# Patient Record
Sex: Female | Born: 1950
Health system: Southern US, Community
[De-identification: ages and names within clinical notes are randomized; demographics above are authoritative.]

## PROBLEM LIST (undated history)

## (undated) DIAGNOSIS — I10 Essential (primary) hypertension: Secondary | ICD-10-CM

## (undated) DIAGNOSIS — F32A Depression, unspecified: Secondary | ICD-10-CM

## (undated) DIAGNOSIS — D649 Anemia, unspecified: Secondary | ICD-10-CM

## (undated) DIAGNOSIS — M1990 Unspecified osteoarthritis, unspecified site: Secondary | ICD-10-CM

## (undated) DIAGNOSIS — K5900 Constipation, unspecified: Secondary | ICD-10-CM

## (undated) DIAGNOSIS — F419 Anxiety disorder, unspecified: Secondary | ICD-10-CM

## (undated) DIAGNOSIS — K219 Gastro-esophageal reflux disease without esophagitis: Secondary | ICD-10-CM

## (undated) DIAGNOSIS — F329 Major depressive disorder, single episode, unspecified: Secondary | ICD-10-CM

## (undated) HISTORY — PX: REFRACTIVE SURGERY: SHX103

## (undated) HISTORY — DX: Gastro-esophageal reflux disease without esophagitis: K21.9

## (undated) HISTORY — DX: Depression, unspecified: F32.A

## (undated) HISTORY — DX: Anxiety disorder, unspecified: F41.9

## (undated) HISTORY — DX: Essential (primary) hypertension: I10

## (undated) HISTORY — DX: Unspecified osteoarthritis, unspecified site: M19.90

## (undated) HISTORY — DX: Major depressive disorder, single episode, unspecified: F32.9

## (undated) HISTORY — DX: Constipation, unspecified: K59.00

---

## 2000-05-09 ENCOUNTER — Other Ambulatory Visit: Admission: RE | Admit: 2000-05-09 | Discharge: 2000-05-09 | Payer: Self-pay | Admitting: General Surgery

## 2000-06-27 ENCOUNTER — Encounter: Payer: Self-pay | Admitting: Obstetrics and Gynecology

## 2000-06-27 ENCOUNTER — Ambulatory Visit (HOSPITAL_COMMUNITY): Admission: RE | Admit: 2000-06-27 | Discharge: 2000-06-27 | Payer: Self-pay | Admitting: Obstetrics and Gynecology

## 2000-11-05 ENCOUNTER — Encounter: Payer: Self-pay | Admitting: Obstetrics and Gynecology

## 2000-11-05 ENCOUNTER — Ambulatory Visit (HOSPITAL_COMMUNITY): Admission: RE | Admit: 2000-11-05 | Discharge: 2000-11-05 | Payer: Self-pay | Admitting: Obstetrics and Gynecology

## 2000-11-12 ENCOUNTER — Other Ambulatory Visit: Admission: RE | Admit: 2000-11-12 | Discharge: 2000-11-12 | Payer: Self-pay | Admitting: Obstetrics and Gynecology

## 2001-03-20 ENCOUNTER — Other Ambulatory Visit: Admission: RE | Admit: 2001-03-20 | Discharge: 2001-03-20 | Payer: Self-pay | Admitting: General Surgery

## 2003-03-15 ENCOUNTER — Ambulatory Visit (HOSPITAL_COMMUNITY): Admission: RE | Admit: 2003-03-15 | Discharge: 2003-03-15 | Payer: Self-pay | Admitting: Obstetrics and Gynecology

## 2003-11-30 ENCOUNTER — Ambulatory Visit (HOSPITAL_COMMUNITY): Admission: RE | Admit: 2003-11-30 | Discharge: 2003-11-30 | Payer: Self-pay | Admitting: Obstetrics & Gynecology

## 2004-01-02 HISTORY — PX: COLON SURGERY: SHX602

## 2004-01-02 HISTORY — PX: COLONOSCOPY: SHX174

## 2004-01-27 ENCOUNTER — Ambulatory Visit (HOSPITAL_COMMUNITY): Admission: RE | Admit: 2004-01-27 | Discharge: 2004-01-27 | Payer: Self-pay | Admitting: Family Medicine

## 2004-02-15 ENCOUNTER — Ambulatory Visit: Payer: Self-pay | Admitting: Internal Medicine

## 2004-03-06 ENCOUNTER — Ambulatory Visit: Payer: Self-pay | Admitting: Internal Medicine

## 2004-03-06 ENCOUNTER — Ambulatory Visit (HOSPITAL_COMMUNITY): Admission: RE | Admit: 2004-03-06 | Discharge: 2004-03-06 | Payer: Self-pay | Admitting: Internal Medicine

## 2004-05-02 ENCOUNTER — Other Ambulatory Visit: Admission: RE | Admit: 2004-05-02 | Discharge: 2004-05-02 | Payer: Self-pay | Admitting: Obstetrics and Gynecology

## 2004-10-29 ENCOUNTER — Emergency Department (HOSPITAL_COMMUNITY): Admission: EM | Admit: 2004-10-29 | Discharge: 2004-10-29 | Payer: Self-pay | Admitting: Emergency Medicine

## 2004-11-01 ENCOUNTER — Ambulatory Visit (HOSPITAL_COMMUNITY): Admission: RE | Admit: 2004-11-01 | Discharge: 2004-11-01 | Payer: Self-pay | Admitting: Obstetrics & Gynecology

## 2004-11-02 ENCOUNTER — Inpatient Hospital Stay (HOSPITAL_COMMUNITY): Admission: AD | Admit: 2004-11-02 | Discharge: 2004-11-07 | Payer: Self-pay | Admitting: General Surgery

## 2004-11-03 ENCOUNTER — Encounter: Payer: Self-pay | Admitting: General Surgery

## 2004-12-04 ENCOUNTER — Inpatient Hospital Stay (HOSPITAL_COMMUNITY): Admission: RE | Admit: 2004-12-04 | Discharge: 2004-12-08 | Payer: Self-pay | Admitting: General Surgery

## 2004-12-04 ENCOUNTER — Encounter (INDEPENDENT_AMBULATORY_CARE_PROVIDER_SITE_OTHER): Payer: Self-pay | Admitting: General Surgery

## 2009-02-09 ENCOUNTER — Emergency Department (HOSPITAL_COMMUNITY): Admission: EM | Admit: 2009-02-09 | Discharge: 2009-02-09 | Payer: Self-pay | Admitting: Emergency Medicine

## 2010-01-21 ENCOUNTER — Encounter: Payer: Self-pay | Admitting: Obstetrics and Gynecology

## 2010-01-21 ENCOUNTER — Encounter: Payer: Self-pay | Admitting: Otolaryngology

## 2010-01-22 ENCOUNTER — Encounter: Payer: Self-pay | Admitting: Obstetrics and Gynecology

## 2010-03-13 ENCOUNTER — Encounter: Payer: Self-pay | Admitting: Orthopedic Surgery

## 2010-03-22 ENCOUNTER — Encounter: Payer: Self-pay | Admitting: Orthopedic Surgery

## 2010-03-29 ENCOUNTER — Ambulatory Visit: Payer: Self-pay | Admitting: Orthopedic Surgery

## 2010-03-30 ENCOUNTER — Ambulatory Visit: Payer: Self-pay | Admitting: Orthopedic Surgery

## 2010-05-19 NOTE — Consult Note (Signed)
NAME:  Alexandra Hoffman, Alexandra Hoffman                ACCOUNT NO.:  [id number]   MEDICAL RECORD NO.:  [id number]           PATIENT TYPE:   LOCATION:                                 FACILITY:   PHYSICIAN:  R. Roetta Sessions, M.D. DATE OF BIRTH:  [birth year]   DATE OF CONSULTATION:  DATE OF DISCHARGE:                                   CONSULTATION   REQUESTING PHYSICIAN:  Kirk Ruths, M.D.   REASON FOR CONSULTATION:  Colonoscopy.   HISTORY OF PRESENT ILLNESS:  Alexandra Hoffman is a 60 year old Caucasian female who  presents with a two-month history of left lower quadrant bloating and  pressure. She has history of chronic constipation and has always had to use  something for bowel movements. More recently, she is using milk of magnesia  every other day. She typically has a bowel movement between every one and  three days. She is also complaining of abdominal bloating and flatulence  which has been chronic problem for her as well. She denies any rectal  bleeding or melena. She notes her symptoms are typically worse  postprandially. She also describes worsening of her symptoms of bloating and  flatulence with particular foods including milk products. She has been tried  on Prevacid as it was felt her symptoms could be related to GERD; although  she underwent a three-month trial, it really did not change her symptoms.  Otherwise, she denies any heartburn, indigestion, nausea, vomiting,  dysphagia, or odynophagia.   PAST MEDICAL HISTORY:  She had EGD by Dr. Lovell Sheehan about five to six years  ago which was reportedly normal.   PAST SURGICAL HISTORY:  Denies.   CURRENT MEDICATIONS:  1.  Xanax 0.5 mg at bedtime.  2.  Milk of magnesia on a p.r.n. basis.   ALLERGIES:  No known drug allergies.   FAMILY HISTORY:  Denies any family history of colorectal carcinoma, liver,  or chronic GI problems. Both mother and father are deceased at age 61 and 91  with history significant for diabetes, coronary artery  disease,  hypertension, and brain cancer. She has multiple siblings, all of whom are  healthy.   SOCIAL HISTORY:  Alexandra Hoffman is currently in her second marriage for the last  10 years. She has two grown healthy children. She is employed full time as a  Producer, television/film/video. She denies any tobacco, alcohol, or drug use.   REVIEW OF SYSTEMS:  CONSTITUTIONAL:  She denies any fever or chills. Denies  any anorexia. She denies any fatigue, and her weight has remained stable.  GASTROINTESTINAL:  See HPI. GYNECOLOGICAL:  She has had one abnormal Pap  smear and is being followed by a gynecologist for this. She is  postmenopausal. ENDOCRINE:  She denies any history of diabetes mellitus or  thyroid disease.   PHYSICAL EXAMINATION:  VITAL SIGNS:  Weight 212.5 pounds, temperature 98.6,  blood pressure 140/100, pulse 60.  GENERAL:  Alexandra Hoffman is an obese, Caucasian female who is alert, oriented,  pleasant, cooperative in no acute distress.  HEENT:  Sclerae are clear, nonicteric. Conjunctivae pink. Oropharynx is pink  and moist without any lesions.  NECK:  Supple without any mass or thyromegaly.  CHEST:  Heart regular rate and rhythm with normal S1 and S2. Lungs clear to  auscultation bilaterally.  ABDOMEN:  Protuberant and obese with decreased bowel sounds x4, soft,  nontender, nondistended without palpable mass or hepatosplenomegaly. No  rebound tenderness or guarding, although exam is limited given patient's  body habitus.  RECTAL:  Deferred.  EXTREMITIES:  2+ pedal pulses bilaterally. No edema.  SKIN:  Pink, warm, and dry without any rash or jaundice.   IMPRESSION:  Alexandra Hoffman is a 60 year old Caucasian female with chronic  constipation along with some left lower quadrant abdominal pressure and  bloating over the last two months. She also notes her symptoms are worse  with milk products and may have a lactose intolerance. Not noted above, she  underwent CT scan of her abdomen with contrast on November 30, 2003; she was  found to have diverticulosis without evidence of acute diverticulitis.  Pelvic ultrasound from January 26 revealed normal endometrial stripe, 2.8 x  1.9 x 2.6 intramural fibroid, and otherwise normal exam.   She does have diverticulosis and possibly could have underlying  diverticulitis, although her symptoms are more along the lines of lactose  intolerance and possibly irritable bowel syndrome. Given her age,  colonoscopy should be undertaken to rule out colorectal carcinoma as well.   RECOMMENDATIONS:  1.  Will provide diverticulosis literature.  2.  Recommended fiber supplementation. We have given her samples of      Citrucel.  3.  Will schedule colonoscopy with Dr. Jena Gauss in the near future. I have      discussed this procedure including the risks and benefits which include      but are not limited to bleeding, infection, perforation, drug reaction.      She agrees with the plan.  4.  She should follow up with Dr. Regino Schultze regarding her high blood pressure      today which is 140/100.   We would like to thank Dr. Regino Schultze for allowing [place] to participate in the  care Alexandra Hoffman.     KC/MEDQ  D:  02/15/2004  T:  02/15/2004  Job:  [id number]   cc:   Kirk Ruths, M.D.  [address]  [place]  [place] [address]  Fax: [phone]

## 2010-05-19 NOTE — Discharge Summary (Signed)
NAME:  Alexandra Hoffman, Alexandra Hoffman                ACCOUNT NO.:  [id number]   MEDICAL RECORD NO.:  [id number]          PATIENT TYPE:  INP   LOCATION:  A336                          FACILITY:  [facility]   PHYSICIAN:  Dalia Heading, M.D.  DATE OF BIRTH:  [birth year]   DATE OF ADMISSION:  12/04/2004  DATE OF DISCHARGE:  12/08/2006LH                                 DISCHARGE SUMMARY   HOSPITAL COURSE SUMMARY:  The patient is a 60 year old white female who  presented to the operating room for a laparoscopic cholecystectomy and  partial colectomy. She suffered from chronic cholecystitis as well as a  history of perforated sigmoid diverticulitis. She underwent a laparoscopic  cholecystectomy and partial colectomy and partial sigmoid colectomy on  December 04, 2004. She tolerated both procedures well. Her postoperative  course was for the most part unremarkable. Her diet was advanced without  difficulty once her bowel function returned.   The patient is being discharged home on December 08, 2004 in good and  improving condition.   DISCHARGE INSTRUCTIONS:  The patient is to follow up with Dr. Franky Macho  on December 12, 2004. Discharge medications include Vicodin 1-2 tablets p.o.  q.4h. p.r.n. pain. She is resume all her other medications as previously  prescribed.   PRINCIPAL DIAGNOSIS:  1.  History of perforated sigmoid diverticulitis.  2.  Chronic cholecystitis.  3.  Reflux disease.   PRINCIPAL PROCEDURE:  Laparoscopic cholecystectomy, sigmoid colectomy on  December 04, 2004.      Dalia Heading, M.D.  Electronically Signed     MAJ/MEDQ  D:  12/08/2004  T:  12/08/2004  Job:  [id number]   cc:   Kirk Ruths, M.D.  Fax: [phone]   Tilda Burrow, M.D.  Fax: [phone]

## 2010-05-19 NOTE — Op Note (Signed)
NAME:  Alexandra Hoffman, Alexandra Hoffman                ACCOUNT NO.:  [id number]   MEDICAL RECORD NO.:  [id number]          PATIENT TYPE:  AMB   LOCATION:  DAY                           FACILITY:  [facility]   PHYSICIAN:  R. Roetta Sessions, M.D. DATE OF BIRTH:  11/23/1950   DATE OF PROCEDURE:  03/05/2004  DATE OF DISCHARGE:                                 OPERATIVE REPORT   PROCEDURE:  Colonoscopy, snare polypectomy with biopsy.   INDICATION FOR PROCEDURE:  The patient is a 59 year old lady with left lower  quadrant abdominal pain.  Symptoms have been much improved with a course of  Citrucel one dose daily.  She has never had a colonoscopy.  Colonoscopy is  now being done.  This approach has been discussed with the patient at  length, the potential risks, benefits, and alternatives have been reviewed,  questions answered.  She is agreeable.  Please see documentation in the  medical record.   PROCEDURE NOTE:  O2 saturation, blood pressure, pulse, and respiration were  monitored throughout the entire procedure.   CONSCIOUS SEDATION:  Versed 5 mg IV, Demerol 100 mg IV in divided doses.   INSTRUMENT USED:  Olympus video chip system.   FINDINGS:  Digital examination revealed no abnormalities.  Endoscopic  findings:  Prep was good.   Rectum:  Examination of the rectal mucosa including a retroflexed view of  the anal verge revealed no abnormalities.   Colon:  The colonic mucosa was surveyed from the rectosigmoid junction  through the left, transverse, right colon to the area of the appendiceal  orifice, ileocecal valve and cecum.  These structures were well-seen and  photographed for the record.  From this level the scope was slowly withdrawn  and all previous-mentioned mucosal surfaces were again seen.  There was a 4  mm sessile polyp at the base of the cecum which was cold biopsied/removed.  The patient also had sigmoid diverticula and a 7 mm angry pedunculated polyp  at 30 cm, which was removed with snare  cautery and recovered through the  scope.  The remainder of the colonic mucosa appeared normal.  The patient  tolerated the procedure well and was reacted in endoscopy.   IMPRESSION:  1.  Normal rectum and a few sigmoid diverticula.  2.  Pedunculated polyp at 30 cm, resected with snare.  3.  Polyp in the base of the cecum cold biopsied/removed.  4.  The remainder of the colonic mucosa appeared normal.   RECOMMENDATIONS:  1.  Continue Citrucel daily fiber supplement, as this seems to be really      helping this lady significant.  2.  Diverticulosis literature.  3.  No aspirin or arthritis medications for 10 days.  4.  Follow up on pathology.  5.  Further recommendations to follow.      RMR/MEDQ  D:  03/06/2004  T:  03/06/2004  Job:  [id number]   cc:   Kirk Ruths, M.D.  [address]  [place]  [place] [address]  Fax: [phone]

## 2010-05-19 NOTE — Discharge Summary (Signed)
NAME:  Alexandra Hoffman, Alexandra Hoffman                ACCOUNT NO.:  [id number]   MEDICAL RECORD NO.:  [id number]          PATIENT TYPE:  INP   LOCATION:  A322                          FACILITY:  [facility]   PHYSICIAN:  Dalia Heading, M.D.  DATE OF BIRTH:  16-Jan-1950   DATE OF ADMISSION:  11/02/2004  DATE OF DISCHARGE:  11/07/2006LH                                 DISCHARGE SUMMARY   HOSPITAL COURSE:  The patient is a 60 year old, white female who was  admitted to [facility] for treatment of pelvic abscess secondary to  perforated sigmoid diverticulitis.  She was started on Zosyn and Flagyl.  She subsequently underwent CT-guided drainage of the pelvic abscess on  November 03, 2004.  A drain was left in place.  Her white count subsequently  improved.  A follow-up CAT scan was done on November 07, 2004, and her  abscessed cavity is almost resolved.  Her diverticulitis also looks  improved.  The patient is tolerating diet well.  The drain was removed and  the patient is being discharged home in good and improving condition.   FOLLOW UP:  The patient to follow-up with Dr. Franky Macho on November 14, 2004.   DISCHARGE MEDICATIONS:  1.  Augmentin 875 mg p.o. b.i.d. x1 week.  2.  Flagyl 250 mg p.o. t.i.d. x1 week.  3.  She is to resume other medications as previously prescribed.   DISCHARGE DIAGNOSES:  1.  Pelvic abscess.  2.  Perforated sigmoid diverticulitis.  3.  History of anxiety disorder.   PRINCIPAL PROCEDURE:  None.      Dalia Heading, M.D.  Electronically Signed     MAJ/MEDQ  D:  11/07/2004  T:  11/07/2004  Job:  [id number]   cc:   Tilda Burrow, M.D.  Fax: [phone]   Kirk Ruths, M.D.  Fax: [phone]

## 2010-05-19 NOTE — Op Note (Signed)
NAME:  Alexandra Hoffman, Alexandra Hoffman                ACCOUNT NO.:  [id number]   MEDICAL RECORD NO.:  [id number]          PATIENT TYPE:  INP   LOCATION:  A336                          FACILITY:  [facility]   PHYSICIAN:  Dalia Heading, M.D.  DATE OF BIRTH:  1950-04-24   DATE OF PROCEDURE:  12/04/2004  DATE OF DISCHARGE:                                 OPERATIVE REPORT   PREOPERATIVE DIAGNOSES:  1.  Chronic cholecystitis.  2.  Sigmoid diverticulitis.   POSTOPERATIVE DIAGNOSES:  1.  Chronic cholecystitis.  2.  Sigmoid diverticulitis.   PROCEDURE:  Partial colectomy, laparoscopic cholecystectomy.   SURGEON:  Franky Macho, M.D.   ASSISTANT:  Arna Snipe, M.D.   ANESTHESIA:  General endotracheal.   INDICATIONS:  The patient is a 60 year old white female who presents with  history of a perforated sigmoid diverticulitis as well as chronic  cholecystitis.  Her intra-abdominal abscess was drained percutaneously  approximately six weeks ago. The patient now comes to the operating for both  a partial sigmoid colectomy as well as laparoscopic cholecystectomy for  chronic cholecystitis.  The risks and benefits of the procedures including  bleeding, infection, hepatobiliary, cardiopulmonary difficulties,  possibility of a blood transfusion were fully explained to the patient who  gave informed consent.   DESCRIPTION OF PROCEDURE:  The patient was placed in the supine position.  After induction of general endotracheal anesthesia, the abdomen and perineum  were prepped and draped using the usual sterile technique with Betadine.  Surgical site confirmation was performed.   We first performed a laparoscopic cholecystectomy.  A supraumbilical  incision was made down to the fascia.  A Veress needle was introduced into  the abdominal cavity,  and confirmation of placement was done using the  saline drop test. The abdomen was then insufflated to 16 mmHg pressure.  An  11 mm trocar was introduced into the  abdominal cavity under direct  visualization without difficulty.  The patient was placed in reversed  Trendelenburg position. An additional 11-mm trocar was placed in suprapubic  region and 5-mm trocars was placed in the right upper quadrant and right  flank regions. The liver was inspected and noted to be within normal limits.  The gallbladder was retracted superior laterally.  The dissection was begun  around the infundibulum of the gallbladder.  The cystic duct was first  identified.  Its juncture to the infundibulum fully identified.  The  Endoclips were placed proximally and distally on cystic duct, and the cystic  duct was divided.  This likewise was done to the cystic artery.  The  gallbladder was then freed away from the gallbladder fossa using Bovie  electrocautery.  The gallbladder was delivered through the epigastric trocar  site using the EndoCatch bag.  The gallbladder fossa was inspected.  No  abnormal bleeding or bile leakage was noted. Surgicel was placed in the  gallbladder fossa.  All fluid and air were then evacuated from the abdominal  cavity prior to removal of the trocars.   Next, a lower midline incision was made to proceed with the sigmoid  colectomy. The dissection  was taken down to the fascia, and the peritoneal  cavity was entered into without difficulty.  On inspection, a redundant area  of distal sigmoid colon was noted to be adhesed to the area between the  uterus and bladder wall.  This was freed away bluntly without difficulty.  The mesentery of the sigmoid colon was mobilized down to the colorectal  region.  The peritoneal reflection was likewise dissected up to the splenic  flexure.  A GIA stapler was placed across the distal sigmoid colon and  fired.  This was likewise done to the proximal sigmoid colon.  A suture was  placed to mark the proximal sigmoid colon region.  The mesentery then was  divided using 2-0 silk ties.  The specimens then removed from  the operative  field.  A side-to-side colorectal anastomosis was then performed using a GIA  stapler.  The staple line of the enterotomy colotomy was closed using a TA  66 stapler.  The staple line was bolstered using 3-0 silk Lembert sutures.  Excess adipose tissue was also used to cover the anastomosis.  A widely  patent anastomosis was found.  Care was taken to avoid the left ureter.  The  abdominal cavity was then copiously irrigated with normal saline.  Surgicel  was placed where the was raw  tissue where the previous perforation had  occurred.  The abdominal cavity contents were then returned into the  abdominal fat cavity in an orderly fashion.   The fascia was reapproximated using a looped O Novofil running suture.  Subcutaneous layer was reapproximated using 2-0 Vicryl interrupted sutures.  The subcutaneous layer was copiously irrigated normal saline. The skin was  closed using staples. Betadine ointment and dry sterile dressings were  applied.   All tape and needle counts were correct and the end of the procedure.  The  patient was extubated in the upper room and went back to recovery room awake  in stable condition.   COMPLICATIONS:  None.   SPECIMEN:  1.  Gallbladder.  2.  Sigmoid colon suture proximal.   ESTIMATED BLOOD LOSS:  100 mL.      Dalia Heading, M.D.  Electronically Signed     MAJ/MEDQ  D:  12/04/2004  T:  12/05/2004  Job:  [id number]   cc:   Tilda Burrow, M.D.  Fax: [phone]   Kirk Ruths, M.D.  Fax: [phone]

## 2010-05-19 NOTE — H&P (Signed)
NAME:  Alexandra Hoffman, Alexandra Hoffman                ACCOUNT NO.:  [id number]   MEDICAL RECORD NO.:  [id number]          PATIENT TYPE:  INP   LOCATION:  A322                          FACILITY:  [facility]   PHYSICIAN:  Dalia Heading, M.D.  DATE OF BIRTH:  1950-06-17   DATE OF ADMISSION:  11/02/2004  DATE OF DISCHARGE:  LH                                HISTORY & PHYSICAL   CHIEF COMPLAINT:  Diverticular abscess.   HISTORY OF PRESENT ILLNESS:  The patient is a 60 year old white female who  has a known history of diverticular disease who presented to Dr. Rayna Sexton  office two weeks ago with symptoms of urinary tract infection.  She was  started on appropriate antibiotics.  I saw her in my office earlier this  week.  She was complaining of epigastric discomfort.  She has a known  history of chronic cholecystitis with a low gallbladder ejection fraction.  At that time, I felt that we needed to wait until her urinary tract symptoms  resolved.  She saw Dr. Despina Hidden yesterday who on pelvic examination thought that  she had fullness in the pelvis.  An ultrasound and a CT scan of the abdomen  and pelvis were performed which revealed evidence of multiple pelvic  abscesses consistent with perforated diverticulosis.  She did not have  peritoneal signs.   I saw her in my office today, and I have recommended that she be admitted to  the hospital for intravenous antibiotic therapy and CT-guided drainage of  the pelvic abscesses.   PAST MEDICAL HISTORY:  1.  Diverticulosis.  2.  Chronic dyspepsia.   PAST SURGICAL HISTORY:  Colonoscopy by Dr. Jena Gauss earlier this year which  showed several sigmoid diverticula.  Polypectomy was done at the base of the  cecum which was negative for malignancy.  Another polyp was located at 30 cm  from the anus, and this was also negative for malignancy.   CURRENT MEDICATIONS:  1.  Ciprofloxacin.  2.  Prevacid.  3.  Xanax.   ALLERGIES:  NO KNOWN DRUG ALLERGIES.   REVIEW OF SYSTEMS:   Noncontributory.   PHYSICAL EXAMINATION:  GENERAL:  The patient is a well-developed, well-  nourished white female in no acute distress.  LUNGS:  Clear to auscultation with equal breath sounds bilaterally.  HEART:  Regular rate and rhythm without S3, S4, or murmurs.  ABDOMEN:  Soft, nontender, nondistended.  No hepatosplenomegaly or masses  were noted.  Minimal discomfort is noted in the suprapubic region.  It is  not exquisitely tender.  The patient describes it just sort of as  intermittent crampy abdominal pain.   LABORATORY DATA:  White blood cell count done as an outpatient yesterday was  10.9, hematocrit 43, platelet count 308.  MET7 is within normal limits.  Liver enzyme tests were within normal limits.   IMPRESSION:  Pelvic abscesses secondary to sigmoid diverticular disease.   PLAN:  The patient is being admitted to [facility] for intravenous  antibiotic therapy.  She will be transferred to [facility]  tomorrow to undergo a CT-guided drainage of  these abscesses.  It has been  explained that if these cannot be drained or do not resolve, surgical  intervention may be warranted.  Further management is pending the CT  drainage results.      Dalia Heading, M.D.  Electronically Signed     MAJ/MEDQ  D:  11/02/2004  T:  11/02/2004  Job:  [id number]   cc:   Kirk Ruths, M.D.  Fax: [phone]   Tilda Burrow, M.D.  Fax: [phone]

## 2010-05-19 NOTE — H&P (Signed)
NAME:  Alexandra Hoffman, Alexandra Hoffman                ACCOUNT NO.:  [id number]   MEDICAL RECORD NO.:  [id number]          PATIENT TYPE:  AMB   LOCATION:                                FACILITY:  [facility]   PHYSICIAN:  Dalia Heading, M.D.  DATE OF BIRTH:  Mar 20, 1950   DATE OF ADMISSION:  DATE OF DISCHARGE:  LH                                HISTORY & PHYSICAL   CHIEF COMPLAINT:  Chronic cholecystitis and perforated sigmoid  diverticulitis.   HISTORY OF PRESENT ILLNESS:  The patient is a 60 year old white female  status post CT-guided drainage of a pelvic abscess due to perforated sigmoid  diverticulitis who now presents for a partial colectomy and cholecystectomy.  Her initial drainage was done on November 03, 2004.  She has done well since  that time.  She had a colonoscopy earlier this year by Dr. Jena Gauss, which  revealed several sigmoid diverticula.  She denies any fever, chills,  diarrhea or constipation.   PAST MEDICAL HISTORY:  As noted above.   PAST SURGICAL HISTORY:  As noted above.   CURRENT MEDICATIONS:  Xanax and Prevacid.   ALLERGIES:  No known drug allergies.   REVIEW OF SYSTEMS:  Noncontributory.   PHYSICAL EXAMINATION:  GENERAL APPEARANCE:  The patient is a well-developed,  well-nourished white female in no acute distress.  LUNGS:  Clear to auscultation with equal breath sounds bilaterally.  HEART:  Regular rate and rhythm without S3, S4 or murmurs.  ABDOMEN:  Soft, nontender and nondistended.  No hepatosplenomegaly or masses  noted.   IMPRESSION:  1.  Sigmoid diverticulitis, with history of perforation.  2.  Chronic cholecystitis.   PLAN:  The patient is scheduled to undergo a partial colectomy and  cholecystectomy on November 27, 2004.  The risks and benefits of both  procedures, including bleeding, infection, hepatobiliary injury, the  possibility of a blood transfusion and the possibility of a colostomy, were  fully explained to the patient, who gave informed  consent.      Dalia Heading, M.D.  Electronically Signed     MAJ/MEDQ  D:  11/14/2004  T:  11/14/2004  Job:  [id number]   cc:   Kirk Ruths, M.D.  Fax: [phone]   Tilda Burrow, M.D.  Fax: [phone]

## 2011-07-09 ENCOUNTER — Other Ambulatory Visit: Payer: Self-pay | Admitting: Oncology

## 2012-08-22 ENCOUNTER — Telehealth: Payer: Self-pay | Admitting: Radiation Oncology

## 2012-08-22 NOTE — Telephone Encounter (Signed)
Opened in error

## 2015-06-15 ENCOUNTER — Other Ambulatory Visit (HOSPITAL_COMMUNITY): Payer: Self-pay | Admitting: Internal Medicine

## 2015-06-15 DIAGNOSIS — Z1231 Encounter for screening mammogram for malignant neoplasm of breast: Secondary | ICD-10-CM

## 2015-06-20 ENCOUNTER — Ambulatory Visit (HOSPITAL_COMMUNITY): Payer: Self-pay

## 2016-02-06 ENCOUNTER — Encounter: Payer: Self-pay | Admitting: Family Medicine

## 2016-02-06 ENCOUNTER — Encounter (INDEPENDENT_AMBULATORY_CARE_PROVIDER_SITE_OTHER): Payer: Self-pay

## 2016-02-06 ENCOUNTER — Ambulatory Visit (INDEPENDENT_AMBULATORY_CARE_PROVIDER_SITE_OTHER): Payer: Medicare Other | Admitting: Family Medicine

## 2016-02-06 VITALS — BP 130/84 | HR 72 | Temp 96.5°F | Resp 16 | Ht 65.5 in | Wt 197.1 lb

## 2016-02-06 DIAGNOSIS — Z9109 Other allergy status, other than to drugs and biological substances: Secondary | ICD-10-CM

## 2016-02-06 DIAGNOSIS — Z23 Encounter for immunization: Secondary | ICD-10-CM | POA: Diagnosis not present

## 2016-02-06 DIAGNOSIS — K635 Polyp of colon: Secondary | ICD-10-CM

## 2016-02-06 DIAGNOSIS — M170 Bilateral primary osteoarthritis of knee: Secondary | ICD-10-CM

## 2016-02-06 DIAGNOSIS — K219 Gastro-esophageal reflux disease without esophagitis: Secondary | ICD-10-CM | POA: Diagnosis not present

## 2016-02-06 DIAGNOSIS — I10 Essential (primary) hypertension: Secondary | ICD-10-CM | POA: Insufficient documentation

## 2016-02-06 DIAGNOSIS — F1320 Sedative, hypnotic or anxiolytic dependence, uncomplicated: Secondary | ICD-10-CM | POA: Insufficient documentation

## 2016-02-06 DIAGNOSIS — F418 Other specified anxiety disorders: Secondary | ICD-10-CM

## 2016-02-06 HISTORY — DX: Bilateral primary osteoarthritis of knee: M17.0

## 2016-02-06 MED ORDER — TRIAMCINOLONE ACETONIDE 55 MCG/ACT NA AERO: 2.0000 | INHALATION_SPRAY | Freq: Every day | NASAL | 12 refills | Status: DC

## 2016-02-06 MED ORDER — FLUOXETINE HCL 10 MG PO CAPS: 10.0000 mg | ORAL_CAPSULE | Freq: Every day | ORAL | 3 refills | Status: DC

## 2016-02-06 MED ORDER — ESOMEPRAZOLE MAGNESIUM 20 MG PO CPDR: 20.0000 mg | DELAYED_RELEASE_CAPSULE | Freq: Every day | ORAL | 3 refills | Status: DC

## 2016-02-06 NOTE — Progress Notes (Signed)
Chief Complaint  Patient presents with  . Establish Care   Patient is new to establish. No old records are available. She has well-controlled hypertension. Takes lisinopril 10 mg a day. She has osteoarthritis of both knees. This limits her ability to walk. She is only able to work 3 days a week. She is under the care of an orthopedist in [place]. She takes Mobic 15 mg a day with food. She states eventually she will need a knee replacement She has a history of GERD. She takes Nexium over-the-counter. This controls her symptoms well. She has never had an EGD. Patient states she had a colonoscopy in 2006. She has a partial colectomy for multiple polyps. She states the polyps were all benign. She has not had a colonoscopy since this time, and realizes that she is overdue. She has not had a mammogram or Pap smear in over 10 years. She has a history of anxiety and depression. She is been on Xanax since she was in her 41s. We discussed that this was not a good long-term medication and that I believe she needs to be weaned off of it. She doesn't understand why, states that it helps her "nerves". She is willing to go on an SSRI medication and try to taper off the Xanax. She is told that I will not provide her long-term Xanax. If she is unable to tolerate a taper she will be referred to psychology. She has environmental allergies. A lot of postnasal drip. She is given a prescription for Nasacort. She tried and failed Zyrtec  Patient Active Problem List   Diagnosis Date Noted  . Essential hypertension 02/06/2016  . GERD without esophagitis 02/06/2016  . Benign colon polyp 02/06/2016  . Anxiety with depression 02/06/2016  . Osteoarthritis of both knees 02/06/2016  . Benzodiazepine dependence, continuous ([facility]) 02/06/2016    Outpatient Encounter Prescriptions as of 02/06/2016  Medication Sig  . ALPRAZolam (XANAX) 0.5 MG tablet Take 0.5 mg by mouth at bedtime.  Marland Kitchen lisinopril (PRINIVIL,ZESTRIL) 10 MG  tablet Take 5 mg by mouth 2 (two) times daily.  . meloxicam (MOBIC) 15 MG tablet Take 15 mg by mouth daily.  Marland Kitchen esomeprazole (NEXIUM) 20 MG capsule Take 1 capsule (20 mg total) by mouth daily at 12 noon.  Marland Kitchen FLUoxetine (PROZAC) 10 MG capsule Take 1 capsule (10 mg total) by mouth daily.  Marland Kitchen triamcinolone (NASACORT AQ) 55 MCG/ACT AERO nasal inhaler Place 2 sprays into the nose daily.   No facility-administered encounter medications on file as of 02/06/2016.     Past Medical History:  Diagnosis Date  . Anxiety   . Arthritis    knees  . Depression   . GERD (gastroesophageal reflux disease)   . Hypertension     Past Surgical History:  Procedure Laterality Date  . COLON SURGERY  2006   polyps    Social History   Social History  . Marital status: Widowed    Spouse name: N/A  . Number of children: 2  . Years of education: 10   Occupational History  . hair dresser     self   Social History Main Topics  . Smoking status: Never Smoker  . Smokeless tobacco: Never Used  . Alcohol use No  . Drug use: No  . Sexual activity: Not Currently    Birth control/ protection: Post-menopausal   Other Topics Concern  . Not on file   Social History Narrative   Lives alone   Widow   2  grown daughters, 5 grandchildren.   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 Arthritis Mother   . Depression Mother   . Diabetes Mother   . Heart disease Mother   . Hypertension Mother   . Stroke Mother   . Kidney disease Mother 58    dialysis  . Alcohol abuse Father   . Arthritis Father   . Cancer Father 16    brain  . Hypertension Father   . Hypertension Sister   . Arthritis Sister   . Hypertension Brother   . Arthritis Brother   . Hypertension Brother   . Arthritis Brother   . Hypertension Brother   . Hypertension Brother   . Hypertension Brother   . Hypertension Brother   . Other Brother     MVA    Review of Systems  Constitutional: Negative  for chills, fever and weight loss.  HENT: Positive for congestion. Negative for hearing loss.   Eyes: Negative for blurred vision and pain.  Respiratory: Negative for cough and shortness of breath.   Cardiovascular: Negative for chest pain and leg swelling.  Gastrointestinal: Positive for heartburn. Negative for abdominal pain, constipation and diarrhea.  Genitourinary: Negative for dysuria and frequency.  Musculoskeletal: Positive for joint pain. Negative for falls and myalgias.  Neurological: Negative for dizziness, seizures and headaches.  Psychiatric/Behavioral: Negative for depression. The patient is nervous/anxious and has insomnia.     BP 130/84 (BP Location: Right Arm, Patient Position: Sitting, Cuff Size: Normal)   Pulse 72   Temp (!) 96.5 F (35.8 C) (Temporal)   Resp 16   Ht 5' 5.5" (1.664 m)   Wt 197 lb 1.9 oz (89.4 kg)   LMP 01/02/2004 (Approximate)   SpO2 98%   BMI 32.30 kg/m   Physical Exam  Constitutional: She is oriented to person, place, and time. She appears well-developed and well-nourished.  HENT:  Head: Normocephalic and atraumatic.  Right Ear: External ear normal.  Left Ear: External ear normal.  Mouth/Throat: Oropharynx is clear and moist.  Teeth well restored  Eyes: Conjunctivae are normal. Pupils are equal, round, and reactive to light.  Glasses  Neck: Normal range of motion. Neck supple. No thyromegaly present.  Cardiovascular: Normal rate, regular rhythm and normal heart sounds.   Pulmonary/Chest: Effort normal and breath sounds normal. No respiratory distress.  Abdominal: Soft. Bowel sounds are normal.  Musculoskeletal: Normal range of motion. She exhibits no edema.  Both knees with crepitus  Lymphadenopathy:    She has no cervical adenopathy.  Neurological: She is alert and oriented to person, place, and time.  Gait normal  Skin: Skin is warm and dry.  Psychiatric: She has a normal mood and affect. Her behavior is normal. Thought content  normal.  Nursing note and vitals reviewed.  ASSESSMENT/PLAN:  1. Need for influenza vaccination  - Flu Vaccine QUAD 36+ mos IM  2. Essential hypertension   3. GERD without esophagitis   4. Benign colon polyp   5. Anxiety with depression   6. Primary osteoarthritis of both knees   7. Environmental allergies   8. Benzodiazepine dependence, continuous ([facility]) 60 minutes was spent with this patient. Much of it in discussing depression, anxiety, and treatment. Self health is reviewed. The need to get off of benzodiazepines, and slow taper discussed. Also planning for future visits.  Patient Instructions  Need old records Continue current medicines Stay as active as you can manage  Need  to come back for a physical Need mammogram and colonoscopy Blood work next time  Take the fluoxetine once a day  After two weeks go to twice a day Stay on 20 mg a day until you see me in March    Raylene Everts, MD

## 2016-02-06 NOTE — Patient Instructions (Addendum)
Need old records Continue current medicines Stay as active as you can manage  Need to come back for a physical Need mammogram and colonoscopy Blood work next time  Take the fluoxetine once a day  After two weeks go to twice a day Stay on 20 mg a day until you see me in March

## 2016-02-07 ENCOUNTER — Ambulatory Visit: Payer: Self-pay | Admitting: Family Medicine

## 2016-03-05 ENCOUNTER — Encounter: Payer: Medicare Other | Admitting: Family Medicine

## 2016-03-29 ENCOUNTER — Telehealth: Payer: Self-pay | Admitting: Family Medicine

## 2016-05-02 ENCOUNTER — Telehealth: Payer: Self-pay

## 2016-05-02 NOTE — Telephone Encounter (Signed)
Called to schedule awv with krystal - anr

## 2016-08-21 ENCOUNTER — Encounter (INDEPENDENT_AMBULATORY_CARE_PROVIDER_SITE_OTHER): Payer: Self-pay | Admitting: *Deleted

## 2016-10-03 NOTE — Telephone Encounter (Signed)
Issue resolved.

## 2016-11-09 ENCOUNTER — Other Ambulatory Visit (HOSPITAL_COMMUNITY): Payer: Self-pay | Admitting: Internal Medicine

## 2016-11-09 DIAGNOSIS — Z780 Asymptomatic menopausal state: Secondary | ICD-10-CM

## 2016-11-13 ENCOUNTER — Other Ambulatory Visit (HOSPITAL_COMMUNITY): Payer: Self-pay | Admitting: Internal Medicine

## 2016-11-13 DIAGNOSIS — Z1231 Encounter for screening mammogram for malignant neoplasm of breast: Secondary | ICD-10-CM

## 2016-11-15 ENCOUNTER — Other Ambulatory Visit (HOSPITAL_COMMUNITY): Payer: Medicare Other

## 2016-11-15 ENCOUNTER — Ambulatory Visit (HOSPITAL_COMMUNITY)
Admission: RE | Admit: 2016-11-15 | Discharge: 2016-11-15 | Disposition: A | Discharge status: Home / Self Care | Payer: Medicare Other | Source: Ambulatory Visit | Attending: Internal Medicine | Admitting: Internal Medicine

## 2016-11-15 ENCOUNTER — Ambulatory Visit (HOSPITAL_COMMUNITY): Payer: Medicare Other

## 2016-11-15 ENCOUNTER — Encounter (HOSPITAL_COMMUNITY): Payer: Self-pay

## 2016-11-15 DIAGNOSIS — Z780 Asymptomatic menopausal state: Secondary | ICD-10-CM

## 2016-11-26 ENCOUNTER — Telehealth: Payer: Self-pay | Admitting: Internal Medicine

## 2016-11-26 NOTE — Telephone Encounter (Signed)
[phone] PATIENT RECEIVED LETTER TO SCHEDULE TCS. NO CURRENT GI ISSUES OR HEART ATTACKS, NO BLOOD THINNER

## 2016-12-04 NOTE — Telephone Encounter (Signed)
LMOM to call.

## 2016-12-06 ENCOUNTER — Telehealth: Payer: Self-pay

## 2016-12-06 NOTE — Telephone Encounter (Signed)
She had a partial colectomy. She takes Xanax in the evening. I think she could best be served with Propofol.

## 2016-12-06 NOTE — Telephone Encounter (Signed)
See separate triage.  

## 2016-12-06 NOTE — Telephone Encounter (Addendum)
Gastroenterology Pre-Procedure Review  Request Date: 12/06/2016 Requesting Physician: Dr. Wende Neighbors  PATIENT REVIEW QUESTIONS: The patient responded to the following health history questions as indicated:    Last colonoscopy by Dr. Gala Romney 03/05/2004  Pt said she had part of her colon removed after the last colonoscopy See Dr. Arnoldo Morale report  1. Diabetes Melitis: no 2. Joint replacements in the past 12 months: no 3. Major health problems in the past 3 months: no 4. Has an artificial valve or MVP: no 5. Has a defibrillator: no 6. Has been advised in past to take antibiotics in advance of a procedure like teeth cleaning: no 7. Family history of colon cancer: no  8. Alcohol Use: no 9. History of sleep apnea: no  10. History of coronary artery or other vascular stents placed within the last 12 months: no 11. History of any prior anesthesia complications: no    MEDICATIONS & ALLERGIES:    Patient reports the following regarding taking any blood thinners:   Plavix? no Aspirin? no Coumadin? no Brilinta? no Xarelto? no Eliquis? no Pradaxa? no Savaysa? no Effient? no  Patient confirms/reports the following medications:  Current Outpatient Medications  Medication Sig Dispense Refill  . ALPRAZolam (XANAX) 0.5 MG tablet Take 0.5 mg by mouth at bedtime.    . cyanocobalamin 1000 MCG tablet Take 1,000 mcg by mouth daily.    Marland Kitchen esomeprazole (NEXIUM) 20 MG capsule Take 1 capsule (20 mg total) by mouth daily at 12 noon. 90 capsule 3  . lisinopril (PRINIVIL,ZESTRIL) 10 MG tablet Take 10 mg by mouth daily.     . meloxicam (MOBIC) 15 MG tablet Take 15 mg by mouth daily.    . Omega-3 Fatty Acids (FISH OIL) 1000 MG CAPS Take by mouth daily.     No current facility-administered medications for this visit.     Patient confirms/reports the following allergies:  No Known Allergies  No orders of the defined types were placed in this encounter.   AUTHORIZATION INFORMATION Primary Insurance:   ID  #:   Group #:  Pre-Cert / Auth required: Pre-Cert / Auth #:   Secondary Insurance:   ID #:   Group #:  Pre-Cert / Auth required:  Pre-Cert / Auth #:   SCHEDULE INFORMATION: Procedure has been scheduled as follows:  Date:                     Time:   Location:   This Gastroenterology Pre-Precedure Review Form is being routed to the following provider(s): R. Garfield Cornea, MD

## 2016-12-06 NOTE — Telephone Encounter (Signed)
Patient was returning DS call to schedule her colonoscopy. Please call her back at [phone]

## 2016-12-06 NOTE — Telephone Encounter (Signed)
Pt has OV appt with Roseanne Kaufman, NP on 01/31/2017 at 8:00 AM.

## 2017-01-31 ENCOUNTER — Ambulatory Visit: Payer: Medicare Other | Admitting: Gastroenterology

## 2017-01-31 ENCOUNTER — Encounter: Payer: Self-pay | Admitting: Gastroenterology

## 2017-01-31 ENCOUNTER — Encounter: Payer: Self-pay | Admitting: *Deleted

## 2017-01-31 ENCOUNTER — Other Ambulatory Visit: Payer: Self-pay | Admitting: *Deleted

## 2017-01-31 ENCOUNTER — Telehealth: Payer: Self-pay | Admitting: *Deleted

## 2017-01-31 VITALS — BP 126/73 | HR 74 | Temp 97.5°F | Ht 66.0 in | Wt 181.0 lb

## 2017-01-31 DIAGNOSIS — K5900 Constipation, unspecified: Secondary | ICD-10-CM

## 2017-01-31 DIAGNOSIS — Z86010 Personal history of colonic polyps: Secondary | ICD-10-CM

## 2017-01-31 MED ORDER — PEG 3350-KCL-NA BICARB-NACL 420 G PO SOLR: 4000.0000 mL | Freq: Once | ORAL | 0 refills | Status: AC

## 2017-01-31 NOTE — Progress Notes (Signed)
cc'ed to pcp °

## 2017-01-31 NOTE — Progress Notes (Signed)
Primary Care Physician:  Celene Squibb, MD Primary Gastroenterologist:  Dr. Gala Romney   Chief Complaint  Patient presents with  . Colonoscopy    consult    HPI:   Alexandra Hoffman is a 67 y.o. female presenting today at the request of Dr. Nevada Crane to arrange surveillance colonoscopy. Last colonoscopy by Dr. Gala Romney in 2006 with  normal rectum and a few sigmoid diverticula, 7 mm angry pedunculated polyp at 30 cm. 4 mm sessile polyp base of cecum. I do not have the path available. She underwent a partial colectomy later that year by Dr. Arnoldo Morale due to perforated sigmoid diverticulitis. She was brought into the office today to arrange colonoscopy as it was felt she may be best served with Propofol.   Takes Miralax twice a week to help with constipation. Hates drinking the Miralax. Chronic constipation since her 55s. No rectal bleeding. Would like to try a different agent for constipation. No upper GI symptoms. Nexium only as needed. On omnicef for sinus issues currently. Xanax one each evening.    Past Medical History:  Diagnosis Date  . Anxiety   . Arthritis    knees  . Constipation   . Depression   . GERD (gastroesophageal reflux disease)   . Hypertension     Past Surgical History:  Procedure Laterality Date  . COLON SURGERY  2006   perforated sigmoid diverticulitis, Dr Arnoldo Morale  . COLONOSCOPY  2006   Dr. Gala Romney: normal rectum and a few sigmoid diverticula, 7 mm angry pedunculated polyp at 30 cm, 4 mm sessile polyp base of cecum. Unknown path     Current Outpatient Medications  Medication Sig Dispense Refill  . ALPRAZolam (XANAX) 0.5 MG tablet Take 0.5 mg by mouth at bedtime.    . cefdinir (OMNICEF) 300 MG capsule Take 300 mg by mouth 2 (two) times daily.    . cyanocobalamin 1000 MCG tablet Take 1,000 mcg by mouth daily.    Marland Kitchen esomeprazole (NEXIUM) 20 MG capsule Take 1 capsule (20 mg total) by mouth daily at 12 noon. 90 capsule 3  . guaiFENesin (MUCINEX) 600 MG 12 hr tablet Take 600  mg by mouth daily.    Marland Kitchen lisinopril (PRINIVIL,ZESTRIL) 10 MG tablet Take 10 mg by mouth daily.     . meloxicam (MOBIC) 15 MG tablet Take 15 mg by mouth daily.    . Omega-3 Fatty Acids (FISH OIL) 1000 MG CAPS Take by mouth daily.     No current facility-administered medications for this visit.     Allergies as of 01/31/2017  . (No Known Allergies)    Family History  Problem Relation Age of Onset  . Arthritis Mother   . Depression Mother   . Diabetes Mother   . Heart disease Mother   . Hypertension Mother   . Stroke Mother   . Kidney disease Mother 9       dialysis  . Alcohol abuse Father   . Arthritis Father   . Cancer Father 18       brain  . Hypertension Father   . Hypertension Sister   . Arthritis Sister   . Hypertension Brother   . Arthritis Brother   . Hypertension Brother   . Arthritis Brother   . Hypertension Brother   . Hypertension Brother   . Hypertension Brother   . Hypertension Brother   . Other Brother        MVA  . Colon cancer Neg Hx  Social History   Socioeconomic History  . Marital status: Widowed    Spouse name: Not on file  . Number of children: 2  . Years of education: 10  . Highest education level: Not on file  Social Needs  . Financial resource strain: Not on file  . Food insecurity - worry: Not on file  . Food insecurity - inability: Not on file  . Transportation needs - medical: Not on file  . Transportation needs - non-medical: Not on file  Occupational History  . 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 Smoking status: Never Smoker  . Smokeless tobacco: Never Used  Substance and Sexual Activity  . Alcohol use: No  . Drug use: No  . Sexual activity: Not Currently    Birth control/protection: Post-menopausal  Other Topics Concern  . Not on file  Social History Narrative   Lives alone   Widow   2 grown daughters, 5 grandchildren.   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  CV: Denies chest pain, heart palpitations, peripheral edema, syncope.  Resp: Denies shortness of breath at rest or with exertion. Denies wheezing or cough.  GI: see HPI  GU : Denies urinary burning, urinary frequency, urinary hesitancy MS: Denies joint pain, muscle weakness, cramps, or limitation of movement.  Derm: Denies rash, itching, dry skin Psych: Denies depression, anxiety, memory loss, and confusion Heme: Denies bruising, bleeding, and enlarged lymph nodes.  Physical Exam: BP 126/73   Pulse 74   Temp (!) 97.5 F (36.4 C) (Oral)   Ht 5\' 6"  (1.676 m)   Wt 181 lb (82.1 kg)   LMP 01/02/2004 (Approximate)   BMI 29.21 kg/m  General:   Alert and oriented. Pleasant and cooperative. Well-nourished and well-developed.  Head:  Normocephalic and atraumatic. Eyes:  Without icterus, sclera clear and conjunctiva pink.  Ears:  Normal auditory acuity. Nose:  No deformity, discharge,  or lesions. Mouth:  No deformity or lesions, oral mucosa pink.  Lungs:  Clear to auscultation bilaterally. No wheezes, rales, or rhonchi. No distress.  Heart:  S1, S2 present without murmurs appreciated.  Abdomen:  +BS, soft, non-tender and non-distended. No HSM noted. No guarding or rebound. No masses appreciated.  Rectal:  Deferred  Msk:  Symmetrical without gross deformities. Normal posture. Extremities:  Without clubbing or edema. Neurologic:  Alert and  oriented x4;  grossly normal neurologically. Skin:  Intact without significant lesions or rashes. Psych:  Alert and cooperative. Normal mood and affect.

## 2017-01-31 NOTE — Assessment & Plan Note (Signed)
68 year old very pleasant female with history of 2 colon polyps in 2006, path unknown. No concerning lower GI symptoms, and she has only been dealing with chronic constipation (see constipation). History also notable for perforated sigmoid diverticulitis in 2006 requiring partial colectomy; she has had no other issues or surgeries since that time.    Proceed with TCS with Dr. Gala Romney in near future: the risks, benefits, and alternatives have been discussed with the patient in detail. The patient states understanding and desires to proceed. Propofol due to history of Xanax use nightly

## 2017-01-31 NOTE — Telephone Encounter (Signed)
Spoke with pt and is aware pre-op scheduled for 02/25/17 at 10:00am. Letter mailed to pt. Nothing further is needed

## 2017-01-31 NOTE — Assessment & Plan Note (Signed)
Will start Linzess 72 mcg once daily, as her constipation appears mild. Samples provided. Call with progress report. May need to titrate.

## 2017-01-31 NOTE — Patient Instructions (Signed)
For constipation: you can stop Miralax. Start taking Linzess 1 capsule, 30 minutes before breakfast daily. The most common side effect is loose stool. You might have this a few days when first starting, but it should get better. If not, please call. Let us know how this dose works for you. We can always increase it! (there are 2 more doses higher than this).  We have scheduled you for a colonoscopy with Dr. Gala Romney in the near future!  It was a pleasure to see you today. I strive to create trusting relationships with patients to provide genuine, compassionate, and quality care. I value your feedback. If you receive a survey regarding your visit,  I greatly appreciate you the taking time to fill this out.   Annitta Needs, PhD, ANP-BC [facility]

## 2017-02-13 DIAGNOSIS — J309 Allergic rhinitis, unspecified: Secondary | ICD-10-CM | POA: Diagnosis not present

## 2017-02-21 NOTE — Patient Instructions (Signed)
Alexandra Hoffman  02/21/2017     @PREFPERIOPPHARMACY @   Your procedure is scheduled on  03/04/2017   Report to [facility] at  615   A.M.  Call this number if you have problems the morning of surgery:  [phone]   Remember:  Do not eat food or drink liquids after midnight.  Take these medicines the morning of surgery with A SIP OF WATER  Nexium, lisinopril, mobic.   Do not wear jewelry, make-up or nail polish.  Do not wear lotions, powders, or perfumes, or deodorant.  Do not shave 48 hours prior to surgery.  Men may shave face and neck.  Do not bring valuables to the hospital.  [facility] is not responsible for any belongings or valuables.  Contacts, dentures or bridgework may not be worn into surgery.  Leave your suitcase in the car.  After surgery it may be brought to your room.  For patients admitted to the hospital, discharge time will be determined by your treatment team.  Patients discharged the day of surgery will not be allowed to drive home.   Name and phone number of your driver:   family Special instructions:  Follow the diet and prep instructions given to you by Dr Roseanne Kaufman office.  Please read over the following fact sheets that you were given. Anesthesia Post-op Instructions and Care and Recovery After Surgery       Colonoscopy, Adult A colonoscopy is an exam to look at the large intestine. It is done to check for problems, such as:  Lumps (tumors).  Growths (polyps).  Swelling (inflammation).  Bleeding.  What happens before the procedure? Eating and drinking Follow instructions from your doctor about eating and drinking. These instructions may include:  A few days before the procedure - follow a low-fiber diet. ? Avoid nuts. ? Avoid seeds. ? Avoid dried fruit. ? Avoid raw fruits. ? Avoid vegetables.  1-3 days before the procedure - follow a clear liquid diet. Avoid liquids that have red or purple dye. Drink only clear liquids,  such as: ? Clear broth or bouillon. ? Black coffee or tea. ? Clear juice. ? Clear soft drinks or sports drinks. ? Gelatin dessert. ? Popsicles.  On the day of the procedure - do not eat or drink anything during the 2 hours before the procedure.  Bowel prep If you were prescribed an oral bowel prep:  Take it as told by your doctor. Starting the day before your procedure, you will need to drink a lot of liquid. The liquid will cause you to poop (have bowel movements) until your poop is almost clear or light green.  If your skin or butt gets irritated from diarrhea, you may: ? Wipe the area with wipes that have medicine in them, such as adult wet wipes with aloe and vitamin E. ? Put something on your skin that soothes the area, such as petroleum jelly.  If you throw up (vomit) while drinking the bowel prep, take a break for up to 60 minutes. Then begin the bowel prep again. If you keep throwing up and you cannot take the bowel prep without throwing up, call your doctor.  General instructions  Ask your doctor about changing or stopping your normal medicines. This is important if you take diabetes medicines or blood thinners.  Plan to have someone take you home from the hospital or clinic. What happens during the procedure?  An IV tube  may be put into one of your veins.  You will be given medicine to help you relax (sedative).  To reduce your risk of infection: ? Your doctors will wash their hands. ? Your anal area will be washed with soap.  You will be asked to lie on your side with your knees bent.  Your doctor will get a long, thin, flexible tube ready. The tube will have a camera and a light on the end.  The tube will be put into your anus.  The tube will be gently put into your large intestine.  Air will be delivered into your large intestine to keep it open. You may feel some pressure or cramping.  The camera will be used to take photos.  A small tissue sample may be  removed from your body to be looked at under a microscope (biopsy). If any possible problems are found, the tissue will be sent to a lab for testing.  If small growths are found, your doctor may remove them and have them checked for cancer.  The tube that was put into your anus will be slowly removed. The procedure may vary among doctors and hospitals. What happens after the procedure?  Your doctor will check on you often until the medicines you were given have worn off.  Do not drive for 24 hours after the procedure.  You may have a small amount of blood in your poop.  You may pass gas.  You may have mild cramps or bloating in your belly (abdomen).  It is up to you to get the results of your procedure. Ask your doctor, or the department performing the procedure, when your results will be ready. This information is not intended to replace advice given to you by your health care provider. Make sure you discuss any questions you have with your health care provider. Document Released: 01/20/2010 Document Revised: 10/19/2015 Document Reviewed: 03/01/2015 Elsevier Interactive Patient Education  2017 [employer]  Colonoscopy, Adult, Care After This sheet gives you information about how to care for yourself after your procedure. Your health care provider may also give you more specific instructions. If you have problems or questions, contact your health care provider. What can I expect after the procedure? After the procedure, it is common to have:  A small amount of blood in your stool for 24 hours after the procedure.  Some gas.  Mild abdominal cramping or bloating.  Follow these instructions at home: General instructions   For the first 24 hours after the procedure: ? Do not drive or use machinery. ? Do not sign important documents. ? Do not drink alcohol. ? Do your regular daily activities at a slower pace than normal. ? Eat soft, easy-to-digest foods. ? Rest  often.  Take over-the-counter or prescription medicines only as told by your health care provider.  It is up to you to get the results of your procedure. Ask your health care provider, or the department performing the procedure, when your results will be ready. Relieving cramping and bloating  Try walking around when you have cramps or feel bloated.  Apply heat to your abdomen as told by your health care provider. Use a heat source that your health care provider recommends, such as a moist heat pack or a heating pad. ? Place a towel between your skin and the heat source. ? Leave the heat on for 20-30 minutes. ? Remove the heat if your skin turns bright red. This is especially important if  you are unable to feel pain, heat, or cold. You may have a greater risk of getting burned. Eating and drinking  Drink enough fluid to keep your urine clear or pale yellow.  Resume your normal diet as instructed by your health care provider. Avoid heavy or fried foods that are hard to digest.  Avoid drinking alcohol for as long as instructed by your health care provider. Contact a health care provider if:  You have blood in your stool 2-3 days after the procedure. Get help right away if:  You have more than a small spotting of blood in your stool.  You pass large blood clots in your stool.  Your abdomen is swollen.  You have nausea or vomiting.  You have a fever.  You have increasing abdominal pain that is not relieved with medicine. This information is not intended to replace advice given to you by your health care provider. Make sure you discuss any questions you have with your health care provider. Document Released: 08/02/2003 Document Revised: 09/12/2015 Document Reviewed: 03/01/2015 Elsevier Interactive Patient Education  2018 [place] Anesthesia is a term that refers to techniques, procedures, and medicines that help a person stay safe and comfortable  during a medical procedure. Monitored anesthesia care, or sedation, is one type of anesthesia. Your anesthesia specialist may recommend sedation if you will be having a procedure that does not require you to be unconscious, such as:  Cataract surgery.  A dental procedure.  A biopsy.  A colonoscopy.  During the procedure, you may receive a medicine to help you relax (sedative). There are three levels of sedation:  Mild sedation. At this level, you may feel awake and relaxed. You will be able to follow directions.  Moderate sedation. At this level, you will be sleepy. You may not remember the procedure.  Deep sedation. At this level, you will be asleep. You will not remember the procedure.  The more medicine you are given, the deeper your level of sedation will be. Depending on how you respond to the procedure, the anesthesia specialist may change your level of sedation or the type of anesthesia to fit your needs. An anesthesia specialist will monitor you closely during the procedure. Let your health care provider know about:  Any allergies you have.  All medicines you are taking, including vitamins, herbs, eye drops, creams, and over-the-counter medicines.  Any use of steroids (by mouth or as a cream).  Any problems you or family members have had with sedatives and anesthetic medicines.  Any blood disorders you have.  Any surgeries you have had.  Any medical conditions you have, such as sleep apnea.  Whether you are pregnant or may be pregnant.  Any use of cigarettes, alcohol, or street drugs. What are the risks? Generally, this is a safe procedure. However, problems may occur, including:  Getting too much medicine (oversedation).  Nausea.  Allergic reaction to medicines.  Trouble breathing. If this happens, a breathing tube may be used to help with breathing. It will be removed when you are awake and breathing on your own.  Heart trouble.  Lung trouble.  Before  the procedure Staying hydrated Follow instructions from your health care provider about hydration, which may include:  Up to 2 hours before the procedure - you may continue to drink clear liquids, such as water, clear fruit juice, black coffee, and plain tea.  Eating and drinking restrictions Follow instructions from your health care provider about eating and  drinking, which may include:  8 hours before the procedure - stop eating heavy meals or foods such as meat, fried foods, or fatty foods.  6 hours before the procedure - stop eating light meals or foods, such as toast or cereal.  6 hours before the procedure - stop drinking milk or drinks that contain milk.  2 hours before the procedure - stop drinking clear liquids.  Medicines Ask your health care provider about:  Changing or stopping your regular medicines. This is especially important if you are taking diabetes medicines or blood thinners.  Taking medicines such as aspirin and ibuprofen. These medicines can thin your blood. Do not take these medicines before your procedure if your health care provider instructs you not to.  Tests and exams  You will have a physical exam.  You may have blood tests done to show: ? How well your kidneys and liver are working. ? How well your blood can clot.  General instructions  Plan to have someone take you home from the hospital or clinic.  If you will be going home right after the procedure, plan to have someone with you for 24 hours.  What happens during the procedure?  Your blood pressure, heart rate, breathing, level of pain and overall condition will be monitored.  An IV tube will be inserted into one of your veins.  Your anesthesia specialist will give you medicines as needed to keep you comfortable during the procedure. This may mean changing the level of sedation.  The procedure will be performed. After the procedure  Your blood pressure, heart rate, breathing rate, and  blood oxygen level will be monitored until the medicines you were given have worn off.  Do not drive for 24 hours if you received a sedative.  You may: ? Feel sleepy, clumsy, or nauseous. ? Feel forgetful about what happened after the procedure. ? Have a sore throat if you had a breathing tube during the procedure. ? Vomit. This information is not intended to replace advice given to you by your health care provider. Make sure you discuss any questions you have with your health care provider. Document Released: 09/13/2004 Document Revised: 05/27/2015 Document Reviewed: 04/10/2015 Elsevier Interactive Patient Education  2018 [place], Care After These instructions provide you with information about caring for yourself after your procedure. Your health care provider may also give you more specific instructions. Your treatment has been planned according to current medical practices, but problems sometimes occur. Call your health care provider if you have any problems or questions after your procedure. What can I expect after the procedure? After your procedure, it is common to:  Feel sleepy for several hours.  Feel clumsy and have poor balance for several hours.  Feel forgetful about what happened after the procedure.  Have poor judgment for several hours.  Feel nauseous or vomit.  Have a sore throat if you had a breathing tube during the procedure.  Follow these instructions at home: For at least 24 hours after the procedure:   Do not: ? Participate in activities in which you could fall or become injured. ? Drive. ? Use heavy machinery. ? Drink alcohol. ? Take sleeping pills or medicines that cause drowsiness. ? Make important decisions or sign legal documents. ? Take care of children on your own.  Rest. Eating and drinking  Follow the diet that is recommended by your health care provider.  If you vomit, drink water, juice, or soup when you  can drink without vomiting.  Make sure you have little or no nausea before eating solid foods. General instructions  Have a responsible adult stay with you until you are awake and alert.  Take over-the-counter and prescription medicines only as told by your health care provider.  If you smoke, do not smoke without supervision.  Keep all follow-up visits as told by your health care provider. This is important. Contact a health care provider if:  You keep feeling nauseous or you keep vomiting.  You feel light-headed.  You develop a rash.  You have a fever. Get help right away if:  You have trouble breathing. This information is not intended to replace advice given to you by your health care provider. Make sure you discuss any questions you have with your health care provider. Document Released: 04/10/2015 Document Revised: 08/10/2015 Document Reviewed: 04/10/2015 Elsevier Interactive Patient Education  [employer].

## 2017-02-25 ENCOUNTER — Encounter (HOSPITAL_COMMUNITY): Payer: Self-pay

## 2017-02-25 ENCOUNTER — Encounter (HOSPITAL_COMMUNITY)
Admission: RE | Admit: 2017-02-25 | Discharge: 2017-02-25 | Disposition: A | Discharge status: Home / Self Care | Payer: Medicare Other | Source: Ambulatory Visit | Attending: Internal Medicine | Admitting: Internal Medicine

## 2017-02-25 ENCOUNTER — Other Ambulatory Visit: Payer: Self-pay

## 2017-02-25 DIAGNOSIS — Z01818 Encounter for other preprocedural examination: Secondary | ICD-10-CM | POA: Diagnosis not present

## 2017-02-25 LAB — CBC WITH DIFFERENTIAL/PLATELET
BASOS ABS: 0 10*3/uL (ref 0.0–0.1)
BASOS PCT: 1 %
EOS PCT: 4 %
Eosinophils Absolute: 0.1 10*3/uL (ref 0.0–0.7)
HCT: 41.1 % (ref 36.0–46.0)
Hemoglobin: 13.5 g/dL (ref 12.0–15.0)
Lymphocytes Relative: 53 %
Lymphs Abs: 2.1 10*3/uL (ref 0.7–4.0)
MCH: 28.5 pg (ref 26.0–34.0)
MCHC: 32.8 g/dL (ref 30.0–36.0)
MCV: 86.9 fL (ref 78.0–100.0)
MONO ABS: 0 10*3/uL — AB (ref 0.1–1.0)
Monocytes Relative: 1 %
Neutro Abs: 1.5 10*3/uL — ABNORMAL LOW (ref 1.7–7.7)
Neutrophils Relative %: 41 %
Platelets: 176 10*3/uL (ref 150–400)
RBC: 4.73 MIL/uL (ref 3.87–5.11)
RDW: 13.7 % (ref 11.5–15.5)
WBC: 3.8 10*3/uL — ABNORMAL LOW (ref 4.0–10.5)

## 2017-02-25 LAB — BASIC METABOLIC PANEL
ANION GAP: 8 (ref 5–15)
BUN: 26 mg/dL — ABNORMAL HIGH (ref 6–20)
CALCIUM: 8.9 mg/dL (ref 8.9–10.3)
CO2: 25 mmol/L (ref 22–32)
Chloride: 105 mmol/L (ref 101–111)
Creatinine, Ser: 0.86 mg/dL (ref 0.44–1.00)
GFR calc non Af Amer: >60 mL/min (ref >60)
GLUCOSE: 92 mg/dL (ref 65–99)
Potassium: 3.8 mmol/L (ref 3.5–5.1)
Sodium: 138 mmol/L (ref 135–145)

## 2017-03-04 ENCOUNTER — Ambulatory Visit (HOSPITAL_COMMUNITY)
Admission: RE | Admit: 2017-03-04 | Discharge: 2017-03-04 | Disposition: A | Discharge status: Home / Self Care | Payer: Medicare Other | Source: Ambulatory Visit | Attending: Internal Medicine | Admitting: Internal Medicine

## 2017-03-04 ENCOUNTER — Encounter (HOSPITAL_COMMUNITY): Payer: Self-pay

## 2017-03-04 ENCOUNTER — Encounter (HOSPITAL_COMMUNITY)
Admission: RE | Disposition: A | Discharge status: Home / Self Care | Payer: Self-pay | Source: Ambulatory Visit | Attending: Internal Medicine

## 2017-03-04 ENCOUNTER — Other Ambulatory Visit: Payer: Self-pay

## 2017-03-04 ENCOUNTER — Ambulatory Visit (HOSPITAL_COMMUNITY): Payer: Medicare Other | Admitting: Anesthesiology

## 2017-03-04 DIAGNOSIS — Z811 Family history of alcohol abuse and dependence: Secondary | ICD-10-CM | POA: Insufficient documentation

## 2017-03-04 DIAGNOSIS — Z8249 Family history of ischemic heart disease and other diseases of the circulatory system: Secondary | ICD-10-CM | POA: Diagnosis not present

## 2017-03-04 DIAGNOSIS — Z833 Family history of diabetes mellitus: Secondary | ICD-10-CM | POA: Diagnosis not present

## 2017-03-04 DIAGNOSIS — D120 Benign neoplasm of cecum: Secondary | ICD-10-CM | POA: Insufficient documentation

## 2017-03-04 DIAGNOSIS — Z8261 Family history of arthritis: Secondary | ICD-10-CM | POA: Diagnosis not present

## 2017-03-04 DIAGNOSIS — Z9049 Acquired absence of other specified parts of digestive tract: Secondary | ICD-10-CM | POA: Diagnosis not present

## 2017-03-04 DIAGNOSIS — K5730 Diverticulosis of large intestine without perforation or abscess without bleeding: Secondary | ICD-10-CM | POA: Diagnosis not present

## 2017-03-04 DIAGNOSIS — Z79899 Other long term (current) drug therapy: Secondary | ICD-10-CM | POA: Insufficient documentation

## 2017-03-04 DIAGNOSIS — Z1211 Encounter for screening for malignant neoplasm of colon: Secondary | ICD-10-CM | POA: Diagnosis not present

## 2017-03-04 DIAGNOSIS — M1990 Unspecified osteoarthritis, unspecified site: Secondary | ICD-10-CM | POA: Insufficient documentation

## 2017-03-04 DIAGNOSIS — K219 Gastro-esophageal reflux disease without esophagitis: Secondary | ICD-10-CM | POA: Diagnosis not present

## 2017-03-04 DIAGNOSIS — F329 Major depressive disorder, single episode, unspecified: Secondary | ICD-10-CM | POA: Insufficient documentation

## 2017-03-04 DIAGNOSIS — Z860100 Personal history of colon polyps, unspecified: Secondary | ICD-10-CM

## 2017-03-04 DIAGNOSIS — Z980 Intestinal bypass and anastomosis status: Secondary | ICD-10-CM | POA: Diagnosis not present

## 2017-03-04 DIAGNOSIS — K5900 Constipation, unspecified: Secondary | ICD-10-CM | POA: Insufficient documentation

## 2017-03-04 DIAGNOSIS — D124 Benign neoplasm of descending colon: Secondary | ICD-10-CM | POA: Diagnosis not present

## 2017-03-04 DIAGNOSIS — K635 Polyp of colon: Secondary | ICD-10-CM | POA: Insufficient documentation

## 2017-03-04 DIAGNOSIS — Z823 Family history of stroke: Secondary | ICD-10-CM | POA: Diagnosis not present

## 2017-03-04 DIAGNOSIS — I10 Essential (primary) hypertension: Secondary | ICD-10-CM | POA: Diagnosis not present

## 2017-03-04 DIAGNOSIS — F419 Anxiety disorder, unspecified: Secondary | ICD-10-CM | POA: Diagnosis not present

## 2017-03-04 HISTORY — PX: POLYPECTOMY: SHX5525

## 2017-03-04 HISTORY — PX: COLONOSCOPY WITH PROPOFOL: SHX5780

## 2017-03-04 SURGERY — COLONOSCOPY WITH PROPOFOL
Anesthesia: Monitor Anesthesia Care

## 2017-03-04 MED ORDER — FENTANYL CITRATE (PF) 100 MCG/2ML IJ SOLN
INTRAMUSCULAR | Status: AC
  Filled 2017-03-04: qty 2

## 2017-03-04 MED ORDER — STERILE WATER FOR IRRIGATION IR SOLN
Status: DC | PRN
  Administered 2017-03-04: 100 mL

## 2017-03-04 MED ORDER — SUCCINYLCHOLINE CHLORIDE 20 MG/ML IJ SOLN
INTRAMUSCULAR | Status: AC
Start: 2017-03-04 — End: 2017-03-04
  Filled 2017-03-04: qty 1

## 2017-03-04 MED ORDER — CHLORHEXIDINE GLUCONATE CLOTH 2 % EX PADS: 6.0000 | MEDICATED_PAD | Freq: Once | CUTANEOUS | Status: DC

## 2017-03-04 MED ORDER — MIDAZOLAM HCL 2 MG/2ML IJ SOLN
INTRAMUSCULAR | Status: AC
  Filled 2017-03-04: qty 2

## 2017-03-04 MED ORDER — LACTATED RINGERS IV SOLN
INTRAVENOUS | Status: DC
  Administered 2017-03-04: 07:00:00 via INTRAVENOUS

## 2017-03-04 MED ORDER — FENTANYL CITRATE (PF) 100 MCG/2ML IJ SOLN
25.0000 ug | Freq: Once | INTRAMUSCULAR | Status: AC
  Administered 2017-03-04: 25 ug via INTRAVENOUS

## 2017-03-04 MED ORDER — LIDOCAINE HCL (CARDIAC) 10 MG/ML IV SOLN
INTRAVENOUS | Status: DC | PRN
  Administered 2017-03-04: 40 mg via INTRAVENOUS

## 2017-03-04 MED ORDER — EPHEDRINE SULFATE 50 MG/ML IJ SOLN
INTRAMUSCULAR | Status: AC
  Filled 2017-03-04: qty 1

## 2017-03-04 MED ORDER — PROPOFOL 10 MG/ML IV BOLUS
INTRAVENOUS | Status: AC
  Filled 2017-03-04: qty 80

## 2017-03-04 MED ORDER — SODIUM CHLORIDE 0.9 % IJ SOLN
INTRAMUSCULAR | Status: AC
  Filled 2017-03-04: qty 10

## 2017-03-04 MED ORDER — MIDAZOLAM HCL 2 MG/2ML IJ SOLN
1.0000 mg | INTRAMUSCULAR | Status: AC
  Administered 2017-03-04: 2 mg via INTRAVENOUS

## 2017-03-04 MED ORDER — PROPOFOL 500 MG/50ML IV EMUL
INTRAVENOUS | Status: DC | PRN
  Administered 2017-03-04: 150 ug/kg/min via INTRAVENOUS

## 2017-03-04 NOTE — Anesthesia Postprocedure Evaluation (Signed)
Anesthesia Post Note  Patient: Alexandra Hoffman  Procedure(s) Performed: COLONOSCOPY WITH PROPOFOL (N/A ) POLYPECTOMY  Patient location during evaluation: PACU Anesthesia Type: MAC Level of consciousness: awake and alert, oriented and patient cooperative Pain management: pain level controlled Vital Signs Assessment: post-procedure vital signs reviewed and stable Respiratory status: spontaneous breathing and respiratory function stable Cardiovascular status: stable Postop Assessment: no apparent nausea or vomiting Anesthetic complications: no     Last Vitals:  Vitals:   03/04/17 0634  BP: 135/80  Pulse: 82  Resp: 18  Temp: 36.9 C  SpO2: 98%    Last Pain:  Vitals:   03/04/17 0634  TempSrc: Oral                 ADAMS, AMY A

## 2017-03-04 NOTE — Transfer of Care (Signed)
Immediate Anesthesia Transfer of Care Note  Patient: Alexandra Hoffman  Procedure(s) Performed: COLONOSCOPY WITH PROPOFOL (N/A ) POLYPECTOMY  Patient Location: PACU  Anesthesia Type:MAC  Level of Consciousness: awake, alert , oriented and patient cooperative  Airway & Oxygen Therapy: Patient Spontanous Breathing  Post-op Assessment: Report given to RN and Post -op Vital signs reviewed and stable  Post vital signs: Reviewed and stable  Last Vitals:  Vitals:   03/04/17 0634  BP: 135/80  Pulse: 82  Resp: 18  Temp: 36.9 C  SpO2: 98%    Last Pain:  Vitals:   03/04/17 0634  TempSrc: Oral      Patients Stated Pain Goal: 5 (81/85/63 1497)  Complications: No apparent anesthesia complications

## 2017-03-04 NOTE — Op Note (Signed)
[facility] Patient Name: Alexandra Hoffman Procedure Date: 03/04/2017 7:13 AM MRN: [id number] Date of Birth: [birth year] Attending MD: Norvel Richards , MD CSN: [id number] Age: 67 Admit Type: Outpatient Procedure:                Colonoscopy Indications:              High risk colon cancer surveillance: Personal                            history of colonic polyps Providers:                Norvel Richards, MD, Rosina Lowenstein, RN, Randa Spike, Technician Referring MD:              Medicines:                Propofol per Anesthesia Complications:            No immediate complications. Estimated Blood Loss:     Estimated blood loss was minimal. Procedure:                Pre-Anesthesia Assessment:                           - Prior to the procedure, a History and Physical                            was performed, and patient medications and                            allergies were reviewed. The patient's tolerance of                            previous anesthesia was also reviewed. The risks                            and benefits of the procedure and the sedation                            options and risks were discussed with the patient.                            All questions were answered, and informed consent                            was obtained. Prior Anticoagulants: The patient has                            taken no previous anticoagulant or antiplatelet                            agents. ASA Grade Assessment: II - A patient with  mild systemic disease. After reviewing the risks                            and benefits, the patient was deemed in                            satisfactory condition to undergo the procedure.                           After obtaining informed consent, the colonoscope                            was passed under direct vision. Throughout the                            procedure, the  patient's blood pressure, pulse, and                            oxygen saturations were monitored continuously. The                            EC-[id number] ([id number]) scope was introduced through                            the and advanced to the the cecum, identified by                            appendiceal orifice and ileocecal valve. The                            colonoscopy was performed without difficulty. The                            patient tolerated the procedure well. The quality                            of the bowel preparation was adequate. The quality                            of the bowel preparation was adequate. The                            ileocecal valve, appendiceal orifice, and rectum                            were photographed. Scope In: 7:32:54 AM Scope Out: 7:47:35 AM Scope Withdrawal Time: 0 hours 9 minutes 37 seconds  Total Procedure Duration: 0 hours 14 minutes 41 seconds  Findings:      The perianal and digital rectal examinations were normal.      A 10 mm polyp was found in the cecum. The polyp was semi-pedunculated.       The polyp was removed with a hot snare. Resection and retrieval were       complete. Estimated blood loss: none.  A 5 mm polyp was found in the descending colon. The polyp was sessile.       The polyp was removed with a cold snare. Resection and retrieval were       complete. Estimated blood loss was minimal.      Scattered small and large-mouthed diverticula were found in the       descending colon. Surgical anastomosis identified at 20 cm from anal       verge.      The exam was otherwise without abnormality on direct and retroflexion       views. Impression:               - One 10 mm polyp in the cecum, removed with a hot                            snare. Resected and retrieved.                           - One 5 mm polyp in the descending colon, removed                            with a cold snare. Resected and retrieved.                            - Diverticulosis in the descending colon. status                            post prior segmental resection.                           - The examination was otherwise normal on direct                            and retroflexion views. Moderate Sedation:      Moderate (conscious) sedation was personally administered by an       anesthesia professional. The following parameters were monitored: oxygen       saturation, heart rate, blood pressure, respiratory rate, EKG, adequacy       of pulmonary ventilation, and response to care. Total physician       intraservice time was 19 minutes. Recommendation:           - Patient has a contact number available for                            emergencies. The signs and symptoms of potential                            delayed complications were discussed with the                            patient. Return to normal activities tomorrow.                            Written discharge instructions were provided to the  patient.                           - Resume previous diet.                           - Continue present medications.                           - Repeat colonoscopy date to be determined after                            pending pathology results are reviewed for                            surveillance based on pathology results.                           - Return to GI clinic after studies are complete. Procedure Code(s):        --- Professional ---                           [phone], Colonoscopy, flexible; with removal of                            tumor(s), polyp(s), or other lesion(s) by snare                            technique Diagnosis Code(s):        --- Professional ---                           Z86.010, Personal history of colonic polyps                           D12.0, Benign neoplasm of cecum                           D12.4, Benign neoplasm of descending colon                            K57.30, Diverticulosis of large intestine without                            perforation or abscess without bleeding CPT copyright 2016 American [employer] Association. All rights reserved. The codes documented in this report are preliminary and upon coder review may  be revised to meet current compliance requirements. [name]. Wai Minotti, MD Norvel Richards, MD 03/04/2017 7:59:54 AM This report has been signed electronically. Number of Addenda: 0

## 2017-03-04 NOTE — Anesthesia Preprocedure Evaluation (Signed)
Anesthesia Evaluation  Patient identified by MRN, date of birth, ID band Patient awake    Reviewed: Allergy & Precautions, NPO status , Patient's Chart, lab work & pertinent test results  Airway Mallampati: II  TM Distance: >3 FB     Dental  (+) Teeth Intact   Pulmonary neg pulmonary ROS,    breath sounds clear to auscultation       Cardiovascular hypertension, Pt. on medications  Rhythm:Regular Rate:Normal     Neuro/Psych PSYCHIATRIC DISORDERS Anxiety Depression negative neurological ROS     GI/Hepatic GERD  ,  Endo/Other    Renal/GU      Musculoskeletal  (+) Arthritis ,   Abdominal   Peds  Hematology   Anesthesia Other Findings   Reproductive/Obstetrics                             Anesthesia Physical Anesthesia Plan  ASA: III  Anesthesia Plan: MAC   Post-op Pain Management:    Induction: Intravenous  PONV Risk Score and Plan:   Airway Management Planned: Simple Face Mask  Additional Equipment:   Intra-op Plan:   Post-operative Plan:   Informed Consent: I have reviewed the patients History and Physical, chart, labs and discussed the procedure including the risks, benefits and alternatives for the proposed anesthesia with the patient or authorized representative who has indicated his/her understanding and acceptance.     Plan Discussed with:   Anesthesia Plan Comments:         Anesthesia Quick Evaluation

## 2017-03-04 NOTE — Anesthesia Procedure Notes (Signed)
Procedure Name: MAC Date/Time: 03/04/2017 7:26 AM Performed by: Andree Elk Amy A, CRNA Pre-anesthesia Checklist: Patient identified, Emergency Drugs available, Suction available, Patient being monitored and Timeout performed Oxygen Delivery Method: Simple face mask

## 2017-03-04 NOTE — Discharge Instructions (Signed)
Diverticulosis Diverticulosis is a condition that develops when small pouches (diverticula) form in the wall of the large intestine (colon). The colon is where water is absorbed and stool is formed. The pouches form when the inside layer of the colon pushes through weak spots in the outer layers of the colon. You may have a few pouches or many of them. What are the causes? The cause of this condition is not known. What increases the risk? The following factors may make you more likely to develop this condition:  Being older than age 27. Your risk for this condition increases with age. Diverticulosis is rare among people younger than age 36. By age 28, many people have it.  Eating a low-fiber diet.  Having frequent constipation.  Being overweight.  Not getting enough exercise.  Smoking.  Taking over-the-counter pain medicines, like aspirin and ibuprofen.  Having a family history of diverticulosis.  What are the signs or symptoms? In most people, there are no symptoms of this condition. If you do have symptoms, they may include:  Bloating.  Cramps in the abdomen.  Constipation or diarrhea.  Pain in the lower left side of the abdomen.  How is this diagnosed? This condition is most often diagnosed during an exam for other colon problems. Because diverticulosis usually has no symptoms, it often cannot be diagnosed independently. This condition may be diagnosed by:  Using a flexible scope to examine the colon (colonoscopy).  Taking an X-ray of the colon after dye has been put into the colon (barium enema).  Doing a CT scan.  How is this treated? You may not need treatment for this condition if you have never developed an infection related to diverticulosis. If you have had an infection before, treatment may include:  Eating a high-fiber diet. This may include eating more fruits, vegetables, and grains.  Taking a fiber supplement.  Taking a live bacteria supplement  (probiotic).  Taking medicine to relax your colon.  Taking antibiotic medicines.  Follow these instructions at home:  Drink 6-8 glasses of water or more each day to prevent constipation.  Try not to strain when you have a bowel movement.  If you have had an infection before: ? Eat more fiber as directed by your health care provider or your diet and nutrition specialist (dietitian). ? Take a fiber supplement or probiotic, if your health care provider approves.  Take over-the-counter and prescription medicines only as told by your health care provider.  If you were prescribed an antibiotic, take it as told by your health care provider. Do not stop taking the antibiotic even if you start to feel better.  Keep all follow-up visits as told by your health care provider. This is important. Contact a health care provider if:  You have pain in your abdomen.  You have bloating.  You have cramps.  You have not had a bowel movement in 3 days. Get help right away if:  Your pain gets worse.  Your bloating becomes very bad.  You have a fever or chills, and your symptoms suddenly get worse.  You vomit.  You have bowel movements that are bloody or black.  You have bleeding from your rectum. Summary  Diverticulosis is a condition that develops when small pouches (diverticula) form in the wall of the large intestine (colon).  You may have a few pouches or many of them.  This condition is most often diagnosed during an exam for other colon problems.  If you have had an  infection related to diverticulosis, treatment may include increasing the fiber in your diet, taking supplements, or taking medicines. °This information is not intended to replace advice given to you by your health care provider. Make sure you discuss any questions you have with your health care provider. °Document Released: 09/15/2003 Document Revised: 11/07/2015 Document Reviewed: 11/07/2015 °Elsevier Interactive  Patient Education © 2017 [employer] °Colon Polyps °Polyps are tissue growths inside the body. Polyps can grow in many places, including the large intestine (colon). A polyp may be a round bump or a mushroom-shaped growth. You could have one polyp or several. °Most colon polyps are noncancerous (benign). However, some colon polyps can become cancerous over time. °What are the causes? °The exact cause of colon polyps is not known. °What increases the risk? °This condition is more likely to develop in people who: °· Have a family history of colon cancer or colon polyps. °· Are older than 50 or older than 45 if they are African American. °· Have inflammatory bowel disease, such as ulcerative colitis or Crohn disease. °· Are overweight. °· Smoke cigarettes. °· Do not get enough exercise. °· Drink too much alcohol. °· Eat a diet that is: °? High in fat and red meat. °? Low in fiber. °· Had childhood cancer that was treated with abdominal radiation. ° °What are the signs or symptoms? °Most polyps do not cause symptoms. If you have symptoms, they may include: °· Blood coming from your rectum when having a bowel movement. °· Blood in your stool. The stool may look dark red or black. °· A change in bowel habits, such as constipation or diarrhea. ° °How is this diagnosed? °This condition is diagnosed with a colonoscopy. This is a procedure that uses a lighted, flexible scope to look at the inside of your colon. °How is this treated? °Treatment for this condition involves removing any polyps that are found. Those polyps will then be tested for cancer. If cancer is found, your health care provider will talk to you about options for colon cancer treatment. °Follow these instructions at home: °Diet °· Eat plenty of fiber, such as fruits, vegetables, and whole grains. °· Eat foods that are high in calcium and vitamin D, such as milk, cheese, yogurt, eggs, liver, fish, and broccoli. °· Limit foods high in fat, red meats, and  processed meats, such as hot dogs, sausage, bacon, and lunch meats. °· Maintain a healthy weight, or lose weight if recommended by your health care provider. °General instructions °· Do not smoke cigarettes. °· Do not drink alcohol excessively. °· Keep all follow-up visits as told by your health care provider. This is important. This includes keeping regularly scheduled colonoscopies. Talk to your health care provider about when you need a colonoscopy. °· Exercise every day or as told by your health care provider. °Contact a health care provider if: °· You have new or worsening bleeding during a bowel movement. °· You have new or increased blood in your stool. °· You have a change in bowel habits. °· You unexpectedly lose weight. °This information is not intended to replace advice given to you by your health care provider. Make sure you discuss any questions you have with your health care provider. °Document Released: 09/14/2003 Document Revised: 05/26/2015 Document Reviewed: 11/08/2014 °Elsevier Interactive Patient Education © 2018 [employer] ° °Colonoscopy °Discharge Instructions ° °Read the instructions outlined below and refer to this sheet in the next few weeks. These discharge instructions provide you with general information on caring   for yourself after you leave the hospital. Your doctor may also give you specific instructions. While your treatment has been planned according to the most current medical practices available, unavoidable complications occasionally occur. If you have any problems or questions after discharge, call Dr. Rourk at [phone]. °ACTIVITY °· You may resume your regular activity, but move at a slower pace for the next 24 hours.  °· Take frequent rest periods for the next 24 hours.  °· Walking will help get rid of the air and reduce the bloated feeling in your belly (abdomen).  °· No driving for 24 hours (because of the medicine (anesthesia) used during the test).   °· Do not sign any  important legal documents or operate any machinery for 24 hours (because of the anesthesia used during the test).  °NUTRITION °· Drink plenty of fluids.  °· You may resume your normal diet as instructed by your doctor.  °· Begin with a light meal and progress to your normal diet. Heavy or fried foods are harder to digest and may make you feel sick to your stomach (nauseated).  °· Avoid alcoholic beverages for 24 hours or as instructed.  °MEDICATIONS °· You may resume your normal medications unless your doctor tells you otherwise.  °WHAT YOU CAN EXPECT TODAY °· Some feelings of bloating in the abdomen.  °· Passage of more gas than usual.  °· Spotting of blood in your stool or on the toilet paper.  °IF YOU HAD POLYPS REMOVED DURING THE COLONOSCOPY: °· No aspirin products for 7 days or as instructed.  °· No alcohol for 7 days or as instructed.  °· Eat a soft diet for the next 24 hours.  °FINDING OUT THE RESULTS OF YOUR TEST °Not all test results are available during your visit. If your test results are not back during the visit, make an appointment with your caregiver to find out the results. Do not assume everything is normal if you have not heard from your caregiver or the medical facility. It is important for you to follow up on all of your test results.  °SEEK IMMEDIATE MEDICAL ATTENTION IF: °· You have more than a spotting of blood in your stool.  °· Your belly is swollen (abdominal distention).  °· You are nauseated or vomiting.  °· You have a temperature over 101.  °· You have abdominal pain or discomfort that is severe or gets worse throughout the day.  ° °Colon polyp and diverticulosis information provided ° °Further recommendations to follow pending review of pathology report ° °

## 2017-03-04 NOTE — H&P (Signed)
@LOGO @   Primary Care Physician:  Celene Squibb, MD Primary Gastroenterologist:  Dr. Gala Romney  Pre-Procedure History & Physical: HPI:  Alexandra Hoffman is a 67 y.o. female here for surveillance colonoscopy. Distant history of colon polyps. Status post sigmoid colectomy for perforated diverticulitis previously. Chronically constipated.  inzess did not help constipation. Likes MiraLAX better.  Past Medical History:  Diagnosis Date  . Anxiety   . Arthritis    knees  . Constipation   . Depression   . GERD (gastroesophageal reflux disease)   . Hypertension     Past Surgical History:  Procedure Laterality Date  . COLON SURGERY  2006   perforated sigmoid diverticulitis, Dr Arnoldo Morale  . COLONOSCOPY  2006   Dr. Gala Romney: normal rectum and a few sigmoid diverticula, 7 mm angry pedunculated polyp at 30 cm, 4 mm sessile polyp base of cecum. Unknown path     Prior to Admission medications   Medication Sig Start Date End Date Taking? Authorizing Provider  ALPRAZolam Duanne Moron) 0.5 MG tablet Take 0.5 mg by mouth at bedtime.   Yes [provider]  cyanocobalamin 500 MCG tablet Take 500 mcg by mouth daily.   Yes [provider]  esomeprazole (NEXIUM) 20 MG capsule Take 1 capsule (20 mg total) by mouth daily at 12 noon. Patient taking differently: Take 20 mg by mouth daily as needed (acid reflux).  02/06/16  Yes Raylene Everts, MD  guaiFENesin (MUCINEX) 600 MG 12 hr tablet Take 600 mg by mouth daily.   Yes [provider]  lisinopril (PRINIVIL,ZESTRIL) 10 MG tablet Take 10 mg by mouth daily.    Yes [provider]  meloxicam (MOBIC) 15 MG tablet Take 15 mg by mouth daily.   Yes [provider]  Omega-3 Fatty Acids (FISH OIL) 1000 MG CAPS Take 1,000 mg by mouth daily.    Yes [provider]  oseltamivir (TAMIFLU) 75 MG capsule Take 75 mg by mouth daily.   Yes [provider]  polyethylene glycol (MIRALAX / GLYCOLAX) packet Take 17 g by mouth  daily as needed for moderate constipation.   Yes [provider]    Allergies as of 01/31/2017  . (No Known Allergies)    Family History  Problem Relation Age of Onset  . Arthritis Mother   . Depression Mother   . Diabetes Mother   . Heart disease Mother   . Hypertension Mother   . Stroke Mother   . Kidney disease Mother 31       dialysis  . Alcohol abuse Father   . Arthritis Father   . Cancer Father 26       brain  . Hypertension Father   . Hypertension Sister   . Arthritis Sister   . Hypertension Brother   . Arthritis Brother   . Hypertension Brother   . Arthritis Brother   . Hypertension Brother   . Hypertension Brother   . Hypertension Brother   . Hypertension Brother   . Other Brother        MVA  . Colon cancer Neg Hx     Social History   Socioeconomic History  . Marital status: Widowed    Spouse name: Not on file  . Number of children: 2  . Years of education: 10  . Highest education level: Not on file  Social Needs  . Financial resource strain: Not on file  . Food insecurity - worry: Not on file  . Food insecurity - inability:  Not on file  . Transportation needs - medical: Not on file  . Transportation needs - non-medical: Not on file  Occupational History  . 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 Smoking status: Never Smoker  . Smokeless tobacco: Never Used  Substance and Sexual Activity  . Alcohol use: No  . Drug use: No  . Sexual activity: Not Currently    Birth control/protection: Post-menopausal  Other Topics Concern  . Not on file  Social History Narrative   Lives alone   Widow   2 grown daughters, 5 grandchildren.   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 No significant cervical adenopathy. Lungs:  Clear throughout to auscultation.   No wheezes, crackles, or rhonchi. No acute distress. Heart:  Regular rate and rhythm; no murmurs, clicks, rubs,  or gallops. Abdomen: Non-distended, normal bowel sounds.  Soft and nontender without appreciable mass or hepatosplenomegaly.  Pulses:  Normal pulses noted. Extremities:  Without clubbing or edema.  Impression:  Pleasant 67 year old lady with a distant history of colonic polyps.; Here for surveillance examination.  Recommendations: I have offered the patient a surveillance colonoscopy per plan.  The risks, benefits, limitations, alternatives and imponderables have been reviewed with the patient. Questions have been answered. All parties are agreeable.      Notice: This dictation was prepared with Dragon dictation along with smaller phrase technology. Any transcriptional errors that result from this process are unintentional and may not be corrected upon review.

## 2017-03-05 DIAGNOSIS — Z23 Encounter for immunization: Secondary | ICD-10-CM | POA: Diagnosis not present

## 2017-03-07 ENCOUNTER — Encounter: Payer: Self-pay | Admitting: Internal Medicine

## 2017-03-11 DIAGNOSIS — I10 Essential (primary) hypertension: Secondary | ICD-10-CM | POA: Diagnosis not present

## 2017-03-15 ENCOUNTER — Encounter (HOSPITAL_COMMUNITY): Payer: Self-pay | Admitting: Internal Medicine

## 2017-06-19 DIAGNOSIS — N390 Urinary tract infection, site not specified: Secondary | ICD-10-CM | POA: Diagnosis not present

## 2017-06-19 DIAGNOSIS — R350 Frequency of micturition: Secondary | ICD-10-CM | POA: Diagnosis not present

## 2017-07-15 DIAGNOSIS — Z23 Encounter for immunization: Secondary | ICD-10-CM | POA: Diagnosis not present

## 2017-07-15 DIAGNOSIS — J019 Acute sinusitis, unspecified: Secondary | ICD-10-CM | POA: Diagnosis not present

## 2017-07-30 DIAGNOSIS — M25561 Pain in right knee: Secondary | ICD-10-CM | POA: Diagnosis not present

## 2017-08-19 DIAGNOSIS — H579 Unspecified disorder of eye and adnexa: Secondary | ICD-10-CM | POA: Diagnosis not present

## 2017-10-21 DIAGNOSIS — L821 Other seborrheic keratosis: Secondary | ICD-10-CM | POA: Diagnosis not present

## 2017-11-04 DIAGNOSIS — J019 Acute sinusitis, unspecified: Secondary | ICD-10-CM | POA: Diagnosis not present

## 2017-11-07 DIAGNOSIS — I10 Essential (primary) hypertension: Secondary | ICD-10-CM | POA: Diagnosis not present

## 2017-11-11 DIAGNOSIS — I10 Essential (primary) hypertension: Secondary | ICD-10-CM | POA: Diagnosis not present

## 2017-11-11 DIAGNOSIS — Z0001 Encounter for general adult medical examination with abnormal findings: Secondary | ICD-10-CM | POA: Diagnosis not present

## 2017-11-11 DIAGNOSIS — J309 Allergic rhinitis, unspecified: Secondary | ICD-10-CM | POA: Diagnosis not present

## 2017-11-11 DIAGNOSIS — E785 Hyperlipidemia, unspecified: Secondary | ICD-10-CM | POA: Diagnosis not present

## 2017-11-11 DIAGNOSIS — Z23 Encounter for immunization: Secondary | ICD-10-CM | POA: Diagnosis not present

## 2018-01-02 DIAGNOSIS — J0100 Acute maxillary sinusitis, unspecified: Secondary | ICD-10-CM | POA: Diagnosis not present

## 2018-01-13 DIAGNOSIS — E785 Hyperlipidemia, unspecified: Secondary | ICD-10-CM | POA: Diagnosis not present

## 2018-01-13 DIAGNOSIS — L309 Dermatitis, unspecified: Secondary | ICD-10-CM | POA: Diagnosis not present

## 2018-02-03 DIAGNOSIS — J019 Acute sinusitis, unspecified: Secondary | ICD-10-CM | POA: Diagnosis not present

## 2018-02-04 DIAGNOSIS — M170 Bilateral primary osteoarthritis of knee: Secondary | ICD-10-CM | POA: Diagnosis not present

## 2018-02-26 DIAGNOSIS — E785 Hyperlipidemia, unspecified: Secondary | ICD-10-CM | POA: Diagnosis not present

## 2018-03-03 DIAGNOSIS — I10 Essential (primary) hypertension: Secondary | ICD-10-CM | POA: Diagnosis not present

## 2018-03-03 DIAGNOSIS — E782 Mixed hyperlipidemia: Secondary | ICD-10-CM | POA: Diagnosis not present

## 2018-03-03 DIAGNOSIS — Z23 Encounter for immunization: Secondary | ICD-10-CM | POA: Diagnosis not present

## 2018-03-06 ENCOUNTER — Ambulatory Visit (INDEPENDENT_AMBULATORY_CARE_PROVIDER_SITE_OTHER): Payer: Medicare Other | Admitting: Otolaryngology

## 2018-03-06 ENCOUNTER — Other Ambulatory Visit (HOSPITAL_COMMUNITY): Payer: Self-pay | Admitting: Internal Medicine

## 2018-03-06 DIAGNOSIS — Z780 Asymptomatic menopausal state: Secondary | ICD-10-CM

## 2018-03-06 DIAGNOSIS — J342 Deviated nasal septum: Secondary | ICD-10-CM | POA: Diagnosis not present

## 2018-03-06 DIAGNOSIS — J310 Chronic rhinitis: Secondary | ICD-10-CM

## 2018-03-06 DIAGNOSIS — J343 Hypertrophy of nasal turbinates: Secondary | ICD-10-CM

## 2018-03-10 ENCOUNTER — Inpatient Hospital Stay (HOSPITAL_COMMUNITY): Admission: RE | Admit: 2018-03-10 | Payer: Medicare Other | Source: Ambulatory Visit

## 2018-03-11 ENCOUNTER — Ambulatory Visit (HOSPITAL_COMMUNITY)
Admission: RE | Admit: 2018-03-11 | Discharge: 2018-03-11 | Disposition: A | Discharge status: Home / Self Care | Payer: Medicare Other | Source: Ambulatory Visit | Attending: Internal Medicine | Admitting: Internal Medicine

## 2018-03-11 DIAGNOSIS — Z780 Asymptomatic menopausal state: Secondary | ICD-10-CM | POA: Diagnosis not present

## 2018-03-11 DIAGNOSIS — M8589 Other specified disorders of bone density and structure, multiple sites: Secondary | ICD-10-CM | POA: Diagnosis not present

## 2018-03-19 DIAGNOSIS — R0982 Postnasal drip: Secondary | ICD-10-CM | POA: Diagnosis not present

## 2018-03-19 DIAGNOSIS — J309 Allergic rhinitis, unspecified: Secondary | ICD-10-CM | POA: Diagnosis not present

## 2018-04-17 DIAGNOSIS — J019 Acute sinusitis, unspecified: Secondary | ICD-10-CM | POA: Diagnosis not present

## 2018-04-17 DIAGNOSIS — H9201 Otalgia, right ear: Secondary | ICD-10-CM | POA: Diagnosis not present

## 2018-05-09 DIAGNOSIS — Z0000 Encounter for general adult medical examination without abnormal findings: Secondary | ICD-10-CM | POA: Diagnosis not present

## 2018-07-15 DIAGNOSIS — M170 Bilateral primary osteoarthritis of knee: Secondary | ICD-10-CM | POA: Diagnosis not present

## 2018-08-04 DIAGNOSIS — J019 Acute sinusitis, unspecified: Secondary | ICD-10-CM | POA: Diagnosis not present

## 2018-08-18 DIAGNOSIS — J019 Acute sinusitis, unspecified: Secondary | ICD-10-CM | POA: Diagnosis not present

## 2018-09-16 DIAGNOSIS — J019 Acute sinusitis, unspecified: Secondary | ICD-10-CM | POA: Diagnosis not present

## 2018-10-06 DIAGNOSIS — R1013 Epigastric pain: Secondary | ICD-10-CM | POA: Diagnosis not present

## 2018-10-17 DIAGNOSIS — R51 Headache with orthostatic component, not elsewhere classified: Secondary | ICD-10-CM | POA: Diagnosis not present

## 2018-10-17 DIAGNOSIS — M542 Cervicalgia: Secondary | ICD-10-CM | POA: Diagnosis not present

## 2018-10-20 DIAGNOSIS — M9902 Segmental and somatic dysfunction of thoracic region: Secondary | ICD-10-CM | POA: Diagnosis not present

## 2018-10-20 DIAGNOSIS — M542 Cervicalgia: Secondary | ICD-10-CM | POA: Diagnosis not present

## 2018-10-20 DIAGNOSIS — M9901 Segmental and somatic dysfunction of cervical region: Secondary | ICD-10-CM | POA: Diagnosis not present

## 2018-10-20 DIAGNOSIS — M546 Pain in thoracic spine: Secondary | ICD-10-CM | POA: Diagnosis not present

## 2018-10-22 DIAGNOSIS — M546 Pain in thoracic spine: Secondary | ICD-10-CM | POA: Diagnosis not present

## 2018-10-22 DIAGNOSIS — M9901 Segmental and somatic dysfunction of cervical region: Secondary | ICD-10-CM | POA: Diagnosis not present

## 2018-10-22 DIAGNOSIS — M9902 Segmental and somatic dysfunction of thoracic region: Secondary | ICD-10-CM | POA: Diagnosis not present

## 2018-10-22 DIAGNOSIS — M542 Cervicalgia: Secondary | ICD-10-CM | POA: Diagnosis not present

## 2018-10-27 DIAGNOSIS — M542 Cervicalgia: Secondary | ICD-10-CM | POA: Diagnosis not present

## 2018-10-27 DIAGNOSIS — M546 Pain in thoracic spine: Secondary | ICD-10-CM | POA: Diagnosis not present

## 2018-10-27 DIAGNOSIS — M9901 Segmental and somatic dysfunction of cervical region: Secondary | ICD-10-CM | POA: Diagnosis not present

## 2018-10-27 DIAGNOSIS — M9902 Segmental and somatic dysfunction of thoracic region: Secondary | ICD-10-CM | POA: Diagnosis not present

## 2018-10-31 DIAGNOSIS — M542 Cervicalgia: Secondary | ICD-10-CM | POA: Diagnosis not present

## 2018-10-31 DIAGNOSIS — M9902 Segmental and somatic dysfunction of thoracic region: Secondary | ICD-10-CM | POA: Diagnosis not present

## 2018-10-31 DIAGNOSIS — M546 Pain in thoracic spine: Secondary | ICD-10-CM | POA: Diagnosis not present

## 2018-10-31 DIAGNOSIS — M9901 Segmental and somatic dysfunction of cervical region: Secondary | ICD-10-CM | POA: Diagnosis not present

## 2018-11-03 DIAGNOSIS — J019 Acute sinusitis, unspecified: Secondary | ICD-10-CM | POA: Diagnosis not present

## 2018-11-05 DIAGNOSIS — M9902 Segmental and somatic dysfunction of thoracic region: Secondary | ICD-10-CM | POA: Diagnosis not present

## 2018-11-05 DIAGNOSIS — M9901 Segmental and somatic dysfunction of cervical region: Secondary | ICD-10-CM | POA: Diagnosis not present

## 2018-11-05 DIAGNOSIS — M542 Cervicalgia: Secondary | ICD-10-CM | POA: Diagnosis not present

## 2018-11-05 DIAGNOSIS — M546 Pain in thoracic spine: Secondary | ICD-10-CM | POA: Diagnosis not present

## 2018-11-10 DIAGNOSIS — E785 Hyperlipidemia, unspecified: Secondary | ICD-10-CM | POA: Diagnosis not present

## 2018-11-10 DIAGNOSIS — M9901 Segmental and somatic dysfunction of cervical region: Secondary | ICD-10-CM | POA: Diagnosis not present

## 2018-11-10 DIAGNOSIS — M542 Cervicalgia: Secondary | ICD-10-CM | POA: Diagnosis not present

## 2018-11-10 DIAGNOSIS — M546 Pain in thoracic spine: Secondary | ICD-10-CM | POA: Diagnosis not present

## 2018-11-10 DIAGNOSIS — Z0000 Encounter for general adult medical examination without abnormal findings: Secondary | ICD-10-CM | POA: Diagnosis not present

## 2018-11-10 DIAGNOSIS — M9902 Segmental and somatic dysfunction of thoracic region: Secondary | ICD-10-CM | POA: Diagnosis not present

## 2018-11-12 DIAGNOSIS — M542 Cervicalgia: Secondary | ICD-10-CM | POA: Diagnosis not present

## 2018-11-12 DIAGNOSIS — M546 Pain in thoracic spine: Secondary | ICD-10-CM | POA: Diagnosis not present

## 2018-11-12 DIAGNOSIS — M9902 Segmental and somatic dysfunction of thoracic region: Secondary | ICD-10-CM | POA: Diagnosis not present

## 2018-11-12 DIAGNOSIS — M9901 Segmental and somatic dysfunction of cervical region: Secondary | ICD-10-CM | POA: Diagnosis not present

## 2018-11-17 DIAGNOSIS — E782 Mixed hyperlipidemia: Secondary | ICD-10-CM | POA: Diagnosis not present

## 2018-11-17 DIAGNOSIS — I10 Essential (primary) hypertension: Secondary | ICD-10-CM | POA: Diagnosis not present

## 2018-11-17 DIAGNOSIS — M1712 Unilateral primary osteoarthritis, left knee: Secondary | ICD-10-CM | POA: Diagnosis not present

## 2018-11-17 DIAGNOSIS — Z0001 Encounter for general adult medical examination with abnormal findings: Secondary | ICD-10-CM | POA: Diagnosis not present

## 2018-11-21 DIAGNOSIS — M9901 Segmental and somatic dysfunction of cervical region: Secondary | ICD-10-CM | POA: Diagnosis not present

## 2018-11-21 DIAGNOSIS — M9902 Segmental and somatic dysfunction of thoracic region: Secondary | ICD-10-CM | POA: Diagnosis not present

## 2018-11-21 DIAGNOSIS — M542 Cervicalgia: Secondary | ICD-10-CM | POA: Diagnosis not present

## 2018-11-21 DIAGNOSIS — M546 Pain in thoracic spine: Secondary | ICD-10-CM | POA: Diagnosis not present

## 2018-11-24 DIAGNOSIS — M9901 Segmental and somatic dysfunction of cervical region: Secondary | ICD-10-CM | POA: Diagnosis not present

## 2018-11-24 DIAGNOSIS — M546 Pain in thoracic spine: Secondary | ICD-10-CM | POA: Diagnosis not present

## 2018-11-24 DIAGNOSIS — M9902 Segmental and somatic dysfunction of thoracic region: Secondary | ICD-10-CM | POA: Diagnosis not present

## 2018-11-24 DIAGNOSIS — M542 Cervicalgia: Secondary | ICD-10-CM | POA: Diagnosis not present

## 2018-12-01 DIAGNOSIS — M542 Cervicalgia: Secondary | ICD-10-CM | POA: Diagnosis not present

## 2018-12-01 DIAGNOSIS — M9901 Segmental and somatic dysfunction of cervical region: Secondary | ICD-10-CM | POA: Diagnosis not present

## 2018-12-01 DIAGNOSIS — M9902 Segmental and somatic dysfunction of thoracic region: Secondary | ICD-10-CM | POA: Diagnosis not present

## 2018-12-01 DIAGNOSIS — M546 Pain in thoracic spine: Secondary | ICD-10-CM | POA: Diagnosis not present

## 2018-12-05 ENCOUNTER — Other Ambulatory Visit: Payer: Self-pay

## 2018-12-05 DIAGNOSIS — Z20822 Contact with and (suspected) exposure to covid-19: Secondary | ICD-10-CM

## 2018-12-07 LAB — NOVEL CORONAVIRUS, NAA: SARS-CoV-2, NAA: NOT DETECTED

## 2018-12-20 DIAGNOSIS — J019 Acute sinusitis, unspecified: Secondary | ICD-10-CM | POA: Diagnosis not present

## 2019-01-26 DIAGNOSIS — R1013 Epigastric pain: Secondary | ICD-10-CM | POA: Diagnosis not present

## 2019-01-27 ENCOUNTER — Encounter: Payer: Self-pay | Admitting: Internal Medicine

## 2019-02-17 ENCOUNTER — Telehealth: Payer: Self-pay

## 2019-02-17 ENCOUNTER — Encounter: Payer: Self-pay | Admitting: Internal Medicine

## 2019-02-17 ENCOUNTER — Other Ambulatory Visit: Payer: Self-pay

## 2019-02-17 ENCOUNTER — Ambulatory Visit: Payer: Medicare Other | Admitting: Internal Medicine

## 2019-02-17 VITALS — BP 120/78 | HR 79 | Temp 96.9°F | Ht 65.0 in | Wt 190.8 lb

## 2019-02-17 DIAGNOSIS — K635 Polyp of colon: Secondary | ICD-10-CM | POA: Diagnosis not present

## 2019-02-17 DIAGNOSIS — K219 Gastro-esophageal reflux disease without esophagitis: Secondary | ICD-10-CM

## 2019-02-17 DIAGNOSIS — R1013 Epigastric pain: Secondary | ICD-10-CM

## 2019-02-17 NOTE — Patient Instructions (Signed)
Schedule a Diagnostic EGD -  GERD and dyspepsia - Propofol  Continue omeprazole 40 mg daily for now  Colonoscopy in 2022  Further recommendations to follow

## 2019-02-17 NOTE — Progress Notes (Signed)
Primary Care Physician:  Celene Squibb, MD Primary Gastroenterologist:  Dr. Gala Romney  Pre-Procedure History & Physical: HPI:  Alexandra Hoffman is a 69 y.o. female here for further evaluation of postprandial epigastric bloating and reflux symptoms.  Intermittent in nature.  When she over indulges in high fat containing foods she has the symptoms.  Some improvement going from esomeprazole 20 mg to omeprazole 40 mg daily.  Takes Mobic regularly.  Denies any dysphagia whatsoever.  Patient recalls undergoing an EGD here probably 20+ years ago.  States I performed it.  We cannot currently find any records.  Reasons for doing so not recalled at this time.  Distant history of cholecystectomy and sigmoid resection for complicated diverticulitis with abscess.  History of multiple colonic adenomas removed 2019; due for surveillance 2022.  She notes that she has had dyspeptic/reflux symptoms for many years insidiously worsened more recently.  Past Medical History:  Diagnosis Date  . Anxiety   . Arthritis    knees  . Constipation   . Depression   . GERD (gastroesophageal reflux disease)   . Hypertension     Past Surgical History:  Procedure Laterality Date  . COLON SURGERY  2006   perforated sigmoid diverticulitis, Dr Arnoldo Morale  . COLONOSCOPY  2006   Dr. Gala Romney: normal rectum and a few sigmoid diverticula, 7 mm angry pedunculated polyp at 30 cm, 4 mm sessile polyp base of cecum. Unknown path   . COLONOSCOPY WITH PROPOFOL N/A 03/04/2017   Procedure: COLONOSCOPY WITH PROPOFOL;  Surgeon: Daneil Dolin, MD;  Location: [facility];  Service: Endoscopy;  Laterality: N/A;  7:30am  . POLYPECTOMY  03/04/2017   Procedure: POLYPECTOMY;  Surgeon: Daneil Dolin, MD;  Location: [facility];  Service: Endoscopy;;  polyp at cecum and left colon    Prior to Admission medications   Medication Sig Start Date End Date Taking? Authorizing Provider  ALPRAZolam Duanne Moron) 0.5 MG tablet Take 0.5 mg by mouth at bedtime.    Yes [provider]  guaiFENesin (MUCINEX) 600 MG 12 hr tablet Take 600 mg by mouth daily.   Yes [provider]  lisinopril (PRINIVIL,ZESTRIL) 10 MG tablet Take 10 mg by mouth daily.    Yes [provider]  meloxicam (MOBIC) 15 MG tablet Take 15 mg by mouth daily.   Yes [provider]  Omega-3 Fatty Acids (FISH OIL) 1000 MG CAPS Take 1,000 mg by mouth daily.    Yes [provider]  omeprazole (PRILOSEC) 40 MG capsule Take 40 mg by mouth daily.   Yes [provider]  polyethylene glycol (MIRALAX / GLYCOLAX) packet Take 17 g by mouth daily as needed for moderate constipation.   Yes [provider]  vitamin B-12 (CYANOCOBALAMIN) 1000 MCG tablet Take 1,000 mcg by mouth daily.   Yes [provider]  cyanocobalamin 500 MCG tablet Take 500 mcg by mouth daily.    [provider]  esomeprazole (NEXIUM) 20 MG capsule Take 1 capsule (20 mg total) by mouth daily at 12 noon. Patient not taking: Reported on 02/17/2019 02/06/16   Raylene Everts, MD  oseltamivir (TAMIFLU) 75 MG capsule Take 75 mg by mouth daily.    [provider]    Allergies as of 02/17/2019  . (No Known Allergies)    Family History  Problem Relation Age of Onset  . Arthritis Mother   . Depression Mother   . Diabetes Mother   . Heart disease Mother   .  Hypertension Mother   . Stroke Mother   . Kidney disease Mother 54       dialysis  . Alcohol abuse Father   . Arthritis Father   . Cancer Father 70       brain  . Hypertension Father   . Hypertension Sister   . Arthritis Sister   . Hypertension Brother   . Arthritis Brother   . Hypertension Brother   . Arthritis Brother   . Hypertension Brother   . Hypertension Brother   . Hypertension Brother   . Hypertension Brother   . Other Brother        MVA  . Colon cancer Neg Hx     Social History   Socioeconomic History  . Marital status: Widowed    Spouse name: Not on file  .  Number of children: 2  . Years of education: 10  . Highest education level: Not on file  Occupational History  . 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 Smoking status: Never Smoker  . Smokeless tobacco: Never Used  Substance and Sexual Activity  . Alcohol use: No  . Drug use: No  . Sexual activity: Not Currently    Birth control/protection: Post-menopausal  Other Topics Concern  . Not on file  Social History Narrative   Lives alone   Widow   2 grown daughters, 5 grandchildren.   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 Difficulty of Paying Living Expenses: Not on file  Food Insecurity:   . Worried About Charity fundraiser in the Last Year: Not on file  . Ran Out of Food in the Last Year: Not on file  Transportation Needs:   . Lack of Transportation (Medical): Not on file  . Lack of Transportation (Non-Medical): Not on file  Physical Activity:   . Days of Exercise per Week: Not on file  . Minutes of Exercise per Session: Not on file  Stress:   . Feeling of Stress : Not on file  Social Connections:   . Frequency of Communication with Friends and Family: Not on file  . Frequency of Social Gatherings with Friends and Family: Not on file  . Attends Religious Services: Not on file  . Active Member of Clubs or Organizations: Not on file  . Attends Archivist Meetings: Not on file  . Marital Status: Not on file  Intimate Partner Violence:   . Fear of Current or Ex-Partner: Not on file  . Emotionally Abused: Not on file  . Physically Abused: Not on file  . Sexually Abused: Not on file    Review of Systems: See HPI, otherwise negative ROS  Physical Exam: BP 120/78   Pulse 79   Temp (!) 96.9 F (36.1 C) (Oral)   Ht 5\' 5"  (1.651 m)   Wt 190 lb 12.8 oz (86.5 kg)   LMP 01/02/2004 (Approximate)   BMI 31.75 kg/m  General:   Alert,  Well-developed,  well-nourished, pleasant and cooperative in NAD nopathy. Lungs:  Clear throughout to auscultation.   No wheezes, crackles, or rhonchi. No acute distress. Heart:  Regular rate and rhythm; no murmurs, clicks, rubs,  or gallops. Abdomen: Non-distended, normal bowel sounds.  Soft and nontender without appreciable mass or hepatosplenomegaly.  Pulses:  Normal pulses noted. Extremities:  Without clubbing or edema.  Impression/Plan: 69 year old lady with  symptoms consistent with GERD/dyspepsia.  Recent NSAID use.  Some blunting in her symptoms with escalating dosing PPI therapy.  Longstanding GERD.  History of distant EGD. Symptoms of GERD somewhat crescendo as are dyspepsia symptoms.  NSAID use. She should have a look in her upper GI tract at this time.   Recommendations:  I've offered the patient a diagnostic EGD.  The risks, benefits, limitations, alternatives and imponderables have been reviewed with the patient. Potential for esophageal dilation, biopsy, etc. have also been reviewed.  Questions have been answered. All parties agreeable.  We will utilize propofol.  Continue omeprazole 40 mg daily for now  Colonoscopy in 2022  Further recommendations to follow      Notice: This dictation was prepared with Dragon dictation along with smaller phrase technology. Any transcriptional errors that result from this process are unintentional and may not be corrected upon review.

## 2019-02-17 NOTE — Telephone Encounter (Signed)
PA for EGD submitted via [facility] website. Case approved. PA# [id number], valid 03/19/19-06/17/19.

## 2019-03-16 ENCOUNTER — Encounter (HOSPITAL_COMMUNITY)
Admission: RE | Admit: 2019-03-16 | Discharge: 2019-03-16 | Disposition: A | Discharge status: Home / Self Care | Payer: Medicare Other | Source: Ambulatory Visit | Attending: Internal Medicine | Admitting: Internal Medicine

## 2019-03-16 ENCOUNTER — Other Ambulatory Visit: Payer: Self-pay

## 2019-03-17 ENCOUNTER — Other Ambulatory Visit (HOSPITAL_COMMUNITY): Payer: Medicare Other

## 2019-03-17 ENCOUNTER — Other Ambulatory Visit: Payer: Self-pay

## 2019-03-17 ENCOUNTER — Other Ambulatory Visit (HOSPITAL_COMMUNITY)
Admission: RE | Admit: 2019-03-17 | Discharge: 2019-03-17 | Disposition: A | Discharge status: Home / Self Care | Payer: Medicare Other | Source: Ambulatory Visit | Attending: Internal Medicine | Admitting: Internal Medicine

## 2019-03-17 DIAGNOSIS — Z01812 Encounter for preprocedural laboratory examination: Secondary | ICD-10-CM | POA: Diagnosis not present

## 2019-03-17 DIAGNOSIS — Z20822 Contact with and (suspected) exposure to covid-19: Secondary | ICD-10-CM | POA: Diagnosis not present

## 2019-03-17 LAB — SARS CORONAVIRUS 2 (TAT 6-24 HRS): SARS Coronavirus 2: NEGATIVE

## 2019-03-18 ENCOUNTER — Telehealth: Payer: Self-pay

## 2019-03-18 NOTE — Telephone Encounter (Signed)
Called pt, EGD for tomorrow moved up to 1:15pm. Advised her to arrive at 11:45am. NPO after 9:00am tomorrow. LMOVM for endo scheduler.

## 2019-03-19 ENCOUNTER — Encounter (HOSPITAL_COMMUNITY)
Admission: RE | Disposition: A | Discharge status: Home / Self Care | Payer: Self-pay | Source: Home / Self Care | Attending: Internal Medicine

## 2019-03-19 ENCOUNTER — Ambulatory Visit (HOSPITAL_COMMUNITY): Payer: Medicare Other | Admitting: Anesthesiology

## 2019-03-19 ENCOUNTER — Ambulatory Visit (HOSPITAL_COMMUNITY)
Admission: RE | Admit: 2019-03-19 | Discharge: 2019-03-19 | Disposition: A | Discharge status: Home / Self Care | Payer: Medicare Other | Attending: Internal Medicine | Admitting: Internal Medicine

## 2019-03-19 DIAGNOSIS — R1013 Epigastric pain: Secondary | ICD-10-CM | POA: Diagnosis not present

## 2019-03-19 DIAGNOSIS — K449 Diaphragmatic hernia without obstruction or gangrene: Secondary | ICD-10-CM | POA: Insufficient documentation

## 2019-03-19 DIAGNOSIS — Z8261 Family history of arthritis: Secondary | ICD-10-CM | POA: Diagnosis not present

## 2019-03-19 DIAGNOSIS — Z8249 Family history of ischemic heart disease and other diseases of the circulatory system: Secondary | ICD-10-CM | POA: Insufficient documentation

## 2019-03-19 DIAGNOSIS — I10 Essential (primary) hypertension: Secondary | ICD-10-CM | POA: Diagnosis not present

## 2019-03-19 DIAGNOSIS — F419 Anxiety disorder, unspecified: Secondary | ICD-10-CM | POA: Diagnosis not present

## 2019-03-19 DIAGNOSIS — Z79899 Other long term (current) drug therapy: Secondary | ICD-10-CM | POA: Insufficient documentation

## 2019-03-19 DIAGNOSIS — K317 Polyp of stomach and duodenum: Secondary | ICD-10-CM | POA: Insufficient documentation

## 2019-03-19 DIAGNOSIS — K219 Gastro-esophageal reflux disease without esophagitis: Secondary | ICD-10-CM | POA: Insufficient documentation

## 2019-03-19 DIAGNOSIS — F329 Major depressive disorder, single episode, unspecified: Secondary | ICD-10-CM | POA: Diagnosis not present

## 2019-03-19 DIAGNOSIS — M170 Bilateral primary osteoarthritis of knee: Secondary | ICD-10-CM | POA: Diagnosis not present

## 2019-03-19 DIAGNOSIS — Z791 Long term (current) use of non-steroidal anti-inflammatories (NSAID): Secondary | ICD-10-CM | POA: Insufficient documentation

## 2019-03-19 HISTORY — PX: ESOPHAGOGASTRODUODENOSCOPY (EGD) WITH PROPOFOL: SHX5813

## 2019-03-19 HISTORY — PX: POLYPECTOMY: SHX5525

## 2019-03-19 SURGERY — ESOPHAGOGASTRODUODENOSCOPY (EGD) WITH PROPOFOL
Anesthesia: General

## 2019-03-19 MED ORDER — KETAMINE HCL 10 MG/ML IJ SOLN
INTRAMUSCULAR | Status: DC | PRN
  Administered 2019-03-19: 20 mg via INTRAVENOUS

## 2019-03-19 MED ORDER — KETAMINE HCL 50 MG/5ML IJ SOSY
PREFILLED_SYRINGE | INTRAMUSCULAR | Status: AC
  Filled 2019-03-19: qty 5

## 2019-03-19 MED ORDER — PROPOFOL 500 MG/50ML IV EMUL
INTRAVENOUS | Status: DC | PRN
  Administered 2019-03-19: 150 ug/kg/min via INTRAVENOUS

## 2019-03-19 MED ORDER — PROPOFOL 10 MG/ML IV BOLUS
INTRAVENOUS | Status: DC | PRN
  Administered 2019-03-19: 40 mg via INTRAVENOUS

## 2019-03-19 MED ORDER — LACTATED RINGERS IV SOLN: Freq: Once | INTRAVENOUS | Status: AC

## 2019-03-19 MED ORDER — LACTATED RINGERS IV SOLN: INTRAVENOUS | Status: DC | PRN

## 2019-03-19 NOTE — Op Note (Signed)
[facility] Patient Name: Alexandra Hoffman Procedure Date: 03/19/2019 1:52 PM MRN: [id number] Date of Birth: 31-Mar-1950 Attending MD: Norvel Richards , MD CSN: [id number] Age: 69 Admit Type: Outpatient Procedure:                Upper GI endoscopy Indications:              Dyspepsia Providers:                Norvel Richards, MD, Jeanann Lewandowsky. Sharon Seller, RN,                            Randa Spike, Technician Referring MD:              Medicines:                Propofol per Anesthesia Complications:            No immediate complications. Estimated Blood Loss:     Estimated blood loss was minimal. Procedure:                Pre-Anesthesia Assessment:                           - Prior to the procedure, a History and Physical                            was performed, and patient medications and                            allergies were reviewed. The patient's tolerance of                            previous anesthesia was also reviewed. The risks                            and benefits of the procedure and the sedation                            options and risks were discussed with the patient.                            All questions were answered, and informed consent                            was obtained. Prior Anticoagulants: The patient has                            taken no previous anticoagulant or antiplatelet                            agents. ASA Grade Assessment: II - A patient with                            mild systemic disease. After reviewing the risks  and benefits, the patient was deemed in                            satisfactory condition to undergo the procedure.                           After obtaining informed consent, the endoscope was                            passed under direct vision. Throughout the                            procedure, the patient's blood pressure, pulse, and                            oxygen saturations  were monitored continuously. The                            [id number]-[id number] scope was introduced through the                            mouth, and advanced to the second part of duodenum.                            The upper GI endoscopy was accomplished without                            difficulty. The patient tolerated the procedure                            well. Scope In: 2:09:35 PM Scope Out: 2:12:57 PM Total Procedure Duration: 0 hours 3 minutes 22 seconds  Findings:      The examined esophagus was normal.      A small hiatal hernia was present. Scattered 5 to 8 mm hyperplastic       appearing polyps found in the stomach. No ulcer or infiltrating process.       Patent pylorus. Largest polyp was biopsied/removed with a cold forceps       for histology. Estimated blood loss was minimal.      The duodenal bulb and second portion of the duodenum were normal. Impression:               - Normal esophagus.                           - Small hiatal hernia. Gastric polyps. Biopsied.                           - Normal duodenal bulb and second portion of the                            duodenum. Moderate Sedation:      Moderate (conscious) sedation was personally administered by an       anesthesia professional. The following parameters were monitored: oxygen       saturation, heart rate, blood  pressure, respiratory rate, EKG, adequacy       of pulmonary ventilation, and response to care. Recommendation:           -                           - Advance diet as tolerated.                           - Continue present medications. Stop omeprazole.                            Begin Protonix 40 mg once daily. Follow-up on                            pathology.                           - Return to my office in 3 months. Procedure Code(s):        --- Professional ---                           [phone], Esophagogastroduodenoscopy, flexible,                            transoral; with biopsy, single  or multiple Diagnosis Code(s):        --- Professional ---                           K44.9, Diaphragmatic hernia without obstruction or                            gangrene                           R10.13, Epigastric pain CPT copyright 2019 American Medical Association. All rights reserved. The codes documented in this report are preliminary and upon coder review may  be revised to meet current compliance requirements. [name]. Yessenia Maillet, MD Norvel Richards, MD 03/19/2019 2:24:17 PM This report has been signed electronically. Number of Addenda: 0

## 2019-03-19 NOTE — Anesthesia Procedure Notes (Signed)
Date/Time: 03/19/2019 2:02 PM Performed by: Vista Deck, CRNA Pre-anesthesia Checklist: Patient identified, Emergency Drugs available, Suction available, Timeout performed and Patient being monitored Patient Re-evaluated:Patient Re-evaluated prior to induction Oxygen Delivery Method: Nasal Cannula

## 2019-03-19 NOTE — Discharge Instructions (Signed)
EGD Discharge instructions Please read the instructions outlined below and refer to this sheet in the next few weeks. These discharge instructions provide you with general information on caring for yourself after you leave the hospital. Your doctor may also give you specific instructions. While your treatment has been planned according to the most current medical practices available, unavoidable complications occasionally occur. If you have any problems or questions after discharge, please call your doctor. ACTIVITY  You may resume your regular activity but move at a slower pace for the next 24 hours.   Take frequent rest periods for the next 24 hours.   Walking will help expel (get rid of) the air and reduce the bloated feeling in your abdomen.   No driving for 24 hours (because of the anesthesia (medicine) used during the test).   You may shower.   Do not sign any important legal documents or operate any machinery for 24 hours (because of the anesthesia used during the test).  NUTRITION  Drink plenty of fluids.   You may resume your normal diet.   Begin with a light meal and progress to your normal diet.   Avoid alcoholic beverages for 24 hours or as instructed by your caregiver.  MEDICATIONS  You may resume your normal medications unless your caregiver tells you otherwise.  WHAT YOU CAN EXPECT TODAY  You may experience abdominal discomfort such as a feeling of fullness or "gas" pains.  FOLLOW-UP  Your doctor will discuss the results of your test with you.  SEEK IMMEDIATE MEDICAL ATTENTION IF ANY OF THE FOLLOWING OCCUR:  Excessive nausea (feeling sick to your stomach) and/or vomiting.   Severe abdominal pain and distention (swelling).   Trouble swallowing.   Temperature over 101 F (37.8 C).   Rectal bleeding or vomiting of blood.    GERD and hiatal hernia information provided  Stop omeprazole; begin Protonix 40 mg daily before breakfast-prescription  provided.  Further recommendations to follow pending review of pathology report  Office visit with [place] in 3 months  At patient request, I called Mick Sell at [phone] and reviewed results.

## 2019-03-19 NOTE — Anesthesia Preprocedure Evaluation (Signed)
Anesthesia Evaluation  Patient identified by MRN, date of birth, ID band Patient awake    Reviewed: Allergy & Precautions, NPO status , Patient's Chart, lab work & pertinent test results  History of Anesthesia Complications Negative for: history of anesthetic complications  Airway Mallampati: III  TM Distance: >3 FB Neck ROM: Full    Dental no notable dental hx. (+) Teeth Intact   Pulmonary sleep apnea (snoring) ,    Pulmonary exam normal breath sounds clear to auscultation       Cardiovascular hypertension, Pt. on medications  Rhythm:Regular Rate:Normal     Neuro/Psych PSYCHIATRIC DISORDERS Anxiety Depression    GI/Hepatic Neg liver ROS, GERD  Medicated and Controlled,  Endo/Other  negative endocrine ROS  Renal/GU negative Renal ROS     Musculoskeletal  (+) Arthritis ,   Abdominal   Peds  Hematology negative hematology ROS (+)   Anesthesia Other Findings   Reproductive/Obstetrics                             Anesthesia Physical Anesthesia Plan  ASA: II  Anesthesia Plan: General   Post-op Pain Management:    Induction: Intravenous  PONV Risk Score and Plan: 2 and TIVA and Treatment may vary due to age or medical condition  Airway Management Planned: Nasal Cannula, Natural Airway and Simple Face Mask  Additional Equipment:   Intra-op Plan:   Post-operative Plan:   Informed Consent: I have reviewed the patients History and Physical, chart, labs and discussed the procedure including the risks, benefits and alternatives for the proposed anesthesia with the patient or authorized representative who has indicated his/her understanding and acceptance.     Dental advisory given  Plan Discussed with: CRNA and Surgeon  Anesthesia Plan Comments:         Anesthesia Quick Evaluation

## 2019-03-19 NOTE — H&P (Signed)
@LOGO @   Primary Care Physician:  Celene Squibb, MD Primary Gastroenterologist:  Dr. Gala Romney  Pre-Procedure History & Physical: HPI:  Alexandra Hoffman is a 69 y.o. female here for further evaluation of somewhat crescendo of dyspepsia/reflux symptoms.  No melena or dysphagia.  Past Medical History:  Diagnosis Date  . Anxiety   . Arthritis    knees  . Constipation   . Depression   . GERD (gastroesophageal reflux disease)   . Hypertension     Past Surgical History:  Procedure Laterality Date  . COLON SURGERY  2006   perforated sigmoid diverticulitis, Dr Arnoldo Morale  . COLONOSCOPY  2006   Dr. Gala Romney: normal rectum and a few sigmoid diverticula, 7 mm angry pedunculated polyp at 30 cm, 4 mm sessile polyp base of cecum. Unknown path   . COLONOSCOPY WITH PROPOFOL N/A 03/04/2017   Procedure: COLONOSCOPY WITH PROPOFOL;  Surgeon: Daneil Dolin, MD;  Location: [facility];  Service: Endoscopy;  Laterality: N/A;  7:30am  . POLYPECTOMY  03/04/2017   Procedure: POLYPECTOMY;  Surgeon: Daneil Dolin, MD;  Location: [facility];  Service: Endoscopy;;  polyp at cecum and left colon    Prior to Admission medications   Medication Sig Start Date End Date Taking? Authorizing Provider  ALPRAZolam Duanne Moron) 0.5 MG tablet Take 0.5 mg by mouth at bedtime.   Yes [provider]  guaiFENesin (MUCINEX) 600 MG 12 hr tablet Take 600 mg by mouth every other day.    Yes [provider]  lisinopril (PRINIVIL,ZESTRIL) 10 MG tablet Take 10 mg by mouth daily.    Yes [provider]  meloxicam (MOBIC) 15 MG tablet Take 15 mg by mouth every evening.    Yes [provider]  Omega-3 Fatty Acids (FISH OIL) 1000 MG CAPS Take 1,000 mg by mouth daily.    Yes [provider]  omeprazole (PRILOSEC) 40 MG capsule Take 40 mg by mouth daily.   Yes [provider]  vitamin B-12 (CYANOCOBALAMIN) 1000 MCG tablet Take 1,000 mcg by mouth daily.   Yes [provider]  Wheat  Dextrin (BENEFIBER PO) Take 1 Dose by mouth every evening.   Yes [provider]  polyethylene glycol (MIRALAX / GLYCOLAX) packet Take 17 g by mouth daily as needed for moderate constipation.    [provider]    Allergies as of 02/17/2019  . (No Known Allergies)    Family History  Problem Relation Age of Onset  . Arthritis Mother   . Depression Mother   . Diabetes Mother   . Heart disease Mother   . Hypertension Mother   . Stroke Mother   . Kidney disease Mother 87       dialysis  . Alcohol abuse Father   . Arthritis Father   . Cancer Father 25       brain  . Hypertension Father   . Hypertension Sister   . Arthritis Sister   . Hypertension Brother   . Arthritis Brother   . Hypertension Brother   . Arthritis Brother   . Hypertension Brother   . Hypertension Brother   . Hypertension Brother   . Hypertension Brother   . Other Brother        MVA  . Colon cancer Neg Hx     Social History   Socioeconomic History  . Marital status: Widowed    Spouse name: Not on file  . Number of children: 2  . Years of education:  10  . Highest education level: Not on file  Occupational History  . 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 Smoking status: Never Smoker  . Smokeless tobacco: Never Used  Substance and Sexual Activity  . Alcohol use: No  . Drug use: No  . Sexual activity: Not Currently    Birth control/protection: Post-menopausal  Other Topics Concern  . Not on file  Social History Narrative   Lives alone   Widow   2 grown daughters, 5 grandchildren.   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 Difficulty of Paying Living Expenses:   Food Insecurity:   . Worried About Charity fundraiser in the Last Year:   . Arboriculturist in the Last Year:   Transportation Needs:   . Film/video editor (Medical):   Marland Kitchen Lack of Transportation  (Non-Medical):   Physical Activity:   . Days of Exercise per Week:   . Minutes of Exercise per Session:   Stress:   . Feeling of Stress :   Social Connections:   . Frequency of Communication with Friends and Family:   . Frequency of Social Gatherings with Friends and Family:   . Attends Religious Services:   . Active Member of Clubs or Organizations:   . Attends Archivist Meetings:   Marland Kitchen Marital Status:   Intimate Partner Violence:   . Fear of Current or Ex-Partner:   . Emotionally Abused:   Marland Kitchen Physically Abused:   . Sexually Abused:     Review of Systems: See HPI, otherwise negative ROS  Physical Exam: BP (!) 147/83 (BP Location: Right Arm)   Pulse 73   Temp 98.7 F (37.1 C)   Resp 18   LMP 01/02/2004 (Approximate)   SpO2 97%  General:   Alert,  Well-developed, well-nourished, pleasant and cooperative in NAD Neck:  Supple; no masses or thyromegaly. No significant cervical adenopathy. Lungs:  Clear throughout to auscultation.   No wheezes, crackles, or rhonchi. No acute distress. Heart:  Regular rate and rhythm; no murmurs, clicks, rubs,  or gallops. Abdomen: Non-distended, normal bowel sounds.  Soft and nontender without appreciable mass or hepatosplenomegaly.  Pulses:  Normal pulses noted. Extremities:  Without clubbing or edema.  Impression/Plan: 69 year old lady with worsening dyspepsia/reflux symptoms in the setting of NSAID use and in spite of concomitant PPI therapy.  No dysphagia.  Diagnostic EGD offered at this time per plan. The risks, benefits, limitations, alternatives and imponderables have been reviewed with the patient. Potential for esophageal dilation, biopsy, etc. have also been reviewed.  Questions have been answered. All parties agreeable.     Notice: This dictation was prepared with Dragon dictation along with smaller phrase technology. Any transcriptional errors that result from this process are unintentional and may not be corrected upon  review.

## 2019-03-19 NOTE — Anesthesia Postprocedure Evaluation (Signed)
Anesthesia Post Note  Patient: Alexandra Hoffman  Procedure(s) Performed: ESOPHAGOGASTRODUODENOSCOPY (EGD) WITH PROPOFOL (N/A ) POLYPECTOMY  Patient location during evaluation: PACU Anesthesia Type: General Level of consciousness: awake and alert and patient cooperative Pain management: satisfactory to patient Vital Signs Assessment: post-procedure vital signs reviewed and stable Respiratory status: spontaneous breathing Cardiovascular status: stable Postop Assessment: no apparent nausea or vomiting Anesthetic complications: no     Last Vitals:  Vitals:   03/19/19 1245 03/19/19 1420  BP: (!) 147/83 (!) 127/55  Pulse: 73 75  Resp: 18 (!) 23  Temp: 37.1 C 36.6 C  SpO2: 97% 95%    Last Pain:  Vitals:   03/19/19 1420  PainSc: 0-No pain                 Dayne Chait

## 2019-03-19 NOTE — Transfer of Care (Signed)
Immediate Anesthesia Transfer of Care Note  Patient: Alexandra Hoffman  Procedure(s) Performed: ESOPHAGOGASTRODUODENOSCOPY (EGD) WITH PROPOFOL (N/A ) POLYPECTOMY  Patient Location: PACU  Anesthesia Type:General  Level of Consciousness: awake, alert  and patient cooperative  Airway & Oxygen Therapy: Patient Spontanous Breathing  Post-op Assessment: Report given to RN and Post -op Vital signs reviewed and stable  Post vital signs: Reviewed and stable  Last Vitals:  Vitals Value Taken Time  BP    Temp 97.8   Pulse 75 03/19/19 1420  Resp 23 03/19/19 1420  SpO2 95 % 03/19/19 1420  Vitals shown include unvalidated device data.  Last Pain:  Vitals:   03/19/19 1404  PainSc: 0-No pain         Complications: No apparent anesthesia complications

## 2019-03-23 LAB — SURGICAL PATHOLOGY

## 2019-03-24 ENCOUNTER — Encounter: Payer: Self-pay | Admitting: Internal Medicine

## 2019-03-25 ENCOUNTER — Encounter: Payer: Self-pay | Admitting: Internal Medicine

## 2019-04-17 DIAGNOSIS — J019 Acute sinusitis, unspecified: Secondary | ICD-10-CM | POA: Diagnosis not present

## 2019-05-12 DIAGNOSIS — M170 Bilateral primary osteoarthritis of knee: Secondary | ICD-10-CM | POA: Diagnosis not present

## 2019-05-25 DIAGNOSIS — E559 Vitamin D deficiency, unspecified: Secondary | ICD-10-CM | POA: Diagnosis not present

## 2019-05-25 DIAGNOSIS — E782 Mixed hyperlipidemia: Secondary | ICD-10-CM | POA: Diagnosis not present

## 2019-05-25 DIAGNOSIS — E785 Hyperlipidemia, unspecified: Secondary | ICD-10-CM | POA: Diagnosis not present

## 2019-06-02 ENCOUNTER — Other Ambulatory Visit (HOSPITAL_COMMUNITY): Payer: Self-pay | Admitting: Internal Medicine

## 2019-06-02 DIAGNOSIS — Z0001 Encounter for general adult medical examination with abnormal findings: Secondary | ICD-10-CM | POA: Diagnosis not present

## 2019-06-02 DIAGNOSIS — Z0000 Encounter for general adult medical examination without abnormal findings: Secondary | ICD-10-CM | POA: Diagnosis not present

## 2019-06-02 DIAGNOSIS — Z1231 Encounter for screening mammogram for malignant neoplasm of breast: Secondary | ICD-10-CM

## 2019-06-02 DIAGNOSIS — E782 Mixed hyperlipidemia: Secondary | ICD-10-CM | POA: Diagnosis not present

## 2019-06-18 ENCOUNTER — Ambulatory Visit (HOSPITAL_COMMUNITY): Payer: Medicare Other

## 2019-06-22 ENCOUNTER — Other Ambulatory Visit: Payer: Self-pay

## 2019-06-22 ENCOUNTER — Encounter: Payer: Self-pay | Admitting: Gastroenterology

## 2019-06-22 ENCOUNTER — Ambulatory Visit: Payer: Medicare Other | Admitting: Gastroenterology

## 2019-06-22 VITALS — BP 149/96 | HR 79 | Temp 97.3°F | Ht 66.0 in | Wt 195.8 lb

## 2019-06-22 DIAGNOSIS — K5900 Constipation, unspecified: Secondary | ICD-10-CM

## 2019-06-22 DIAGNOSIS — R140 Abdominal distension (gaseous): Secondary | ICD-10-CM

## 2019-06-22 MED ORDER — LUBIPROSTONE 8 MCG PO CAPS: 8.0000 ug | ORAL_CAPSULE | Freq: Two times a day (BID) | ORAL | 3 refills | Status: DC

## 2019-06-22 NOTE — Patient Instructions (Addendum)
1. Stop Miralax. Start Amitiza 29mcg twice daily with food for constipation. Samples provided and RX sent to your pharmacy.  2. Refer to the [place] listed below as well as separate handout. If you are consuming any foods from the [place] list or the foods to avoid list, please cut back or eliminate to see if this helps with your gas and bloating.  3. Return to the office in 3 months. If you are doing well and do not want to pursue colonoscopy before 03/2020, you can cancel appointment. If you are still having problems and desire colonoscopy before the end of this year, keep the appointment in 09/2019. Please call at any time with questions or concerns.    Low-FODMAP Eating Plan  FODMAPs (fermentable oligosaccharides, disaccharides, monosaccharides, and polyols) are sugars that are hard for some people to digest. A low-FODMAP eating plan may help some people who have bowel (intestinal) diseases to manage their symptoms. This meal plan can be complicated to follow. Work with a diet and nutrition specialist (dietitian) to make a low-FODMAP eating plan that is right for you. A dietitian can make sure that you get enough nutrition from this diet. What are tips for following this plan? Reading food labels  Check labels for hidden FODMAPs such as: ? High-fructose syrup. ? Honey. ? Agave. ? Natural fruit flavors. ? Onion or garlic powder.  Choose low-FODMAP foods that contain 3-4 grams of fiber per serving.  Check food labels for serving sizes. Eat only one serving at a time to make sure FODMAP levels stay low. Meal planning  Follow a low-FODMAP eating plan for up to 6 weeks, or as told by your health care provider or dietitian.  To follow the eating plan: 1. Eliminate high-FODMAP foods from your diet completely. 2. Gradually reintroduce high-FODMAP foods into your diet one at a time. Most people should wait a few days after introducing one high-FODMAP food before they introduce the next  high-FODMAP food. Your dietitian can recommend how quickly you may reintroduce foods. 3. Keep a daily record of what you eat and drink, and make note of any symptoms that you have after eating. 4. Review your daily record with a dietitian regularly. Your dietitian can help you identify which foods you can eat and which foods you should avoid. General tips  Drink enough fluid each day to keep your urine pale yellow.  Avoid processed foods. These often have added sugar and may be high in FODMAPs.  Avoid most dairy products, whole grains, and sweeteners.  Work with a dietitian to make sure you get enough fiber in your diet. Recommended foods Grains  Gluten-free grains, such as rice, oats, buckwheat, quinoa, corn, polenta, and millet. Gluten-free pasta, bread, or cereal. Rice noodles. Corn tortillas. Vegetables  Eggplant, zucchini, cucumber, peppers, green beans, Brussels sprouts, bean sprouts, lettuce, arugula, kale, Swiss chard, spinach, collard greens, bok choy, summer squash, potato, and tomato. Limited amounts of corn, carrot, and sweet potato. Green parts of scallions. Fruits  Bananas, oranges, lemons, limes, blueberries, raspberries, strawberries, grapes, cantaloupe, honeydew melon, kiwi, papaya, passion fruit, and pineapple. Limited amounts of dried cranberries, banana chips, and shredded coconut. Dairy  Lactose-free milk, yogurt, and kefir. Lactose-free cottage cheese and ice cream. Non-dairy milks, such as almond, coconut, hemp, and rice milk. Yogurts made of non-dairy milks. Limited amounts of goat cheese, brie, mozzarella, parmesan, swiss, and other hard cheeses. Meats and other protein foods  Unseasoned beef, pork, poultry, or fish. Eggs. Berniece Salines. Tofu (firm)  and tempeh. Limited amounts of nuts and seeds, such as almonds, walnuts, [place] nuts, pecans, peanuts, pumpkin seeds, chia seeds, and sunflower seeds. Fats and oils  Butter-free spreads. Vegetable oils, such as olive,  canola, and sunflower oil. Seasoning and other foods  Artificial sweeteners with names that do not end in "ol" such as aspartame, saccharine, and stevia. Maple syrup, white table sugar, raw sugar, brown sugar, and molasses. Fresh basil, coriander, parsley, rosemary, and thyme. Beverages  Water and mineral water. Sugar-sweetened soft drinks. Small amounts of orange juice or cranberry juice. Black and green tea. Most dry wines. Coffee. This may not be a complete list of low-FODMAP foods. Talk with your dietitian for more information.   Foods to avoid Grains  Wheat, including kamut, durum, and semolina. Barley and bulgur. Couscous. Wheat-based cereals. Wheat noodles, bread, crackers, and pastries. Vegetables  Chicory root, artichoke, asparagus, cabbage, snow peas, sugar snap peas, mushrooms, and cauliflower. Onions, garlic, leeks, and the white part of scallions. Fruits  Fresh, dried, and juiced forms of apple, pear, watermelon, peach, plum, cherries, apricots, blackberries, boysenberries, figs, nectarines, and mango. Avocado. Dairy  Milk, yogurt, ice cream, and soft cheese. Cream and sour cream. Milk-based sauces. Custard. Meats and other protein foods  Fried or fatty meat. Sausage. Cashews and pistachios. Soybeans, baked beans, black beans, chickpeas, kidney beans, fava beans, navy beans, lentils, and split peas. Seasoning and other foods  Any sugar-free gum or candy. Foods that contain artificial sweeteners such as sorbitol, mannitol, isomalt, or xylitol. Foods that contain honey, high-fructose corn syrup, or agave. Bouillon, vegetable stock, beef stock, and chicken stock. Garlic and onion powder. Condiments made with onion, such as hummus, chutney, pickles, relish, salad dressing, and salsa. Tomato paste. Beverages  Chicory-based drinks. Coffee substitutes. Chamomile tea. Fennel tea. Sweet or fortified wines such as port or sherry. Diet soft drinks made with isomalt, mannitol,  maltitol, sorbitol, or xylitol. Apple, pear, and mango juice. Juices with high-fructose corn syrup. This may not be a complete list of high-FODMAP foods. Talk with your dietitian to discuss what dietary choices are best for you.  Summary  A low-FODMAP eating plan is a short-term diet that eliminates FODMAPs from your diet to help ease symptoms of certain bowel diseases.  The eating plan usually lasts up to 6 weeks. After that, high-FODMAP foods are restarted gradually, one at a time, so you can find out which may be causing symptoms.  A low-FODMAP eating plan can be complicated. It is best to work with a dietitian who has experience with this type of plan. This information is not intended to replace advice given to you by your health care provider. Make sure you discuss any questions you have with your health care provider. Document Revised: 11/30/2016 Document Reviewed: 08/14/2016 Elsevier Patient Education  [place].

## 2019-06-22 NOTE — Progress Notes (Signed)
Primary Care Physician: Celene Squibb, MD  Primary Gastroenterologist:  Garfield Cornea, MD   Chief Complaint  Patient presents with  . Follow-up    FU from EGD, still some burping and bloating, no more abd pain, Pantoprazole helped abd pain    HPI: Alexandra [facility] is a 69 y.o. female here for follow-up.  Seen back in February for dyspepsia.  Patient taking omeprazole 40 mg daily, Mobic regularly. EGD March 2021 for dyspepsia: Gastric polyps (fundic gland), small hiatal hernia.  Switched to pantoprazole 40 mg daily before breakfast.  Colonoscopy due 03/2020.  Overall she is feeling better.  Her abdominal pain is improved on pantoprazole.  Denies any heartburn.  Biggest complaint of ongoing belching and bloating.  She notes that she is chronically constipated and has been on MiraLAX for years.  Takes MiraLAX every day except for the 2 days that she drives to [place] to work.  Some days she takes 2 capsules.  Still only having a bowel movement twice per week.  Wondering if MiraLAX is causing bloating.  Also has been following weight watchers, consuming a lot of fruit and vegetables.  Complains of bloating mostly in the upper abdomen.  Occasional lower abdominal cramping prior to bowel movement, goes away after bowel movement.  No melena or rectal bleeding.  She is due for colonoscopy in March 2022, 3-year surveillance exam.  She wonders about having a colonoscopy earlier due to bloating, but only if insurance will agree.  Current Outpatient Medications  Medication Sig Dispense Refill  . ALPRAZolam (XANAX) 0.5 MG tablet Take 0.5 mg by mouth at bedtime.    Marland Kitchen guaiFENesin (MUCINEX) 600 MG 12 hr tablet Take 600 mg by mouth daily.     Marland Kitchen lisinopril (PRINIVIL,ZESTRIL) 10 MG tablet Take 10 mg by mouth daily.     . meloxicam (MOBIC) 15 MG tablet Take 15 mg by mouth every other day.     . Omega-3 Fatty Acids (FISH OIL) 1000 MG CAPS Take 1,000 mg by mouth daily. Takes 2 capsules daily.    .  pantoprazole (PROTONIX) 40 MG tablet Take 40 mg by mouth daily.    . polyethylene glycol (MIRALAX / GLYCOLAX) 17 g packet Take 17 g by mouth daily as needed.    . vitamin B-12 (CYANOCOBALAMIN) 1000 MCG tablet Take 1,000 mcg by mouth daily.    . Wheat Dextrin (BENEFIBER PO) Take 1 Dose by mouth every evening.     No current facility-administered medications for this visit.    Allergies as of 06/22/2019  . (No Known Allergies)    ROS:  General: Negative for anorexia, weight loss, fever, chills, fatigue, weakness. ENT: Negative for hoarseness, difficulty swallowing , nasal congestion. CV: Negative for chest pain, angina, palpitations, dyspnea on exertion, peripheral edema.  Respiratory: Negative for dyspnea at rest, dyspnea on exertion, cough, sputum, wheezing.  GI: See history of present illness. GU:  Negative for dysuria, hematuria, urinary incontinence, urinary frequency, nocturnal urination.  Endo: Negative for unusual weight change.    Physical Examination:   BP (!) 149/96   Pulse 79   Temp (!) 97.3 F (36.3 C) (Temporal)   Ht 5\' 6"  (1.676 m)   Wt 195 lb 12.8 oz (88.8 kg)   LMP 01/02/2004 (Approximate)   BMI 31.60 kg/m   General: Well-nourished, well-developed in no acute distress.  Eyes: No icterus. Mouth: masked Lungs: Clear to auscultation bilaterally.  Heart: Regular rate and rhythm, no murmurs rubs or gallops.  Abdomen: Bowel sounds are normal, nontender, nondistended, no hepatosplenomegaly or masses, no abdominal bruits or hernia , no rebound or guarding.   Extremities: No lower extremity edema. No clubbing or deformities. Neuro: Alert and oriented x 4   Skin: Warm and dry, no jaundice.   Psych: Alert and cooperative, normal mood and affect.   Imaging Studies: No results found.   Impression/plan:  69 year old female with history of GERD/dyspepsia in the setting of NSAID use.  EGD as outlined above with small hiatal hernia and fundic gland polyps.  Esophagus  normal.  Overall symptoms improved on pantoprazole 40 mg daily.  Complains of ongoing bloating and chronic constipation which is poorly controlled.  Bloating likely multifactorial in the setting of MiraLAX use, inadequately treated constipation, high FODMAP foods.  Stop MiraLAX.  Begin Amitiza 8 mcg twice daily with food.  Samples and prescription provided.  She is aware that we can titrate dose if needed.  Would also have her evaluate her dietary intake, if she is consuming any foods on the high FODMAP list, would suggest limiting or eliminating to see if her bloating improves.  We will see her back in 3 months but she can call sooner if needed.  She inquired about potentially pursuing colonoscopy before the end of the year in light of bloating symptoms but only if insurance will allow.  If symptoms do not improve, may consider updating colonoscopy sooner than later.  She will keep [place] informed.

## 2019-06-22 NOTE — Progress Notes (Signed)
Cc'ed to pcp °

## 2019-06-30 ENCOUNTER — Telehealth: Payer: Self-pay

## 2019-06-30 NOTE — Telephone Encounter (Signed)
Pt called to discuss the price of Amitiza. Medication was over $100.00 and pt wants to discuss a medication that is less expensive. Amitiza was covered under pts insurance. Pt is aware that I will discuss with her insurance which medication is more cost effective for pt.

## 2019-07-01 NOTE — Telephone Encounter (Signed)
Spoke with pts insurance company. Pts medication was higher due to pts deductible. Amitiza normal price for pt is $47.00 but because pts deductible is high, pt is having to pay $100.00. I asked if our office called in another medication would the price be more than the original price and per pts insurance company yes, due to the deductible.

## 2019-07-06 DIAGNOSIS — J019 Acute sinusitis, unspecified: Secondary | ICD-10-CM | POA: Diagnosis not present

## 2019-07-06 DIAGNOSIS — H6121 Impacted cerumen, right ear: Secondary | ICD-10-CM | POA: Diagnosis not present

## 2019-08-10 ENCOUNTER — Other Ambulatory Visit: Payer: Self-pay | Admitting: Internal Medicine

## 2019-08-31 DIAGNOSIS — J0100 Acute maxillary sinusitis, unspecified: Secondary | ICD-10-CM | POA: Diagnosis not present

## 2019-09-28 ENCOUNTER — Other Ambulatory Visit: Payer: Self-pay

## 2019-09-28 ENCOUNTER — Encounter: Payer: Self-pay | Admitting: Gastroenterology

## 2019-09-28 ENCOUNTER — Ambulatory Visit: Payer: Medicare Other | Admitting: Gastroenterology

## 2019-09-28 VITALS — BP 133/79 | HR 76 | Temp 97.0°F | Ht 66.0 in | Wt 193.4 lb

## 2019-09-28 DIAGNOSIS — R140 Abdominal distension (gaseous): Secondary | ICD-10-CM | POA: Diagnosis not present

## 2019-09-28 DIAGNOSIS — K219 Gastro-esophageal reflux disease without esophagitis: Secondary | ICD-10-CM

## 2019-09-28 DIAGNOSIS — K5900 Constipation, unspecified: Secondary | ICD-10-CM

## 2019-09-28 MED ORDER — PROTONIX 40 MG PO TBEC: 40.0000 mg | DELAYED_RELEASE_TABLET | Freq: Every day | ORAL | 5 refills | Status: DC

## 2019-09-28 MED ORDER — LINACLOTIDE 72 MCG PO CAPS: 72.0000 ug | ORAL_CAPSULE | Freq: Every day | ORAL | 5 refills | Status: DC

## 2019-09-28 NOTE — Progress Notes (Signed)
Primary Care Physician: Celene Squibb, MD  Primary Gastroenterologist:  Garfield Cornea, MD   Chief Complaint  Patient presents with  . Bloated    still having with least little thing she eats  . Constipation    BM's are daily as long as she takes her miralax. Amitiza was too expensive but it did help    HPI: Alexandra [facility] is a 69 y.o. female here for follow-up.  Last seen in June.  History of dyspepsia.  EGD March 2021 showed gastric polyps (fundic gland), small hiatal hernia.  Colonoscopy due in March 2022.  History of chronic constipation.  She was switched from MiraLAX to Amitiza 8 mcg twice daily with food because of potential side effect of bloating with MiraLAX. Unfortunately she was unable to afford medication.   Patient states she is having more reflux symptoms.  When she refilled her pantoprazole, medication looked different but was confirmed to be pantoprazole by her pharmacist.  Likely received a different generic.  Having poorly controlled reflux, taking over-the-counter antacids with it. Previously tried prilosec, nexium. Has never tried dexilant, aciphex.  Denies dysphagia, vomiting.  She has eliminated several foods that increased bloating and gas.  She noted with coming off MiraLAX her bloating seem to improve as well, but she still takes it once a week to help with constipation.  Did not tolerate fiber supplement.  Currently bowel movements about every other day. No melena or rectal bleeding.  Amitiza samples we gave her helped quite a bit but she was not able to afford the medication due to not meeting her deductible.  She states her sister takes Linzess and they have the same insurance so she wants to try getting that.  Wants to wait on colonoscopy until March 2022 as originally recommended.  If her bowel habits and bloating worsen, she will let us know to try to move that up.   Current Outpatient Medications  Medication Sig Dispense Refill  . ALPRAZolam (XANAX) 0.5  MG tablet Take 0.5 mg by mouth at bedtime.    . Cholecalciferol (VITAMIN D3 PO) Take by mouth daily.    Marland Kitchen guaiFENesin (MUCINEX) 600 MG 12 hr tablet Take 600 mg by mouth daily.     Marland Kitchen lisinopril (PRINIVIL,ZESTRIL) 10 MG tablet Take 10 mg by mouth daily.     . meloxicam (MOBIC) 15 MG tablet Take 15 mg by mouth every other day.     . Omega-3 Fatty Acids (FISH OIL) 1000 MG CAPS Take 1,000 mg by mouth daily. Takes 2 capsules daily.    . polyethylene glycol (MIRALAX / GLYCOLAX) 17 g packet Take 17 g by mouth daily.     . vitamin B-12 (CYANOCOBALAMIN) 1000 MCG tablet Take 1,000 mcg by mouth daily.    Marland Kitchen linaclotide (LINZESS) 72 MCG capsule Take 1 capsule (72 mcg total) by mouth daily before breakfast. 30 capsule 5  . PROTONIX 40 MG tablet Take 1 tablet (40 mg total) by mouth daily before breakfast. 30 tablet 5   No current facility-administered medications for this visit.    Allergies as of 09/28/2019  . (No Known Allergies)    ROS:  General: Negative for anorexia, weight loss, fever, chills, fatigue, weakness. ENT: Negative for hoarseness, difficulty swallowing , nasal congestion. CV: Negative for chest pain, angina, palpitations, dyspnea on exertion, peripheral edema.  Respiratory: Negative for dyspnea at rest, dyspnea on exertion, cough, sputum, wheezing.  GI: See history of present illness. GU:  Negative for  dysuria, hematuria, urinary incontinence, urinary frequency, nocturnal urination.  Endo: Negative for unusual weight change.    Physical Examination:   BP 133/79   Pulse 76   Temp (!) 97 F (36.1 C) (Oral)   Ht _0  (1.676 m)   Wt 193 lb 6.4 oz (87.7 kg)   LMP 01/02/2004 (Approximate)   BMI 31.22 kg/m   General: Well-nourished, well-developed in no acute distress.  Eyes: No icterus. Mouth: masked Lungs: Clear to auscultation bilaterally.  Heart: Regular rate and rhythm, no murmurs rubs or gallops.  Abdomen: Bowel sounds are normal, nontender, nondistended, no  hepatosplenomegaly or masses, no abdominal bruits or hernia , no rebound or guarding.   Extremities: No lower extremity edema. No clubbing or deformities. Neuro: Alert and oriented x 4   Skin: Warm and dry, no jaundice.   Psych: Alert and cooperative, normal mood and affect.  Imaging Studies: No results found.   Impression/Plan: Pleasant 69 year old female with history of GERD/dyspepsia, bloating and constipation presenting for follow-up.  GERD: Poorly controlled at this time.  Appears she has had a switch in her generic pantoprazole and has had significant refractory symptoms requiring over-the-counter medications as well.  She would like to try to get namebrand and Protonix, I am not sure how successful we would be.  Next option would be generic AcipHex 20 mg daily.  She will keep me posted.  She can continue Pepcid 20 mg up to twice daily to control refractory symptoms in the meantime.  Constipation: Does not tolerate MiraLAX due to bloating.  Cannot afford Amitiza, she has not met her deductible.  Trial of Linzess 72 mcg daily before breakfast.  Samples and prescription provided.  She notes that her bloating was improved with better management of her constipation and avoidance of trigger foods.  Because of this she would like to postpone colonoscopy for now and wait till surveillance is due in March 2022.  We will see her back in February 2022 for follow-up and to schedule surveillance colonoscopy.

## 2019-09-28 NOTE — Patient Instructions (Addendum)
1. Sent in name brand Protonix to see if we can get covered. If not, we will send in generic aciphex. 2. You can add over the counter pepcid 20mg  up to twice daily to control reflux in the meantime.  3. Trial of Linzess 31mcg daily before breakfast for constipation. RX sent and samples provided.  4. We will see you back in 02/2020 for follow up and schedule your colonoscopy.  5. Call us if you continue to have problems!

## 2019-10-16 DIAGNOSIS — Z23 Encounter for immunization: Secondary | ICD-10-CM | POA: Diagnosis not present

## 2019-11-17 DIAGNOSIS — J019 Acute sinusitis, unspecified: Secondary | ICD-10-CM | POA: Diagnosis not present

## 2019-12-17 DIAGNOSIS — E559 Vitamin D deficiency, unspecified: Secondary | ICD-10-CM | POA: Diagnosis not present

## 2019-12-17 DIAGNOSIS — R51 Headache with orthostatic component, not elsewhere classified: Secondary | ICD-10-CM | POA: Diagnosis not present

## 2019-12-17 DIAGNOSIS — J329 Chronic sinusitis, unspecified: Secondary | ICD-10-CM | POA: Diagnosis not present

## 2019-12-17 DIAGNOSIS — Z0000 Encounter for general adult medical examination without abnormal findings: Secondary | ICD-10-CM | POA: Diagnosis not present

## 2019-12-17 DIAGNOSIS — H9201 Otalgia, right ear: Secondary | ICD-10-CM | POA: Diagnosis not present

## 2019-12-17 DIAGNOSIS — R0982 Postnasal drip: Secondary | ICD-10-CM | POA: Diagnosis not present

## 2019-12-21 DIAGNOSIS — E782 Mixed hyperlipidemia: Secondary | ICD-10-CM | POA: Diagnosis not present

## 2019-12-21 DIAGNOSIS — J329 Chronic sinusitis, unspecified: Secondary | ICD-10-CM | POA: Diagnosis not present

## 2019-12-21 DIAGNOSIS — M1712 Unilateral primary osteoarthritis, left knee: Secondary | ICD-10-CM | POA: Diagnosis not present

## 2019-12-21 DIAGNOSIS — I10 Essential (primary) hypertension: Secondary | ICD-10-CM | POA: Diagnosis not present

## 2020-01-07 ENCOUNTER — Other Ambulatory Visit: Payer: Self-pay

## 2020-01-07 ENCOUNTER — Ambulatory Visit (INDEPENDENT_AMBULATORY_CARE_PROVIDER_SITE_OTHER): Payer: Medicare Other

## 2020-01-07 ENCOUNTER — Ambulatory Visit: Payer: Medicare Other | Admitting: Podiatry

## 2020-01-07 DIAGNOSIS — M722 Plantar fascial fibromatosis: Secondary | ICD-10-CM

## 2020-01-07 DIAGNOSIS — M79671 Pain in right foot: Secondary | ICD-10-CM | POA: Diagnosis not present

## 2020-01-07 DIAGNOSIS — M79672 Pain in left foot: Secondary | ICD-10-CM | POA: Diagnosis not present

## 2020-01-07 MED ORDER — DICLOFENAC SODIUM 1 % EX GEL: 2.0000 g | Freq: Four times a day (QID) | CUTANEOUS | 2 refills | Status: DC

## 2020-01-07 NOTE — Patient Instructions (Signed)

## 2020-01-14 ENCOUNTER — Other Ambulatory Visit: Payer: Self-pay

## 2020-01-14 ENCOUNTER — Ambulatory Visit
Admission: EM | Admit: 2020-01-14 | Discharge: 2020-01-14 | Disposition: A | Discharge status: Home / Self Care | Payer: Medicare Other

## 2020-01-14 DIAGNOSIS — J029 Acute pharyngitis, unspecified: Secondary | ICD-10-CM | POA: Diagnosis not present

## 2020-01-14 DIAGNOSIS — B349 Viral infection, unspecified: Secondary | ICD-10-CM | POA: Diagnosis not present

## 2020-01-14 DIAGNOSIS — R0981 Nasal congestion: Secondary | ICD-10-CM | POA: Diagnosis not present

## 2020-01-14 DIAGNOSIS — Z20822 Contact with and (suspected) exposure to covid-19: Secondary | ICD-10-CM | POA: Diagnosis not present

## 2020-01-14 DIAGNOSIS — R0982 Postnasal drip: Secondary | ICD-10-CM | POA: Diagnosis not present

## 2020-01-14 NOTE — ED Provider Notes (Signed)
[place]   [id number] 01/14/20 Arrival Time: 5809   CC: COVID symptoms  SUBJECTIVE: History from: patient.  Alexandra Hoffman is a 70 y.o. female who presents with positive Covid exposure, scratchy throat and bilateral ear pain since yesterday. Denies sick exposure to COVID, flu or strep. Denies recent travel. Has negative history of Covid. Has had first Covid vaccine x 1 week ago. Has not taken OTC medications for this. There are no aggravating or alleviating factors. Denies previous symptoms in the past. Denies fever, chills, fatigue, sinus pain, sore throat, SOB, wheezing, chest pain, nausea, changes in bowel or bladder habits.    ROS: As per HPI.  All other pertinent ROS negative.     Past Medical History:  Diagnosis Date  . Anxiety   . Arthritis    knees  . Constipation   . Depression   . GERD (gastroesophageal reflux disease)   . Hypertension    Past Surgical History:  Procedure Laterality Date  . COLON SURGERY  2006   perforated sigmoid diverticulitis, Dr Arnoldo Morale  . COLONOSCOPY  2006   Dr. Gala Romney: normal rectum and a few sigmoid diverticula, 7 mm angry pedunculated polyp at 30 cm, 4 mm sessile polyp base of cecum. Unknown path   . COLONOSCOPY WITH PROPOFOL N/A 03/04/2017   Procedure: COLONOSCOPY WITH PROPOFOL;  Surgeon: Daneil Dolin, MD;  Location: [facility];  Service: Endoscopy;  Laterality: N/A;  7:30am  . ESOPHAGOGASTRODUODENOSCOPY (EGD) WITH PROPOFOL N/A 03/19/2019   Rourk: Gastric polyps (biopsy showed fundic gland), small hiatal hernia.  Marland Kitchen POLYPECTOMY  03/04/2017   Procedure: POLYPECTOMY;  Surgeon: Daneil Dolin, MD;  Location: [facility];  Service: Endoscopy;;  polyp at cecum and left colon  . POLYPECTOMY  03/19/2019   Procedure: POLYPECTOMY;  Surgeon: Daneil Dolin, MD;  Location: [facility];  Service: Endoscopy;;  gastric   No Known Allergies No current facility-administered medications on file prior to encounter.   Current Outpatient  Medications on File Prior to Encounter  Medication Sig Dispense Refill  . ALPRAZolam (XANAX) 0.5 MG tablet Take 0.5 mg by mouth at bedtime.    . Cholecalciferol (VITAMIN D3 PO) Take by mouth daily.    . diclofenac Sodium (VOLTAREN) 1 % GEL Apply 2 g topically 4 (four) times daily. Rub into affected area of foot 2 to 4 times daily 100 g 2  . guaiFENesin (MUCINEX) 600 MG 12 hr tablet Take 600 mg by mouth daily.     Marland Kitchen linaclotide (LINZESS) 72 MCG capsule Take 1 capsule (72 mcg total) by mouth daily before breakfast. 30 capsule 5  . lisinopril (PRINIVIL,ZESTRIL) 10 MG tablet Take 10 mg by mouth daily.     . meloxicam (MOBIC) 15 MG tablet Take 15 mg by mouth every other day.     . Omega-3 Fatty Acids (FISH OIL) 1000 MG CAPS Take 1,000 mg by mouth daily. Takes 2 capsules daily.    Marland Kitchen PROTONIX 40 MG tablet Take 1 tablet (40 mg total) by mouth daily before breakfast. 30 tablet 5  . vitamin B-12 (CYANOCOBALAMIN) 1000 MCG tablet Take 1,000 mcg by mouth daily.    Marland Kitchen levocetirizine (XYZAL) 5 MG tablet Take 5 mg by mouth at bedtime.    . polyethylene glycol (MIRALAX / GLYCOLAX) 17 g packet Take 17 g by mouth daily.      Social History   Socioeconomic History  . Marital status: Widowed    Spouse name: Not on file  .  Number of children: 2  . Years of education: 10  . Highest education level: Not on file  Occupational History  . 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 Smoking status: Never Smoker  . Smokeless tobacco: Never Used  Vaping Use  . Vaping Use: Never used  Substance and Sexual Activity  . Alcohol use: No  . Drug use: No  . Sexual activity: Not Currently    Birth control/protection: Post-menopausal  Other Topics Concern  . Not on file  Social History Narrative   Lives alone   Widow   2 grown daughters, 5 grandchildren.   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 Arthritis Mother   . Depression Mother   . Diabetes Mother   . Heart disease Mother   . Hypertension Mother   . Stroke Mother   . Kidney disease Mother 25       dialysis  . Alcohol abuse Father   . Arthritis Father   . Cancer Father 85       brain  . Hypertension Father   . Hypertension Sister   . Arthritis Sister   . Hypertension Brother   . Arthritis Brother   . Hypertension Brother   . Arthritis Brother   . Hypertension Brother   . Hypertension Brother   . Hypertension Brother   . Hypertension Brother   . Other Brother        MVA  . Colon cancer Neg Hx     OBJECTIVE:  Vitals:   01/14/20 1138 01/14/20 1140  BP:  123/78  Pulse:  74  Temp:  98.1 F (36.7 C)  TempSrc:  Oral  SpO2:  99%  Weight: 197 lb (89.4 kg)   Height: 5\' 3"  (1.6 m)      General appearance: alert; appears fatigued, but nontoxic; speaking in full sentences and tolerating own secretions HEENT: NCAT; Ears: EACs clear, TMs pearly gray; Eyes: PERRL.  EOM grossly intact. Sinuses: nontender; Nose: nares patent with clear rhinorrhea, Throat: oropharynx erythematous, cobblestoning present, tonsils non erythematous or enlarged, uvula midline  Neck: supple without LAD Lungs: unlabored respirations, symmetrical air entry; cough: absent; no respiratory distress; CTAB Heart: regular rate and rhythm.  Radial pulses 2+ symmetrical bilaterally Skin: warm and dry Psychological: alert and cooperative; normal mood and affect  LABS:  No results found for this or any previous visit (from the past 24 hour(s)).   ASSESSMENT & PLAN:  1. Viral illness   2. Exposure to COVID-19 virus   3. Sore throat   4. Nasal congestion   5. Post-nasal drip     Continue supportive care  at home COVID and flu testing ordered.  It will take between 2-3 days for test results. Someone will contact you regarding abnormal results.   Patient should remain in quarantine until they have received Covid results.  If negative you may resume normal activities (go back to work/school) while practicing hand hygiene, social distance, and mask wearing.  If positive, patient should remain in quarantine for at least 5 days from symptom onset AND greater than  72 hours after symptoms resolution (absence of fever without the use of fever-reducing medication and improvement in respiratory symptoms), whichever is longer Get plenty of rest and push fluids Use OTC zyrtec for nasal congestion, runny nose, and/or sore throat Use OTC flonase for nasal congestion and runny nose Use medications daily for symptom relief Use OTC medications like ibuprofen or tylenol as needed fever or pain Call or go to the ED if you have any new or worsening symptoms such as fever, worsening cough, shortness of breath, chest tightness, chest pain, turning blue, changes in mental status.  Reviewed expectations re: course of current medical issues. Questions answered. Outlined signs and symptoms indicating need for more acute intervention. Patient verbalized understanding. After Visit Summary given.         Faustino Congress, NP 01/16/20 1040

## 2020-01-14 NOTE — Discharge Instructions (Addendum)
Your COVID test is pending.  You should self quarantine until the test result is back.    Take Tylenol or ibuprofen as needed for fever or discomfort.  Rest and keep yourself hydrated.    Follow-up with your primary care provider if your symptoms are not improving.     

## 2020-01-14 NOTE — ED Triage Notes (Signed)
Pt states that she has been exposed to covid. Pt states that she has a scratchy throat and some ear pain. Pt states that she is vaccinated. x1 week.

## 2020-01-14 NOTE — Progress Notes (Signed)
Subjective:   Patient ID: Alexandra Hoffman, female   DOB: 70 y.o.   MRN: [id number]   HPI 70 year old female presents the office today for concerns of bilateral heel, arch pain which has been all over the last 2 months.  She states the patient in shoes that she wears sometimes the arch arch as well.  She will occasionally some pain in the ball of the foot.  She denies any recent injury or trauma.  She is a blood thinning worse in the right.  She is on meloxicam that was prescribed to orthopedics but she did not take it every day.  No swelling.   Review of Systems  All other systems reviewed and are negative.  Past Medical History:  Diagnosis Date  . Anxiety   . Arthritis    knees  . Constipation   . Depression   . GERD (gastroesophageal reflux disease)   . Hypertension     Past Surgical History:  Procedure Laterality Date  . COLON SURGERY  2006   perforated sigmoid diverticulitis, Dr Arnoldo Morale  . COLONOSCOPY  2006   Dr. Gala Romney: normal rectum and a few sigmoid diverticula, 7 mm angry pedunculated polyp at 30 cm, 4 mm sessile polyp base of cecum. Unknown path   . COLONOSCOPY WITH PROPOFOL N/A 03/04/2017   Procedure: COLONOSCOPY WITH PROPOFOL;  Surgeon: Daneil Dolin, MD;  Location: [facility];  Service: Endoscopy;  Laterality: N/A;  7:30am  . ESOPHAGOGASTRODUODENOSCOPY (EGD) WITH PROPOFOL N/A 03/19/2019   Rourk: Gastric polyps (biopsy showed fundic gland), small hiatal hernia.  Marland Kitchen POLYPECTOMY  03/04/2017   Procedure: POLYPECTOMY;  Surgeon: Daneil Dolin, MD;  Location: [facility];  Service: Endoscopy;;  polyp at cecum and left colon  . POLYPECTOMY  03/19/2019   Procedure: POLYPECTOMY;  Surgeon: Daneil Dolin, MD;  Location: [facility];  Service: Endoscopy;;  gastric     Current Outpatient Medications:  .  diclofenac Sodium (VOLTAREN) 1 % GEL, Apply 2 g topically 4 (four) times daily. Rub into affected area of foot 2 to 4 times daily, Disp: 100 g, Rfl: 2 .  ALPRAZolam (XANAX)  0.5 MG tablet, Take 0.5 mg by mouth at bedtime., Disp: , Rfl:  .  Cholecalciferol (VITAMIN D3 PO), Take by mouth daily., Disp: , Rfl:  .  guaiFENesin (MUCINEX) 600 MG 12 hr tablet, Take 600 mg by mouth daily. , Disp: , Rfl:  .  levocetirizine (XYZAL) 5 MG tablet, Take 5 mg by mouth at bedtime., Disp: , Rfl:  .  linaclotide (LINZESS) 72 MCG capsule, Take 1 capsule (72 mcg total) by mouth daily before breakfast., Disp: 30 capsule, Rfl: 5 .  lisinopril (PRINIVIL,ZESTRIL) 10 MG tablet, Take 10 mg by mouth daily. , Disp: , Rfl:  .  meloxicam (MOBIC) 15 MG tablet, Take 15 mg by mouth every other day. , Disp: , Rfl:  .  Omega-3 Fatty Acids (FISH OIL) 1000 MG CAPS, Take 1,000 mg by mouth daily. Takes 2 capsules daily., Disp: , Rfl:  .  polyethylene glycol (MIRALAX / GLYCOLAX) 17 g packet, Take 17 g by mouth daily. , Disp: , Rfl:  .  PROTONIX 40 MG tablet, Take 1 tablet (40 mg total) by mouth daily before breakfast., Disp: 30 tablet, Rfl: 5 .  vitamin B-12 (CYANOCOBALAMIN) 1000 MCG tablet, Take 1,000 mcg by mouth daily., Disp: , Rfl:   No Known Allergies        Objective:  Physical Exam  General: AAO x3,  NAD  Dermatological: Skin is warm, dry and supple bilateral. There are no open sores, no preulcerative lesions, no rash or signs of infection present.  Vascular: Dorsalis Pedis artery and Posterior Tibial artery pedal pulses are 2/4 bilateral with immedate capillary fill time. There is no pain with calf compression, swelling, warmth, erythema.   Neruologic: Grossly intact via light touch bilateral. Negative tinel sign.   Musculoskeletal: There is minimal tenderness palpation on plantar aspect of calcaneus at the insertion of plantar fascial left side worse than right.  This pain does include arch of the foot today.  No pain in the ball the foot today.  No area of pinpoint tenderness.  Muscular strength 5/5 in all groups tested bilateral.  Gait: Unassisted, Nonantalgic.       Assessment:    Plantar fasciitis     Plan:  -Treatment options discussed including all alternatives, risks, and complications -Etiology of symptoms were discussed -X-rays were obtained and reviewed with the patient.  No evidence of acute fracture or stress fracture identified today. -Discussed stretching, icing daily.  She cannot do oral anti-inflammatories in regular basis.  She can use Voltaren gel as well.  When she modifications, arch supports.  Trula Slade DPM

## 2020-01-16 LAB — COVID-19, FLU A+B NAA
Influenza A, NAA: NOT DETECTED
Influenza B, NAA: NOT DETECTED
SARS-CoV-2, NAA: NOT DETECTED

## 2020-01-20 DIAGNOSIS — J019 Acute sinusitis, unspecified: Secondary | ICD-10-CM | POA: Diagnosis not present

## 2020-02-05 DIAGNOSIS — J0101 Acute recurrent maxillary sinusitis: Secondary | ICD-10-CM | POA: Diagnosis not present

## 2020-02-05 DIAGNOSIS — K5900 Constipation, unspecified: Secondary | ICD-10-CM | POA: Diagnosis not present

## 2020-02-05 DIAGNOSIS — H6121 Impacted cerumen, right ear: Secondary | ICD-10-CM | POA: Diagnosis not present

## 2020-02-05 DIAGNOSIS — K219 Gastro-esophageal reflux disease without esophagitis: Secondary | ICD-10-CM | POA: Diagnosis not present

## 2020-02-05 DIAGNOSIS — M170 Bilateral primary osteoarthritis of knee: Secondary | ICD-10-CM | POA: Diagnosis not present

## 2020-02-05 DIAGNOSIS — H938X1 Other specified disorders of right ear: Secondary | ICD-10-CM | POA: Diagnosis not present

## 2020-02-29 ENCOUNTER — Other Ambulatory Visit: Payer: Self-pay

## 2020-02-29 ENCOUNTER — Encounter: Payer: Self-pay | Admitting: Gastroenterology

## 2020-02-29 ENCOUNTER — Ambulatory Visit: Payer: Medicare Other | Admitting: Gastroenterology

## 2020-02-29 VITALS — BP 158/82 | HR 80 | Temp 97.2°F | Ht 66.0 in | Wt 202.4 lb

## 2020-02-29 DIAGNOSIS — K5900 Constipation, unspecified: Secondary | ICD-10-CM | POA: Diagnosis not present

## 2020-02-29 DIAGNOSIS — Z86010 Personal history of colonic polyps: Secondary | ICD-10-CM | POA: Diagnosis not present

## 2020-02-29 DIAGNOSIS — K219 Gastro-esophageal reflux disease without esophagitis: Secondary | ICD-10-CM | POA: Diagnosis not present

## 2020-02-29 MED ORDER — CLENPIQ 10-3.5-12 MG-GM -GM/160ML PO SOLN: 1.0000 | Freq: Once | ORAL | 0 refills | Status: AC

## 2020-02-29 NOTE — Progress Notes (Signed)
Primary Care Physician: Celene Squibb, MD  Primary Gastroenterologist:  Garfield Cornea, MD   Chief Complaint  Patient presents with  . Constipation    BM daily with taking linzess    HPI: Alexandra Hoffman is a 70 y.o. female here for follow-up.  Last seen in September 2021.History of GERD/dyspepsia/constipation.  EGD March 2021 showed gastric polyps (fundic gland), small hiatal hernia.  Last colonoscopy March 2019, she had a couple of precancerous polyps removed, diverticulosis, prior segmental resection (for perforated diverticulitis).  Advised 3-year surveillance colonoscopy, due in March 2022.      History of chronic constipation, currently on Linzess 72 mcg daily, averages taking about every other day because of expense.  MiraLAX caused bloating.  Did not tolerate fiber supplement.  Could not afford Amitiza. BMs regular if takes Linzess every other day or so. No melena, brbpr. Reflux well controlled on Protonix but does still have to watch what she eats. No dysphagia, vomiting, weight loss, abdominal pain. Weight is up 10 pounds over the past year, 20 pounds in the past 3 years.  Current Outpatient Medications  Medication Sig Dispense Refill  . ALPRAZolam (XANAX) 0.5 MG tablet Take 0.5 mg by mouth at bedtime.    . Cholecalciferol (VITAMIN D3 PO) Take by mouth daily.    Marland Kitchen guaiFENesin (MUCINEX) 600 MG 12 hr tablet Take 600 mg by mouth daily.     Marland Kitchen levocetirizine (XYZAL) 5 MG tablet Take 5 mg by mouth at bedtime.    Marland Kitchen linaclotide (LINZESS) 72 MCG capsule Take 1 capsule (72 mcg total) by mouth daily before breakfast. 30 capsule 5  . lisinopril (PRINIVIL,ZESTRIL) 10 MG tablet Take 10 mg by mouth daily.     . meloxicam (MOBIC) 15 MG tablet Take 15 mg by mouth every other day.     . Omega-3 Fatty Acids (FISH OIL) 1000 MG CAPS Take 1,000 mg by mouth daily. Takes 2 capsules daily.    Marland Kitchen PROTONIX 40 MG tablet Take 1 tablet (40 mg total) by mouth daily before breakfast. 30 tablet 5  . vitamin  B-12 (CYANOCOBALAMIN) 1000 MCG tablet Take 1,000 mcg by mouth daily.     No current facility-administered medications for this visit.    Allergies as of 02/29/2020  . (No Known Allergies)   Past Medical History:  Diagnosis Date  . Anxiety   . Arthritis    knees  . Constipation   . Depression   . GERD (gastroesophageal reflux disease)   . Hypertension    Past Surgical History:  Procedure Laterality Date  . COLON SURGERY  2006   perforated sigmoid diverticulitis, Dr Arnoldo Morale  . COLONOSCOPY  2006   Dr. Gala Romney: normal rectum and a few sigmoid diverticula, 7 mm angry pedunculated polyp at 30 cm, 4 mm sessile polyp base of cecum. Unknown path   . COLONOSCOPY WITH PROPOFOL N/A 03/04/2017   Procedure: COLONOSCOPY WITH PROPOFOL;  Surgeon: Daneil Dolin, MD;  Location: [facility];  Service: Endoscopy;  Laterality: N/A;  7:30am  . ESOPHAGOGASTRODUODENOSCOPY (EGD) WITH PROPOFOL N/A 03/19/2019   Rourk: Gastric polyps (biopsy showed fundic gland), small hiatal hernia.  Marland Kitchen POLYPECTOMY  03/04/2017   Procedure: POLYPECTOMY;  Surgeon: Daneil Dolin, MD;  Location: [facility];  Service: Endoscopy;;  polyp at cecum and left colon  . POLYPECTOMY  03/19/2019   Procedure: POLYPECTOMY;  Surgeon: Daneil Dolin, MD;  Location: [facility];  Service: Endoscopy;;  gastric   Family  History  Problem Relation Age of Onset  . Arthritis Mother   . Depression Mother   . Diabetes Mother   . Heart disease Mother   . Hypertension Mother   . Stroke Mother   . Kidney disease Mother 36       dialysis  . Alcohol abuse Father   . Arthritis Father   . Cancer Father 57       brain  . Hypertension Father   . Hypertension Sister   . Arthritis Sister   . Hypertension Brother   . Arthritis Brother   . Hypertension Brother   . Arthritis Brother   . Hypertension Brother   . Hypertension Brother   . Hypertension Brother   . Hypertension Brother   . Other Brother        MVA  . Colon cancer Neg Hx     Social History   Socioeconomic History  . Marital status: Widowed    Spouse name: Not on file  . Number of children: 2  . Years of education: 10  . Highest education level: Not on file  Occupational History  . 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 Smoking status: Never Smoker  . Smokeless tobacco: Never Used  Vaping Use  . Vaping Use: Never used  Substance and Sexual Activity  . Alcohol use: No  . Drug use: No  . Sexual activity: Not Currently    Birth control/protection: Post-menopausal  Other Topics Concern  . Not on file  Social History Narrative   Lives alone   Widow   2 grown daughters, 5 grandchildren.   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 ENT: Negative for hoarseness, difficulty swallowing , nasal congestion. CV: Negative for chest pain, angina, palpitations, dyspnea on exertion, peripheral edema.  Respiratory: Negative for dyspnea at rest, dyspnea on exertion, cough, sputum, wheezing.  GI: See history of present illness. GU:  Negative for dysuria, hematuria, urinary incontinence, urinary frequency, nocturnal urination.  Endo: Negative for unusual weight change.    Physical Examination:   BP (!) 158/82 (BP Location: Right Arm)   Pulse 80   Temp (!) 97.2 F (36.2 C)   Ht 5\' 6"  (1.676 m)   Wt 202 lb 6.4 oz (91.8 kg)   LMP 01/02/2004 (Approximate)   BMI 32.67 kg/m   General: Well-nourished, well-developed in no acute distress.  Eyes: No icterus. Mouth:masked. Lungs: Clear to auscultation bilaterally.  Heart: Regular rate and rhythm, no murmurs rubs or  gallops.  Abdomen: Bowel sounds are normal, nontender, nondistended, no hepatosplenomegaly or masses, no abdominal bruits or hernia , no rebound or guarding.   Extremities: No lower extremity edema. No clubbing or deformities. Neuro: Alert and oriented x 4   Skin: Warm and dry, no jaundice.   Psych: Alert and cooperative, normal mood and affect.   Assessment/Plan:  Pleasant 70 year old female with history of GERD/dyspepsia, and constipation, history of adenomatous colon polyps presenting for follow-up.  GERD/dyspepsia: Doing much better on Protonix 40 mg daily.  Continues to watch what she eats, follows  antireflux measures.  Weight has crept up over the past couple of years.  Would benefit from 10 pound weight loss.  Constipation: Previously did not tolerate MiraLAX due to bloating.  Cannot afford Amitiza.  Doing well on Linzess 72 mcg daily, averages taking about every other day due to the expense.  States she never meets her deductible and essentially cost medication out-of-pocket.  We will supplement her with samples.  History of adenomatous colon polyps: Due for surveillance colonoscopy at this time.  Had a difficult time with TriLyte late last time due to nausea.  Does not tolerate MiraLAX due to bloating.  We will try to get a different prep for her.  Plan for colonoscopy with Dr. Gala Romney with propofol. ASA II.  I have discussed the risks, alternatives, benefits with regards to but not limited to the risk of reaction to medication, bleeding, infection, perforation and the patient is agreeable to proceed. Written consent to be obtained.

## 2020-02-29 NOTE — Patient Instructions (Addendum)
1. Continue Protonix 40mg  daily before breakfast.  2. Continue Linzess 68mcg daily as needed for constipation. 3. Colonoscopy as scheduled. See separate instructions.

## 2020-03-01 DIAGNOSIS — M170 Bilateral primary osteoarthritis of knee: Secondary | ICD-10-CM | POA: Diagnosis not present

## 2020-03-01 DIAGNOSIS — I10 Essential (primary) hypertension: Secondary | ICD-10-CM | POA: Diagnosis not present

## 2020-04-04 ENCOUNTER — Encounter (HOSPITAL_COMMUNITY): Payer: Self-pay | Admitting: Internal Medicine

## 2020-04-05 ENCOUNTER — Other Ambulatory Visit: Payer: Self-pay

## 2020-04-05 ENCOUNTER — Other Ambulatory Visit (HOSPITAL_COMMUNITY)
Admission: RE | Admit: 2020-04-05 | Discharge: 2020-04-05 | Disposition: A | Discharge status: Home / Self Care | Payer: Medicare Other | Source: Ambulatory Visit | Attending: Internal Medicine | Admitting: Internal Medicine

## 2020-04-05 DIAGNOSIS — Z20822 Contact with and (suspected) exposure to covid-19: Secondary | ICD-10-CM | POA: Insufficient documentation

## 2020-04-05 DIAGNOSIS — Z01812 Encounter for preprocedural laboratory examination: Secondary | ICD-10-CM | POA: Insufficient documentation

## 2020-04-06 ENCOUNTER — Telehealth: Payer: Self-pay | Admitting: Internal Medicine

## 2020-04-06 LAB — SARS CORONAVIRUS 2 (TAT 6-24 HRS): SARS Coronavirus 2: NEGATIVE

## 2020-04-06 NOTE — Telephone Encounter (Signed)
Refill request for Pantoprazole 40 mg daily, [employer].

## 2020-04-06 NOTE — Telephone Encounter (Signed)
Pt said she needed her stomach pills called into [place]. (Protonix generic)

## 2020-04-07 ENCOUNTER — Ambulatory Visit (HOSPITAL_COMMUNITY)
Admission: RE | Admit: 2020-04-07 | Discharge: 2020-04-07 | Disposition: A | Discharge status: Home / Self Care | Payer: Medicare Other | Attending: Internal Medicine | Admitting: Internal Medicine

## 2020-04-07 ENCOUNTER — Encounter (HOSPITAL_COMMUNITY): Payer: Self-pay | Admitting: Internal Medicine

## 2020-04-07 ENCOUNTER — Ambulatory Visit (HOSPITAL_COMMUNITY): Payer: Medicare Other | Admitting: Anesthesiology

## 2020-04-07 ENCOUNTER — Encounter (HOSPITAL_COMMUNITY)
Admission: RE | Disposition: A | Discharge status: Home / Self Care | Payer: Self-pay | Source: Home / Self Care | Attending: Internal Medicine

## 2020-04-07 ENCOUNTER — Other Ambulatory Visit: Payer: Self-pay

## 2020-04-07 DIAGNOSIS — Z79899 Other long term (current) drug therapy: Secondary | ICD-10-CM | POA: Insufficient documentation

## 2020-04-07 DIAGNOSIS — Z791 Long term (current) use of non-steroidal anti-inflammatories (NSAID): Secondary | ICD-10-CM | POA: Insufficient documentation

## 2020-04-07 DIAGNOSIS — Z980 Intestinal bypass and anastomosis status: Secondary | ICD-10-CM | POA: Insufficient documentation

## 2020-04-07 DIAGNOSIS — K5900 Constipation, unspecified: Secondary | ICD-10-CM | POA: Diagnosis not present

## 2020-04-07 DIAGNOSIS — Z86010 Personal history of colonic polyps: Secondary | ICD-10-CM

## 2020-04-07 DIAGNOSIS — K640 First degree hemorrhoids: Secondary | ICD-10-CM | POA: Insufficient documentation

## 2020-04-07 DIAGNOSIS — K635 Polyp of colon: Secondary | ICD-10-CM | POA: Diagnosis not present

## 2020-04-07 DIAGNOSIS — K5730 Diverticulosis of large intestine without perforation or abscess without bleeding: Secondary | ICD-10-CM | POA: Diagnosis not present

## 2020-04-07 DIAGNOSIS — Z1211 Encounter for screening for malignant neoplasm of colon: Secondary | ICD-10-CM | POA: Diagnosis not present

## 2020-04-07 DIAGNOSIS — Z09 Encounter for follow-up examination after completed treatment for conditions other than malignant neoplasm: Secondary | ICD-10-CM | POA: Diagnosis present

## 2020-04-07 DIAGNOSIS — D120 Benign neoplasm of cecum: Secondary | ICD-10-CM | POA: Insufficient documentation

## 2020-04-07 HISTORY — PX: POLYPECTOMY: SHX5525

## 2020-04-07 HISTORY — PX: COLONOSCOPY WITH PROPOFOL: SHX5780

## 2020-04-07 SURGERY — COLONOSCOPY WITH PROPOFOL
Anesthesia: General

## 2020-04-07 MED ORDER — LIDOCAINE HCL (PF) 2 % IJ SOLN
INTRAMUSCULAR | Status: AC
  Filled 2020-04-07: qty 5

## 2020-04-07 MED ORDER — LIDOCAINE HCL (CARDIAC) PF 100 MG/5ML IV SOSY
PREFILLED_SYRINGE | INTRAVENOUS | Status: DC | PRN
  Administered 2020-04-07: 50 mg via INTRATRACHEAL

## 2020-04-07 MED ORDER — EPHEDRINE SULFATE-NACL 50-0.9 MG/10ML-% IV SOSY
PREFILLED_SYRINGE | INTRAVENOUS | Status: DC | PRN
  Administered 2020-04-07: 5 mg via INTRAVENOUS

## 2020-04-07 MED ORDER — PHENYLEPHRINE 40 MCG/ML (10ML) SYRINGE FOR IV PUSH (FOR BLOOD PRESSURE SUPPORT)
PREFILLED_SYRINGE | INTRAVENOUS | Status: AC
  Filled 2020-04-07: qty 10

## 2020-04-07 MED ORDER — STERILE WATER FOR IRRIGATION IR SOLN
Status: DC | PRN
  Administered 2020-04-07: 1.5 mL

## 2020-04-07 MED ORDER — GLYCOPYRROLATE 0.2 MG/ML IJ SOLN
INTRAMUSCULAR | Status: DC | PRN
  Administered 2020-04-07: .1 mg via INTRAVENOUS

## 2020-04-07 MED ORDER — ROCURONIUM BROMIDE 10 MG/ML (PF) SYRINGE
PREFILLED_SYRINGE | INTRAVENOUS | Status: AC
  Filled 2020-04-07: qty 20

## 2020-04-07 MED ORDER — EPHEDRINE 5 MG/ML INJ
INTRAVENOUS | Status: AC
  Filled 2020-04-07: qty 40

## 2020-04-07 MED ORDER — PHENYLEPHRINE 40 MCG/ML (10ML) SYRINGE FOR IV PUSH (FOR BLOOD PRESSURE SUPPORT)
PREFILLED_SYRINGE | INTRAVENOUS | Status: AC
  Filled 2020-04-07: qty 40

## 2020-04-07 MED ORDER — EPHEDRINE 5 MG/ML INJ
INTRAVENOUS | Status: AC
  Filled 2020-04-07: qty 20

## 2020-04-07 MED ORDER — LACTATED RINGERS IV SOLN: INTRAVENOUS | Status: DC

## 2020-04-07 MED ORDER — PROPOFOL 500 MG/50ML IV EMUL
INTRAVENOUS | Status: DC | PRN
  Administered 2020-04-07: 150 ug/kg/min via INTRAVENOUS

## 2020-04-07 MED ORDER — PROPOFOL 10 MG/ML IV BOLUS
INTRAVENOUS | Status: DC | PRN
  Administered 2020-04-07: 50 mg via INTRAVENOUS

## 2020-04-07 MED ORDER — KETAMINE HCL 50 MG/5ML IJ SOSY
PREFILLED_SYRINGE | INTRAMUSCULAR | Status: AC
  Filled 2020-04-07: qty 5

## 2020-04-07 MED ORDER — KETAMINE HCL 10 MG/ML IJ SOLN
INTRAMUSCULAR | Status: DC | PRN
  Administered 2020-04-07: 10 mg via INTRAVENOUS

## 2020-04-07 NOTE — Anesthesia Preprocedure Evaluation (Signed)
Anesthesia Evaluation  Patient identified by MRN, date of birth, ID band Patient awake    Reviewed: Allergy & Precautions, H&P , NPO status , Patient's Chart, lab work & pertinent test results, reviewed documented beta blocker date and time   Airway Mallampati: II  TM Distance: >3 FB Neck ROM: full    Dental no notable dental hx.    Pulmonary neg pulmonary ROS,    Pulmonary exam normal breath sounds clear to auscultation       Cardiovascular Exercise Tolerance: Good hypertension, negative cardio ROS   Rhythm:regular Rate:Normal     Neuro/Psych PSYCHIATRIC DISORDERS Anxiety Depression negative neurological ROS     GI/Hepatic Neg liver ROS, GERD  Medicated,  Endo/Other  negative endocrine ROS  Renal/GU negative Renal ROS  negative genitourinary   Musculoskeletal   Abdominal   Peds  Hematology negative hematology ROS (+)   Anesthesia Other Findings   Reproductive/Obstetrics negative OB ROS                             Anesthesia Physical Anesthesia Plan  ASA: II  Anesthesia Plan: General   Post-op Pain Management:    Induction:   PONV Risk Score and Plan: Propofol infusion  Airway Management Planned:   Additional Equipment:   Intra-op Plan:   Post-operative Plan:   Informed Consent: I have reviewed the patients History and Physical, chart, labs and discussed the procedure including the risks, benefits and alternatives for the proposed anesthesia with the patient or authorized representative who has indicated his/her understanding and acceptance.     Dental Advisory Given  Plan Discussed with: CRNA  Anesthesia Plan Comments:         Anesthesia Quick Evaluation

## 2020-04-07 NOTE — Discharge Instructions (Signed)
Colonoscopy Discharge Instructions  Read the instructions outlined below and refer to this sheet in the next few weeks. These discharge instructions provide you with general information on caring for yourself after you leave the hospital. Your doctor may also give you specific instructions. While your treatment has been planned according to the most current medical practices available, unavoidable complications occasionally occur. If you have any problems or questions after discharge, call Dr. Gala Romney at [phone]. ACTIVITY  You may resume your regular activity, but move at a slower pace for the next 24 hours.   Take frequent rest periods for the next 24 hours.   Walking will help get rid of the air and reduce the bloated feeling in your belly (abdomen).   No driving for 24 hours (because of the medicine (anesthesia) used during the test).    Do not sign any important legal documents or operate any machinery for 24 hours (because of the anesthesia used during the test).  NUTRITION  Drink plenty of fluids.   You may resume your normal diet as instructed by your doctor.   Begin with a light meal and progress to your normal diet. Heavy or fried foods are harder to digest and may make you feel sick to your stomach (nauseated).   Avoid alcoholic beverages for 24 hours or as instructed.  MEDICATIONS  You may resume your normal medications unless your doctor tells you otherwise.  WHAT YOU CAN EXPECT TODAY  Some feelings of bloating in the abdomen.   Passage of more gas than usual.   Spotting of blood in your stool or on the toilet paper.  IF YOU HAD POLYPS REMOVED DURING THE COLONOSCOPY:  No aspirin products for 7 days or as instructed.   No alcohol for 7 days or as instructed.   Eat a soft diet for the next 24 hours.  FINDING OUT THE RESULTS OF YOUR TEST Not all test results are available during your visit. If your test results are not back during the visit, make an appointment  with your caregiver to find out the results. Do not assume everything is normal if you have not heard from your caregiver or the medical facility. It is important for you to follow up on all of your test results.  SEEK IMMEDIATE MEDICAL ATTENTION IF:  You have more than a spotting of blood in your stool.   Your belly is swollen (abdominal distention).   You are nauseated or vomiting.   You have a temperature over 101.   You have abdominal pain or discomfort that is severe or gets worse throughout the day.    2 polyps removed today  Polyp and diverticulosis information provided  Further recommendations to follow pending review of pathology report  Patient request, called Alexandra Hoffman at [phone] and provided results.   Colon Polyps  Colon polyps are tissue growths inside the colon, which is part of the large intestine. They are one of the types of polyps that can grow in the body. A polyp may be a round bump or a mushroom-shaped growth. You could have one polyp or more than one. Most colon polyps are noncancerous (benign). However, some colon polyps can become cancerous over time. Finding and removing the polyps early can help prevent this. What are the causes? The exact cause of colon polyps is not known. What increases the risk? The following factors may make you more likely to develop this condition:  Having a family history of colorectal cancer or colon polyps.  Being older than 68 years of age.  Being younger than 69 years of age and having a significant family history of colorectal cancer or colon polyps or a genetic condition that puts you at higher risk of getting colon polyps.  Having inflammatory bowel disease, such as ulcerative colitis or Crohn's disease.  Having certain conditions passed from parent to child (hereditary conditions), such as: ? Familial adenomatous polyposis (FAP). ? Lynch syndrome. ? Turcot syndrome. ? Peutz-Jeghers  syndrome. ? MUTYH-associated polyposis (MAP).  Being overweight.  Certain lifestyle factors. These include smoking cigarettes, drinking too much alcohol, not getting enough exercise, and eating a diet that is high in fat and red meat and low in fiber.  Having had childhood cancer that was treated with radiation of the abdomen. What are the signs or symptoms? Many times, there are no symptoms. If you have symptoms, they may include:  Blood coming from the rectum during a bowel movement.  Blood in the stool (feces). The blood may be bright red or very dark in color.  Pain in the abdomen.  A change in bowel habits, such as constipation or diarrhea. How is this diagnosed? This condition is diagnosed with a colonoscopy. This is a procedure in which a lighted, flexible scope is inserted into the opening between the buttocks (anus) and then passed into the colon to examine the area. Polyps are sometimes found when a colonoscopy is done as part of routine cancer screening tests. How is this treated? This condition is treated by removing any polyps that are found. Most polyps can be removed during a colonoscopy. Those polyps will then be tested for cancer. Additional treatment may be needed depending on the results of testing. Follow these instructions at home: Eating and drinking  Eat foods that are high in fiber, such as fruits, vegetables, and whole grains.  Eat foods that are high in calcium and vitamin D, such as milk, cheese, yogurt, eggs, liver, fish, and broccoli.  Limit foods that are high in fat, such as fried foods and desserts.  Limit the amount of red meat, precooked or cured meat, or other processed meat that you eat, such as hot dogs, sausages, bacon, or meat loaves.  Limit sugary drinks.   Lifestyle  Maintain a healthy weight, or lose weight if recommended by your health care provider.  Exercise every day or as told by your health care provider.  Do not use any  products that contain nicotine or tobacco, such as cigarettes, e-cigarettes, and chewing tobacco. If you need help quitting, ask your health care provider.  Do not drink alcohol if: ? Your health care provider tells you not to drink. ? You are pregnant, may be pregnant, or are planning to become pregnant.  If you drink alcohol: ? Limit how much you use to:  0-1 drink a day for women.  0-2 drinks a day for men. ? Know how much alcohol is in your drink. In the [place] one drink equals one 12 oz bottle of beer (355 mL), one 5 oz glass of wine (148 mL), or one 1 oz glass of hard liquor (44 mL). General instructions  Take over-the-counter and prescription medicines only as told by your health care provider.  Keep all follow-up visits. This is important. This includes having regularly scheduled colonoscopies. Talk to your health care provider about when you need a colonoscopy. Contact a health care provider if:  You have new or worsening bleeding during a bowel movement.  You have new  or increased blood in your stool.  You have a change in bowel habits.  You lose weight for no known reason. Summary  Colon polyps are tissue growths inside the colon, which is part of the large intestine. They are one type of polyp that can grow in the body.  Most colon polyps are noncancerous (benign), but some can become cancerous over time.  This condition is diagnosed with a colonoscopy.  This condition is treated by removing any polyps that are found. Most polyps can be removed during a colonoscopy. This information is not intended to replace advice given to you by your health care provider. Make sure you discuss any questions you have with your health care provider. Document Revised: 04/08/2019 Document Reviewed: 04/08/2019 Elsevier Patient Education  2021 [place].   Diverticulosis  Diverticulosis is a condition that develops when small pouches (diverticula) form in the wall of the large  intestine (colon). The colon is where water is absorbed and stool (feces) is formed. The pouches form when the inside layer of the colon pushes through weak spots in the outer layers of the colon. You may have a few pouches or many of them. The pouches usually do not cause problems unless they become inflamed or infected. When this happens, the condition is called diverticulitis. What are the causes? The cause of this condition is not known. What increases the risk? The following factors may make you more likely to develop this condition:  Being older than age 21. Your risk for this condition increases with age. Diverticulosis is rare among people younger than age 21. By age 4, many people have it.  Eating a low-fiber diet.  Having frequent constipation.  Being overweight.  Not getting enough exercise.  Smoking.  Taking over-the-counter pain medicines, like aspirin and ibuprofen.  Having a family history of diverticulosis. What are the signs or symptoms? In most people, there are no symptoms of this condition. If you do have symptoms, they may include:  Bloating.  Cramps in the abdomen.  Constipation or diarrhea.  Pain in the lower left side of the abdomen. How is this diagnosed? Because diverticulosis usually has no symptoms, it is most often diagnosed during an exam for other colon problems. The condition may be diagnosed by:  Using a flexible scope to examine the colon (colonoscopy).  Taking an X-ray of the colon after dye has been put into the colon (barium enema).  Having a CT scan. How is this treated? You may not need treatment for this condition. Your health care provider may recommend treatment to prevent problems. You may need treatment if you have symptoms or if you previously had diverticulitis. Treatment may include:  Eating a high-fiber diet.  Taking a fiber supplement.  Taking a live bacteria supplement (probiotic).  Taking medicine to relax your  colon.   Follow these instructions at home: Medicines  Take over-the-counter and prescription medicines only as told by your health care provider.  If told by your health care provider, take a fiber supplement or probiotic. Constipation prevention Your condition may cause constipation. To prevent or treat constipation, you may need to:  Drink enough fluid to keep your urine pale yellow.  Take over-the-counter or prescription medicines.  Eat foods that are high in fiber, such as beans, whole grains, and fresh fruits and vegetables.  Limit foods that are high in fat and processed sugars, such as fried or sweet foods.   General instructions  Try not to strain when you have  a bowel movement.  Keep all follow-up visits as told by your health care provider. This is important. Contact a health care provider if you:  Have pain in your abdomen.  Have bloating.  Have cramps.  Have not had a bowel movement in 3 days. Get help right away if:  Your pain gets worse.  Your bloating becomes very bad.  You have a fever or chills, and your symptoms suddenly get worse.  You vomit.  You have bowel movements that are bloody or black.  You have bleeding from your rectum. Summary  Diverticulosis is a condition that develops when small pouches (diverticula) form in the wall of the large intestine (colon).  You may have a few pouches or many of them.  This condition is most often diagnosed during an exam for other colon problems.  Treatment may include increasing the fiber in your diet, taking supplements, or taking medicines. This information is not intended to replace advice given to you by your health care provider. Make sure you discuss any questions you have with your health care provider. Document Revised: 07/17/2018 Document Reviewed: 07/17/2018 Elsevier Patient Education  [place].

## 2020-04-07 NOTE — Op Note (Signed)
[facility] Patient Name: Alexandra Hoffman Procedure Date: 04/07/2020 8:38 AM MRN: [id number] Date of Birth: August 07, 1950 Attending MD: Norvel Richards , MD CSN: [id number] Age: 70 Admit Type: Outpatient Procedure:                Colonoscopy Indications:              High risk colon cancer surveillance: Personal                            history of colonic polyps Providers:                Norvel Richards, MD, Raphael Gibney, Technician Referring MD:              Medicines:                Propofol per Anesthesia Complications:            No immediate complications. Estimated Blood Loss:     Estimated blood loss was minimal. Procedure:                Pre-Anesthesia Assessment:                           - Prior to the procedure, a History and Physical                            was performed, and patient medications and                            allergies were reviewed. The patient's tolerance of                            previous anesthesia was also reviewed. The risks                            and benefits of the procedure and the sedation                            options and risks were discussed with the patient.                            All questions were answered, and informed consent                            was obtained. Prior Anticoagulants: The patient has                            taken no previous anticoagulant or antiplatelet                            agents. ASA Grade Assessment: III - A patient with                            severe systemic disease. After reviewing the risks  and benefits, the patient was deemed in                            satisfactory condition to undergo the procedure.                           After obtaining informed consent, the colonoscope                            was passed under direct vision. Throughout the                            procedure, the patient's blood pressure, pulse, and                             oxygen saturations were monitored continuously. The                            CF-HQ190L ([id number]) scope was introduced through                            the anus and advanced to the the cecum, identified                            by appendiceal orifice and ileocecal valve. The                            colonoscopy was performed without difficulty. The                            patient tolerated the procedure well. The quality                            of the bowel preparation was adequate. The                            ileocecal valve, appendiceal orifice, and rectum                            were photographed. Scope In: 8:54:24 AM Scope Out: 9:14:51 AM Scope Withdrawal Time: 0 hours 16 minutes 55 seconds  Total Procedure Duration: 0 hours 20 minutes 27 seconds  Findings:      The perianal and digital rectal examinations were normal.      Two sessile polyps were found in the cecum. The polyps were 7 to 9 mm in       size. These polyps were removed with a cold snare. Resection and       retrieval were complete. Estimated blood loss was minimal.      Non-bleeding internal hemorrhoids were found during retroflexion. The       hemorrhoids were moderate, medium-sized and Grade I (internal       hemorrhoids that do not prolapse). Distal colonic diverticulosis.       Surgical anastomosis identified 20 cm from the anal verge. Otherwise,       the remainder the  colonic mucosa including retroflexion view the anal       verge demonstrated no abnormalities. Impression:               - Two 7 to 9 mm polyps in the cecum, removed with a                            cold snare. Resected and retrieved.                           - Non-bleeding internal hemorrhoids. Colonic                            diverticulosis. Status post segmental resection as                            described. Moderate Sedation:      Moderate (conscious) sedation was personally administered by an        anesthesia professional. The following parameters were monitored: oxygen       saturation, heart rate, blood pressure, respiratory rate, EKG, adequacy       of pulmonary ventilation, and response to care. Recommendation:           - Patient has a contact number available for                            emergencies. The signs and symptoms of potential                            delayed complications were discussed with the                            patient. Return to normal activities tomorrow.                            Written discharge instructions were provided to the                            patient.                           - Advance diet as tolerated.                           - Continue present medications.                           - Repeat colonoscopy date to be determined after                            pending pathology results are reviewed for                            surveillance based on pathology results.                           - Return to GI clinic (date  not yet determined). Procedure Code(s):        --- Professional ---                           [phone], Colonoscopy, flexible; with removal of                            tumor(s), polyp(s), or other lesion(s) by snare                            technique Diagnosis Code(s):        --- Professional ---                           Z86.010, Personal history of colonic polyps                           K63.5, Polyp of colon                           K64.0, First degree hemorrhoids CPT copyright 2019 [employer] Association. All rights reserved. The codes documented in this report are preliminary and upon coder review may  be revised to meet current compliance requirements. [name]. [name], MD Norvel Richards, MD 04/07/2020 9:21:21 AM This report has been signed electronically. Number of Addenda: 0

## 2020-04-07 NOTE — H&P (Signed)
@LOGO @   Primary Care Physician:  Pablo Lawrence, NP Primary Gastroenterologist:  Dr. Gala Romney  Pre-Procedure History & Physical: HPI:  Alexandra Hoffman is a 70 y.o. female here for surveillance colonoscopy.  History of numerous colonic adenomas removed over time.  Constipation well managed with Linzess.  Past Medical History:  Diagnosis Date  . Anxiety   . Arthritis    knees  . Constipation   . Depression   . GERD (gastroesophageal reflux disease)   . Hypertension     Past Surgical History:  Procedure Laterality Date  . COLON SURGERY  2006   perforated sigmoid diverticulitis, Dr Arnoldo Morale  . COLONOSCOPY  2006   Dr. Gala Romney: normal rectum and a few sigmoid diverticula, 7 mm angry pedunculated polyp at 30 cm, 4 mm sessile polyp base of cecum. Unknown path   . COLONOSCOPY WITH PROPOFOL N/A 03/04/2017   Procedure: COLONOSCOPY WITH PROPOFOL;  Surgeon: Daneil Dolin, MD;  [facility];  Service: Endoscopy;  Laterality: N/A;  7:30am  . ESOPHAGOGASTRODUODENOSCOPY (EGD) WITH PROPOFOL N/A 03/19/2019   Edilson Vital: Gastric polyps (biopsy showed fundic gland), small hiatal hernia.  Marland Kitchen POLYPECTOMY  03/04/2017   Procedure: POLYPECTOMY;  Surgeon: Daneil Dolin, MD;  [facility]: [facility];  Service: Endoscopy;;  polyp at cecum and left colon  . POLYPECTOMY  03/19/2019   Procedure: POLYPECTOMY;  Surgeon: Daneil Dolin, MD;  Location: [facility];  Service: Endoscopy;;  gastric    Prior to Admission medications   Medication Sig Start Date End Date Taking? Authorizing Provider  ALPRAZolam Duanne Moron) 0.5 MG tablet Take 0.5 mg by mouth at bedtime.   Yes [provider]  guaiFENesin (MUCINEX) 600 MG 12 hr tablet Take 600 mg by mouth in the morning.   Yes [provider]  levocetirizine (XYZAL) 5 MG tablet Take 5 mg by mouth at bedtime. 01/08/20  Yes [provider]  linaclotide (LINZESS) 72 MCG capsule Take 1 capsule (72 mcg total) by mouth daily before breakfast. Patient  taking differently: Take 72 mcg by mouth daily as needed (constipation). 09/28/19  Yes Mahala Menghini, PA-C  lisinopril (PRINIVIL,ZESTRIL) 10 MG tablet Take 10 mg by mouth in the morning.   Yes [provider]  meloxicam (MOBIC) 15 MG tablet Take 15 mg by mouth 2 (two) times a week.   Yes [provider]  Omega-3 Fatty Acids (FISH OIL) 1200 MG CAPS Take 1,200 mg by mouth in the morning.   Yes [provider]  PROTONIX 40 MG tablet Take 1 tablet (40 mg total) by mouth daily before breakfast. 09/28/19  Yes Mahala Menghini, PA-C  vitamin B-12 (CYANOCOBALAMIN) 1000 MCG tablet Take 1,000 mcg by mouth in the morning.   Yes [provider]    Allergies as of 02/29/2020  . (No Known Allergies)    Family History  Problem Relation Age of Onset  . Arthritis Mother   . Depression Mother   . Diabetes Mother   . Heart disease Mother   . Hypertension Mother   . Stroke Mother   . Kidney disease Mother 53       dialysis  . Alcohol abuse Father   . Arthritis Father   . Cancer Father 61       brain  . Hypertension Father   . Hypertension Sister   . Arthritis Sister   . Hypertension Brother   . Arthritis Brother   . Hypertension Brother   . Arthritis Brother   .  Hypertension Brother   . Hypertension Brother   . Hypertension Brother   . Hypertension Brother   . Other Brother        MVA  . Colon cancer Neg Hx     Social History   Socioeconomic History  . Marital status: Widowed    Spouse name: Not on file  . Number of children: 2  . Years of education: 10  . Highest education level: Not on file  Occupational History  . 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 Smoking status: Never Smoker  . Smokeless tobacco: Never Used  Vaping Use  . Vaping Use: Never used  Substance and Sexual Activity  . Alcohol use: No  . Drug use: No  . Sexual activity: Not Currently    Birth control/protection:  Post-menopausal  Other Topics Concern  . Not on file  Social History Narrative   Lives alone   Widow   2 grown daughters, 5 grandchildren.   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   Social Determinants of Health   Financial Resource Strain: Not on file  Food Insecurity: Not on file  Transportation Needs: Not on file  Physical Activity: Not on file  Stress: Not on file  Social Connections: Not on file  Intimate Partner Violence: Not on file    Review of Systems: See HPI, otherwise negative ROS  Physical Exam: BP (!) 142/70   Pulse 75   Temp 98 F (36.7 C) (Oral)   Resp 15   Ht 5\' 4"  (1.626 m)   Wt 88.9 kg   LMP 01/02/2004 (Approximate)   SpO2 98%   BMI 33.64 kg/m  General:   Alert,  Well-developed, well-nourished, pleasant and cooperative in NAD Neck:  Supple; no masses or thyromegaly. No significant cervical adenopathy. Lungs:  Clear throughout to auscultation.   No wheezes, crackles, or rhonchi. No acute distress. Heart:  Regular rate and rhythm; no murmurs, clicks, rubs,  or gallops. Abdomen: Non-distended, normal bowel sounds.  Soft and nontender without appreciable mass or hepatosplenomegaly.  Pulses:  Normal pulses noted. Extremities:  Without clubbing or edema.  Impression/Plan: 70 year old lady with history of multiple colonic adenomas removed over time here for surveillance colonoscopy per plan. The risks, benefits, limitations, alternatives and imponderables have been reviewed with the patient. Questions have been answered. All parties are agreeable.      Notice: This dictation was prepared with Dragon dictation along with smaller phrase technology. Any transcriptional errors that result from this process are unintentional and may not be corrected upon review.

## 2020-04-07 NOTE — Anesthesia Postprocedure Evaluation (Signed)
Anesthesia Post Note  Patient: Alexandra Hoffman  Procedure(s) Performed: COLONOSCOPY WITH PROPOFOL (N/A ) POLYPECTOMY  Patient location during evaluation: Phase II Anesthesia Type: General Level of consciousness: awake and alert Pain management: pain level controlled Vital Signs Assessment: post-procedure vital signs reviewed and stable Respiratory status: spontaneous breathing Cardiovascular status: stable Postop Assessment: no apparent nausea or vomiting Anesthetic complications: no   No complications documented.   Last Vitals:  Vitals:   04/07/20 0727  BP: (!) 142/70  Pulse: 75  Resp: 15  Temp: 36.7 C  SpO2: 98%    Last Pain:  Vitals:   04/07/20 0848  TempSrc:   PainSc: 0-No pain                 Auriel Kist Hristova

## 2020-04-07 NOTE — Transfer of Care (Signed)
Immediate Anesthesia Transfer of Care Note  Patient: Alexandra Hoffman  Procedure(s) Performed: COLONOSCOPY WITH PROPOFOL (N/A ) POLYPECTOMY  Patient Location: PACU  Anesthesia Type:General  Level of Consciousness: awake and alert   Airway & Oxygen Therapy: Patient Spontanous Breathing  Post-op Assessment: Report given to RN and Post -op Vital signs reviewed and stable  Post vital signs: Reviewed and stable  Last Vitals:  Vitals Value Taken Time  BP    Temp    Pulse    Resp    SpO2      Last Pain:  Vitals:   04/07/20 0848  TempSrc:   PainSc: 0-No pain      Patients Stated Pain Goal: 4 (96/78/93 8101)  Complications: No complications documented.

## 2020-04-08 ENCOUNTER — Encounter: Payer: Self-pay | Admitting: Internal Medicine

## 2020-04-08 LAB — SURGICAL PATHOLOGY

## 2020-04-08 MED ORDER — PROTONIX 40 MG PO TBEC: 40.0000 mg | DELAYED_RELEASE_TABLET | Freq: Every day | ORAL | 5 refills | Status: DC

## 2020-04-08 NOTE — Telephone Encounter (Signed)
Completed.

## 2020-04-08 NOTE — Addendum Note (Signed)
Addended by: Annitta Needs on: 04/08/2020 03:29 PM   Modules accepted: Orders

## 2020-04-13 ENCOUNTER — Encounter (HOSPITAL_COMMUNITY): Payer: Self-pay | Admitting: Internal Medicine

## 2020-04-25 ENCOUNTER — Telehealth: Payer: Self-pay | Admitting: Internal Medicine

## 2020-04-25 NOTE — Telephone Encounter (Signed)
Patient would like to try linzess samples.  Please call her when we have them ready

## 2020-04-25 NOTE — Telephone Encounter (Signed)
Spoke with pt. Linzess 72 mcg samples are ready for pick up.  

## 2020-04-28 DIAGNOSIS — Z20822 Contact with and (suspected) exposure to covid-19: Secondary | ICD-10-CM | POA: Diagnosis not present

## 2020-05-31 DIAGNOSIS — I10 Essential (primary) hypertension: Secondary | ICD-10-CM | POA: Diagnosis not present

## 2020-05-31 DIAGNOSIS — K219 Gastro-esophageal reflux disease without esophagitis: Secondary | ICD-10-CM | POA: Diagnosis not present

## 2020-05-31 DIAGNOSIS — R7303 Prediabetes: Secondary | ICD-10-CM | POA: Diagnosis not present

## 2020-05-31 DIAGNOSIS — Z0000 Encounter for general adult medical examination without abnormal findings: Secondary | ICD-10-CM | POA: Diagnosis not present

## 2020-05-31 DIAGNOSIS — E782 Mixed hyperlipidemia: Secondary | ICD-10-CM | POA: Diagnosis not present

## 2020-06-12 DIAGNOSIS — E785 Hyperlipidemia, unspecified: Secondary | ICD-10-CM

## 2020-06-12 DIAGNOSIS — E559 Vitamin D deficiency, unspecified: Secondary | ICD-10-CM | POA: Insufficient documentation

## 2020-06-12 HISTORY — DX: Hyperlipidemia, unspecified: E78.5

## 2020-08-08 DIAGNOSIS — M79671 Pain in right foot: Secondary | ICD-10-CM | POA: Diagnosis not present

## 2020-08-08 DIAGNOSIS — L600 Ingrowing nail: Secondary | ICD-10-CM | POA: Diagnosis not present

## 2020-08-08 DIAGNOSIS — M79674 Pain in right toe(s): Secondary | ICD-10-CM | POA: Diagnosis not present

## 2020-08-08 DIAGNOSIS — L03031 Cellulitis of right toe: Secondary | ICD-10-CM | POA: Diagnosis not present

## 2020-08-09 DIAGNOSIS — M545 Low back pain, unspecified: Secondary | ICD-10-CM | POA: Diagnosis not present

## 2020-08-09 DIAGNOSIS — I10 Essential (primary) hypertension: Secondary | ICD-10-CM | POA: Diagnosis not present

## 2020-08-09 DIAGNOSIS — G8929 Other chronic pain: Secondary | ICD-10-CM | POA: Diagnosis not present

## 2020-08-09 DIAGNOSIS — R42 Dizziness and giddiness: Secondary | ICD-10-CM | POA: Diagnosis not present

## 2020-08-09 DIAGNOSIS — Z23 Encounter for immunization: Secondary | ICD-10-CM | POA: Diagnosis not present

## 2020-08-29 DIAGNOSIS — M79671 Pain in right foot: Secondary | ICD-10-CM | POA: Diagnosis not present

## 2020-08-29 DIAGNOSIS — L600 Ingrowing nail: Secondary | ICD-10-CM | POA: Diagnosis not present

## 2020-08-29 DIAGNOSIS — M79674 Pain in right toe(s): Secondary | ICD-10-CM | POA: Diagnosis not present

## 2020-08-29 DIAGNOSIS — L03031 Cellulitis of right toe: Secondary | ICD-10-CM | POA: Diagnosis not present

## 2020-09-05 ENCOUNTER — Other Ambulatory Visit: Payer: Self-pay | Admitting: Gastroenterology

## 2020-09-12 DIAGNOSIS — M79674 Pain in right toe(s): Secondary | ICD-10-CM | POA: Diagnosis not present

## 2020-09-12 DIAGNOSIS — L600 Ingrowing nail: Secondary | ICD-10-CM | POA: Diagnosis not present

## 2020-09-12 DIAGNOSIS — L03031 Cellulitis of right toe: Secondary | ICD-10-CM | POA: Diagnosis not present

## 2020-09-12 DIAGNOSIS — M79671 Pain in right foot: Secondary | ICD-10-CM | POA: Diagnosis not present

## 2020-09-26 DIAGNOSIS — M79674 Pain in right toe(s): Secondary | ICD-10-CM | POA: Diagnosis not present

## 2020-09-26 DIAGNOSIS — L600 Ingrowing nail: Secondary | ICD-10-CM | POA: Diagnosis not present

## 2020-09-26 DIAGNOSIS — M79671 Pain in right foot: Secondary | ICD-10-CM | POA: Diagnosis not present

## 2020-09-26 DIAGNOSIS — L03031 Cellulitis of right toe: Secondary | ICD-10-CM | POA: Diagnosis not present

## 2020-10-03 ENCOUNTER — Other Ambulatory Visit: Payer: Self-pay | Admitting: Gastroenterology

## 2020-10-11 DIAGNOSIS — Z23 Encounter for immunization: Secondary | ICD-10-CM | POA: Diagnosis not present

## 2020-10-11 DIAGNOSIS — Z1231 Encounter for screening mammogram for malignant neoplasm of breast: Secondary | ICD-10-CM | POA: Diagnosis not present

## 2020-10-11 DIAGNOSIS — L729 Follicular cyst of the skin and subcutaneous tissue, unspecified: Secondary | ICD-10-CM | POA: Diagnosis not present

## 2020-10-11 DIAGNOSIS — I10 Essential (primary) hypertension: Secondary | ICD-10-CM | POA: Diagnosis not present

## 2020-10-24 DIAGNOSIS — L03031 Cellulitis of right toe: Secondary | ICD-10-CM | POA: Diagnosis not present

## 2020-10-24 DIAGNOSIS — M79674 Pain in right toe(s): Secondary | ICD-10-CM | POA: Diagnosis not present

## 2020-10-24 DIAGNOSIS — L600 Ingrowing nail: Secondary | ICD-10-CM | POA: Diagnosis not present

## 2020-10-24 DIAGNOSIS — M79671 Pain in right foot: Secondary | ICD-10-CM | POA: Diagnosis not present

## 2020-11-07 DIAGNOSIS — M1711 Unilateral primary osteoarthritis, right knee: Secondary | ICD-10-CM | POA: Diagnosis not present

## 2020-11-10 ENCOUNTER — Other Ambulatory Visit: Payer: Self-pay

## 2020-11-10 ENCOUNTER — Encounter: Payer: Self-pay | Admitting: General Surgery

## 2020-11-10 ENCOUNTER — Ambulatory Visit: Payer: Medicare Other | Admitting: General Surgery

## 2020-11-10 VITALS — BP 125/80 | HR 78 | Temp 98.4°F | Resp 14 | Ht 64.0 in | Wt 192.0 lb

## 2020-11-10 DIAGNOSIS — L720 Epidermal cyst: Secondary | ICD-10-CM | POA: Insufficient documentation

## 2020-11-10 HISTORY — DX: Epidermal cyst: L72.0

## 2020-11-10 NOTE — Progress Notes (Signed)
[facility] History and Physical  Reason for Referral: Left posterior thigh cyst  Referring Physician:  Pablo Lawrence, NP   Chief Complaint   New Patient (Initial Visit)     Alexandra Hoffman is a 70 y.o. female.  HPI: Ms. Greif is a 70 yo who is healthy and has noticed a bump on her left posterior thigh for about 2 months. It was swollen and enlarged. Pablo Lawrence saw the patient and prescribed some antibiotics given that this was looking like a cyst.  The area has never been red or inflamed. She did have a prior cyst removal on her back when the cyst was infected and reports that this was very painful and required packing. She wanted to avoid this in the future.   She wants to proceed with getting the cyst removed.   Past Medical History:  Diagnosis Date   Anxiety    Arthritis    knees   Constipation    Depression    GERD (gastroesophageal reflux disease)    Hypertension     Past Surgical History:  Procedure Laterality Date   COLON SURGERY  2006   perforated sigmoid diverticulitis, Dr Arnoldo Morale   COLONOSCOPY  2006   Dr. Gala Romney: normal rectum and a few sigmoid diverticula, 7 mm angry pedunculated polyp at 30 cm, 4 mm sessile polyp base of cecum. Unknown path    COLONOSCOPY WITH PROPOFOL N/A 03/04/2017   Procedure: COLONOSCOPY WITH PROPOFOL;  Surgeon: Daneil Dolin, MD;  [facility];  Service: Endoscopy;  Laterality: N/A;  7:30am   COLONOSCOPY WITH PROPOFOL N/A 04/07/2020   Procedure: COLONOSCOPY WITH PROPOFOL;  Surgeon: Daneil Dolin, MD;  [facility];  Service: Endoscopy;  Laterality: N/A;  AM (wants early as possible due to nausea)   ESOPHAGOGASTRODUODENOSCOPY (EGD) WITH PROPOFOL N/A 03/19/2019   Rourk: Gastric polyps (biopsy showed fundic gland), small hiatal hernia.   POLYPECTOMY  03/04/2017   Procedure: POLYPECTOMY;  Surgeon: Daneil Dolin, MD;  [facility];  Service: Endoscopy;;  polyp at cecum and left colon    POLYPECTOMY  03/19/2019   Procedure: POLYPECTOMY;  Surgeon: Daneil Dolin, MD;  [facility];  Service: Endoscopy;;  gastric   POLYPECTOMY  04/07/2020   Procedure: POLYPECTOMY;  Surgeon: Daneil Dolin, MD;  [facility];  Service: Endoscopy;;    Family History  Problem Relation Age of Onset   Arthritis Mother    Depression Mother    Diabetes Mother    Heart disease Mother    Hypertension Mother    Stroke Mother    Kidney disease Mother 66       dialysis   Alcohol abuse Father    Arthritis Father    Cancer Father 49       brain   Hypertension Father    Hypertension Sister    Arthritis Sister    Hypertension Brother    Arthritis Brother    Hypertension Brother    Arthritis Brother    Hypertension Brother    Hypertension Brother    Hypertension Brother    Hypertension Brother    Other Brother        MVA   Colon cancer Neg Hx     Social History   Tobacco Use   Smoking status: Never   Smokeless tobacco: Never  Vaping Use   Vaping Use: Never used  Substance Use Topics   Alcohol use: No   Drug  use: No    Medications: I have reviewed the patient's current medications. Allergies as of 11/10/2020   No Known Allergies      Medication List        Accurate as of November 10, 2020  1:20 PM. If you have any questions, ask your nurse or doctor.          ALPRAZolam 0.5 MG tablet Commonly known as: XANAX Take 0.5 mg by mouth at bedtime.   Fish Oil 1200 MG Caps Take 1,200 mg by mouth in the morning.   guaiFENesin 600 MG 12 hr tablet Commonly known as: MUCINEX Take 600 mg by mouth in the morning.   levocetirizine 5 MG tablet Commonly known as: XYZAL Take 5 mg by mouth at bedtime.   Linzess 72 MCG capsule Generic drug: linaclotide TAKE ONE CAPSULE BY MOUTH ONCE DAILY WITH BREAKFAST.   lisinopril 10 MG tablet Commonly known as: ZESTRIL Take 10 mg by mouth in the morning.   meloxicam 15 MG tablet Commonly known as: MOBIC Take 15  mg by mouth 2 (two) times a week.   pantoprazole 40 MG tablet Commonly known as: PROTONIX TAKE ONE TABLET BY MOUTH ONCE DAILY BEFORE BREAKFAST.   vitamin B-12 1000 MCG tablet Commonly known as: CYANOCOBALAMIN Take 1,000 mcg by mouth in the morning.         ROS:  A comprehensive review of systems was negative except for: Gastrointestinal: positive for reflux symptoms Musculoskeletal: positive for joint pain  Blood pressure 125/80, pulse 78, temperature 98.4 F (36.9 C), temperature source Other (Comment), resp. rate 14, height 5\' 4"  (1.626 m), weight 192 lb (87.1 kg), last menstrual period 01/02/2004, SpO2 97 %. Physical Exam Vitals reviewed.  Constitutional:      Appearance: Normal appearance.  HENT:     Head: Normocephalic.     Nose: Nose normal.  Eyes:     Extraocular Movements: Extraocular movements intact.  Cardiovascular:     Rate and Rhythm: Normal rate and regular rhythm.  Pulmonary:     Effort: Pulmonary effort is normal.     Breath sounds: Normal breath sounds.  Abdominal:     General: There is no distension.     Palpations: Abdomen is soft.  Musculoskeletal:        General: Normal range of motion.     Cervical back: Normal range of motion.     Comments: Left posterior thigh with 1cm superficial mass, no erythema or drainage noted  Skin:    General: Skin is warm.  Neurological:     General: No focal deficit present.     Mental Status: She is alert and oriented to person, place, and time.  Psychiatric:        Mood and Affect: Mood normal.        Behavior: Behavior normal.    Results: None   Assessment & Plan:  Alexandra Hoffman is a 70 y.o. female with a cyst on the left posterior thigh. She wants to proceed with excision under local in the office. Discussed risk of bleeding, infection, and cyst recurrence. Recommended that she take tylenol before excision.    Future Appointments  Date Time Provider [place]  12/01/2020  3:15 PM Virl Cagey, MD [facility] None    All questions were answered to the satisfaction of the patient.    Virl Cagey 11/10/2020, 1:20 PM

## 2020-11-10 NOTE — Patient Instructions (Signed)
Epidermoid Cyst (Sebaceous) Removal Epidermoid cyst removal is a procedure to remove a fluid-filled sac that forms under your skin (epidermoid cyst). This type of cyst is filled with a thick, oily substance (keratin) that is secreted by your skin glands. Epidermoid cysts may also be called epidermal cysts, or keratin cysts. Normally, the skin secretes this pasty material through a gland or a hair follicle. However, when a skin gland or hair follicle becomes blocked, an epidermoid cyst can form. You may need this procedure if you have an epidermal cyst that becomes large, uncomfortable, or inflamed. Tell a health care provider about: Any allergies you have. All medicines you are taking, including vitamins, herbs, eye drops, creams, and over-the-counter medicines. Any problems you or family members have had with anesthetic medicines. Any blood disorders you have. Any surgeries you have had. Any medical conditions you have now or have had. Whether you are pregnant or may be pregnant. What are the risks? Generally, this is a safe procedure. However, problems may occur, including: Recurrence of the cyst. Bleeding. Infection. Scarring. What happens before the procedure? Ask your health care provider about: Changing or stopping your regular medicines. This is especially important if you are taking diabetes medicines or blood thinners. Taking medicines such as aspirin and ibuprofen. These medicines can thin your blood. Do not take these medicines unless your health care provider tells you to take them. Taking over-the-counter medicines, vitamins, herbs, and supplements. If you have an inflamed or infected cyst, you may have to take antibiotic medicine before the cyst removal. Take your antibiotic as told by your health care provider. Do not stop taking the antibiotic even if you start to feel better. Take a shower on the morning of your procedure. Your health care provider may ask you to use a  germ-killing soap. What happens during the procedure?  You will be given a medicine to numb the area (local anesthetic). The skin around the cyst will be cleaned with a germ-killing solution. The health care provider will make a small incision in your skin over the cyst. The health care provider will separate the cyst from the surrounding tissues that are under your skin. If possible, the cyst will be removed undamaged (intact). If the cyst bursts (ruptures), it will be removed in pieces. After the cyst is removed, the health care provider will control any bleeding and close the incision with small stitches (sutures). Small incisions may not need sutures, and the bleeding will be controlled by applying direct pressure with gauze. The health care provider may apply antibiotic ointment and a bandage (dressing) over the incision. The procedure may vary among health care providers and hospitals. What happens after the procedure? If you are prescribed an antibiotic medicine or ointment, take or apply it as told by your health care provider. Do not stop using the antibiotic even if you start to feel better. Summary Epidermoid cyst removal is a procedure to remove a sac that has formed under your skin. You may need this procedure if you have an epidermoid cyst that becomes large, uncomfortable, or inflamed. The health care provider will make a small incision in your skin to remove the cyst. If you are prescribed an antibiotic medicine before the procedure, after the procedure, or both, use the antibiotic as told by your health care provider. Do not stop using the antibiotic even if you start to feel better. This information is not intended to replace advice given to you by your health care provider. Make  sure you discuss any questions you have with your health care provider. Document Revised: 03/25/2019 Document Reviewed: 03/25/2019 Elsevier Patient Education  [place].

## 2020-11-11 DIAGNOSIS — J302 Other seasonal allergic rhinitis: Secondary | ICD-10-CM | POA: Diagnosis not present

## 2020-11-11 DIAGNOSIS — I10 Essential (primary) hypertension: Secondary | ICD-10-CM | POA: Diagnosis not present

## 2020-11-28 DIAGNOSIS — R531 Weakness: Secondary | ICD-10-CM | POA: Diagnosis not present

## 2020-11-28 DIAGNOSIS — R202 Paresthesia of skin: Secondary | ICD-10-CM | POA: Diagnosis not present

## 2020-11-28 DIAGNOSIS — M6281 Muscle weakness (generalized): Secondary | ICD-10-CM | POA: Diagnosis not present

## 2020-11-28 DIAGNOSIS — R200 Anesthesia of skin: Secondary | ICD-10-CM | POA: Diagnosis not present

## 2020-11-28 DIAGNOSIS — R638 Other symptoms and signs concerning food and fluid intake: Secondary | ICD-10-CM | POA: Diagnosis not present

## 2020-12-01 ENCOUNTER — Ambulatory Visit: Payer: Medicare Other | Admitting: General Surgery

## 2020-12-07 DIAGNOSIS — I10 Essential (primary) hypertension: Secondary | ICD-10-CM | POA: Diagnosis not present

## 2020-12-07 DIAGNOSIS — M542 Cervicalgia: Secondary | ICD-10-CM | POA: Diagnosis not present

## 2020-12-07 DIAGNOSIS — R29898 Other symptoms and signs involving the musculoskeletal system: Secondary | ICD-10-CM | POA: Diagnosis not present

## 2020-12-22 DIAGNOSIS — M5412 Radiculopathy, cervical region: Secondary | ICD-10-CM | POA: Diagnosis not present

## 2020-12-22 DIAGNOSIS — M47812 Spondylosis without myelopathy or radiculopathy, cervical region: Secondary | ICD-10-CM | POA: Diagnosis not present

## 2021-01-05 ENCOUNTER — Encounter: Payer: Self-pay | Admitting: General Surgery

## 2021-01-05 ENCOUNTER — Ambulatory Visit: Payer: Medicare Other | Admitting: General Surgery

## 2021-01-05 ENCOUNTER — Encounter: Payer: Self-pay | Admitting: *Deleted

## 2021-01-05 ENCOUNTER — Other Ambulatory Visit: Payer: Self-pay

## 2021-01-05 VITALS — BP 135/80 | HR 82 | Temp 98.6°F | Resp 16 | Ht 64.0 in | Wt 195.0 lb

## 2021-01-05 DIAGNOSIS — L720 Epidermal cyst: Secondary | ICD-10-CM

## 2021-01-05 DIAGNOSIS — D179 Benign lipomatous neoplasm, unspecified: Secondary | ICD-10-CM | POA: Diagnosis not present

## 2021-01-05 DIAGNOSIS — D1724 Benign lipomatous neoplasm of skin and subcutaneous tissue of left leg: Secondary | ICD-10-CM | POA: Diagnosis not present

## 2021-01-05 NOTE — Patient Instructions (Signed)
Ok to remove your bandaid in 48 hours. After that you can replace and can do neosporin on the area daily.   Lipoma Removal, Care After This sheet gives you information about how to care for yourself after your procedure. Your health care provider may also give you more specific instructions. If you have problems or questions, contact your health care provider. What can I expect after the procedure? After the procedure, it is common to have: Mild pain. Swelling. Bruising. Follow these instructions at home: Bathing  Do not take baths, swim, or use a hot tub until your health care provider approves.  You may shower.  Keep your bandage (dressing) dry until your health care provider says it can be removed. Incision care  Follow instructions from your health care provider about how to take care of your incision. Make sure you: Wash your hands with soap and water for at least 20 seconds before and after you change your dressing. If soap and water are not available, use hand sanitizer. Change your dressing as told by your health care provider. Leave stitches (sutures), skin glue, or adhesive strips in place. These skin closures may need to stay in place for 2 weeks or longer. If adhesive strip edges start to loosen and curl up, you may trim the loose edges. Do not remove adhesive strips completely unless your health care provider tells you to do that. Check your incision area every day for signs of infection. Check for: More redness, swelling, or pain. Fluid or blood. Warmth. Pus or a bad smell. Medicines Take over-the-counter and prescription medicines only as told by your health care provider. If you were prescribed an antibiotic medicine, use it as told by your health care provider. Do not stop using the antibiotic even if you start to feel better. General instructions  If you were given a sedative during the procedure, it can affect you for several hours. Do not drive or operate machinery  until your health care provider says that it is safe. Do not use any products that contain nicotine or tobacco, such as cigarettes, e-cigarettes, and chewing tobacco. These can delay healing. If you need help quitting, ask your health care provider. Return to your normal activities as told by your health care provider. Ask your health care provider what activities are safe for you. Keep all follow-up visits as told by your health care provider. This is important. Contact a health care provider if: You have more redness, swelling, or pain around your incision. You have fluid or blood coming from your incision. Your incision feels warm to the touch. You have pus or a bad smell coming from your incision. You have pain that does not get better with medicine. Get help right away if: You have chills or a fever. You have severe pain. Summary After the procedure, it is common to have mild pain, swelling, and bruising. Follow instructions from your health care provider about how to take care of your incision. Check your incision area every day for signs of infection. Contact a health care provider if you have more redness, swelling, or pain around your incision. This information is not intended to replace advice given to you by your health care provider. Make sure you discuss any questions you have with your health care provider. Document Revised: 08/04/2018 Document Reviewed: 08/04/2018 Elsevier Patient Education  [place].

## 2021-01-06 NOTE — Progress Notes (Signed)
[facility] Operative Note  01/06/21  Pre- procedure Diagnosis:  Left thigh mass    Post- procedure Diagnosis: Same   Procedure(s) Performed: Excision of thigh mass (1cm)   Surgeon: Lanell Matar. Constance Haw, MD   Assistants: No qualified resident was available    Anesthesia: Lidocaine 1%    Specimens: Lipoma like lesion (initially felt to be a cyst)    Estimated Blood Loss: Minimal   Wound Class:Clean   Findings:1cm lipomatous lesion    Procedure: The patient was taken to the procedure room and placed on her right side exposing her left posterior/ lateral thigh. The area was prepped and draped and lidocaine was injected. An incision was made and sharp dissection with scissors was used to excise the lipomatous lesion. This was about 1cm in size.  The cavity was made hemostatic. The cavity was closed with interrupted 3-0 Vicryl. The skin was closed with interrupted 3-0 Nylon. A gauze and tegaderm dressing were placed.   Final inspection revealed acceptable hemostasis. The patient tolerated the procedure well.    Curlene Labrum, MD [facility] [address] [place], [place] [address] [phone] (office)

## 2021-01-09 LAB — TISSUE SPECIMEN

## 2021-01-09 LAB — PATHOLOGY REPORT

## 2021-01-12 DIAGNOSIS — M542 Cervicalgia: Secondary | ICD-10-CM | POA: Diagnosis not present

## 2021-01-12 DIAGNOSIS — I10 Essential (primary) hypertension: Secondary | ICD-10-CM | POA: Diagnosis not present

## 2021-01-12 DIAGNOSIS — M1990 Unspecified osteoarthritis, unspecified site: Secondary | ICD-10-CM | POA: Diagnosis not present

## 2021-01-12 DIAGNOSIS — G4700 Insomnia, unspecified: Secondary | ICD-10-CM | POA: Diagnosis not present

## 2021-01-12 DIAGNOSIS — Z0189 Encounter for other specified special examinations: Secondary | ICD-10-CM | POA: Diagnosis not present

## 2021-01-12 DIAGNOSIS — K589 Irritable bowel syndrome without diarrhea: Secondary | ICD-10-CM | POA: Diagnosis not present

## 2021-01-12 DIAGNOSIS — J302 Other seasonal allergic rhinitis: Secondary | ICD-10-CM | POA: Diagnosis not present

## 2021-01-12 DIAGNOSIS — K219 Gastro-esophageal reflux disease without esophagitis: Secondary | ICD-10-CM | POA: Diagnosis not present

## 2021-01-12 DIAGNOSIS — R42 Dizziness and giddiness: Secondary | ICD-10-CM | POA: Diagnosis not present

## 2021-01-13 ENCOUNTER — Other Ambulatory Visit: Payer: Self-pay

## 2021-01-13 ENCOUNTER — Ambulatory Visit (INDEPENDENT_AMBULATORY_CARE_PROVIDER_SITE_OTHER): Payer: Medicare Other | Admitting: Ophthalmology

## 2021-01-13 ENCOUNTER — Encounter (INDEPENDENT_AMBULATORY_CARE_PROVIDER_SITE_OTHER): Payer: Self-pay | Admitting: Ophthalmology

## 2021-01-13 DIAGNOSIS — H33103 Unspecified retinoschisis, bilateral: Secondary | ICD-10-CM

## 2021-01-13 DIAGNOSIS — H33322 Round hole, left eye: Secondary | ICD-10-CM

## 2021-01-13 DIAGNOSIS — H35033 Hypertensive retinopathy, bilateral: Secondary | ICD-10-CM

## 2021-01-13 DIAGNOSIS — I10 Essential (primary) hypertension: Secondary | ICD-10-CM | POA: Diagnosis not present

## 2021-01-13 DIAGNOSIS — H25813 Combined forms of age-related cataract, bilateral: Secondary | ICD-10-CM | POA: Diagnosis not present

## 2021-01-13 MED ORDER — PREDNISOLONE ACETATE 1 % OP SUSP: 1.0000 [drp] | Freq: Four times a day (QID) | OPHTHALMIC | 0 refills | Status: AC

## 2021-01-13 NOTE — Progress Notes (Addendum)
[facility] Note  01/13/2021     CHIEF COMPLAINT Patient presents for Retina Evaluation   HISTORY OF PRESENT ILLNESS: Alexandra Hoffman is a 71 y.o. female who presents to the clinic today for:   HPI     Retina Evaluation   In left eye.  This started 4 days ago.  I, the attending physician,  performed the HPI with the patient and updated documentation appropriately.        Comments   Retina eval per Dr. Jorja Loa for RD [facility]-  Patient states she was told she had fluid in her eye OS.  No symptoms, she was being seen for a regular eye exam.      Last edited by [name], MD on 01/13/2021 12:14 PM.    Pt is here on the referral of Dr. Jorja Loa for RD [facility], pt states she saw him for a routine exam, Dr. Jorja Loa told pt she had fluid in her eye that was not there last year, pt endorses being hypertensive, but denies diabetes  Referring physician: Madelin Headings, DO [address]  [place], [place] [id number]  HISTORICAL INFORMATION:   Selected notes from the MEDICAL RECORD NUMBER Referred by Dr. Jorja Loa LEE: 01/09/2021 Ocular Hx- Cataracts, DES OU, RD OS    CURRENT MEDICATIONS: Current Outpatient Medications (Ophthalmic Drugs)  Medication Sig   prednisoLONE acetate (PRED FORTE) 1 % ophthalmic suspension Place 1 drop into the left eye 4 (four) times daily for 7 days.   No current facility-administered medications for this visit. (Ophthalmic Drugs)   Current Outpatient Medications (Other)  Medication Sig   ALPRAZolam (XANAX) 0.5 MG tablet Take 0.5 mg by mouth at bedtime.   guaiFENesin (MUCINEX) 600 MG 12 hr tablet Take 600 mg by mouth in the morning.   levocetirizine (XYZAL) 5 MG tablet Take 5 mg by mouth at bedtime.   LINZESS 72 MCG capsule TAKE ONE CAPSULE BY MOUTH ONCE DAILY WITH BREAKFAST.   lisinopril (PRINIVIL,ZESTRIL) 10 MG tablet Take 10 mg by mouth in the morning.   meloxicam (MOBIC) 15 MG tablet Take 15 mg by mouth 2 (two) times a week.   Omega-3  Fatty Acids (FISH OIL) 1200 MG CAPS Take 1,200 mg by mouth in the morning.   pantoprazole (PROTONIX) 40 MG tablet TAKE ONE TABLET BY MOUTH ONCE DAILY BEFORE BREAKFAST.   vitamin B-12 (CYANOCOBALAMIN) 1000 MCG tablet Take 1,000 mcg by mouth in the morning.   No current facility-administered medications for this visit. (Other)   REVIEW OF SYSTEMS: ROS   Positive for: Gastrointestinal, Musculoskeletal, Eyes, Psychiatric Negative for: Constitutional, Neurological, Skin, Genitourinary, HENT, Endocrine, Cardiovascular, Respiratory, Allergic/Imm, Heme/Lymph Last edited by Leonie Douglas, COA on 01/13/2021  9:15 AM.     ALLERGIES No Known Allergies  PAST MEDICAL HISTORY Past Medical History:  Diagnosis Date   Anxiety    Arthritis    knees   Constipation    Depression    GERD (gastroesophageal reflux disease)    Hypertension    Past Surgical History:  Procedure Laterality Date   COLON SURGERY  2006   perforated sigmoid diverticulitis, Dr Arnoldo Morale   COLONOSCOPY  2006   Dr. Gala Romney: normal rectum and a few sigmoid diverticula, 7 mm angry pedunculated polyp at 30 cm, 4 mm sessile polyp base of cecum. Unknown path    COLONOSCOPY WITH PROPOFOL N/A 03/04/2017   Procedure: COLONOSCOPY WITH PROPOFOL;  Surgeon: Daneil Dolin, MD;  [facility];  Service: Endoscopy;  Laterality: N/A;  7:30am   COLONOSCOPY WITH PROPOFOL N/A 04/07/2020   Procedure: COLONOSCOPY WITH PROPOFOL;  Surgeon: Daneil Dolin, MD;  Location: [facility];  Service: Endoscopy;  Laterality: N/A;  AM (wants early as possible due to nausea)   ESOPHAGOGASTRODUODENOSCOPY (EGD) WITH PROPOFOL N/A 03/19/2019   Rourk: Gastric polyps (biopsy showed fundic gland), small hiatal hernia.   POLYPECTOMY  03/04/2017   Procedure: POLYPECTOMY;  Surgeon: Daneil Dolin, MD;  [facility]: [facility];  Service: Endoscopy;;  polyp at cecum and left colon   POLYPECTOMY  03/19/2019   Procedure: POLYPECTOMY;  Surgeon: Daneil Dolin, MD;   [facility]: [facility];  Service: Endoscopy;;  gastric   POLYPECTOMY  04/07/2020   Procedure: POLYPECTOMY;  Surgeon: Daneil Dolin, MD;  [facility];  Service: Endoscopy;;   FAMILY HISTORY Family History  Problem Relation Age of Onset   Arthritis Mother    Depression Mother    Diabetes Mother    Heart disease Mother    Hypertension Mother    Stroke Mother    Kidney disease Mother 72       dialysis   Alcohol abuse Father    Arthritis Father    Cancer Father 70       brain   Hypertension Father    Hypertension Sister    Arthritis Sister    Hypertension Brother    Arthritis Brother    Hypertension Brother    Arthritis Brother    Hypertension Brother    Hypertension Brother    Hypertension Brother    Hypertension Brother    Other Brother        MVA   Colon cancer Neg Hx    SOCIAL HISTORY Social History   Tobacco Use   Smoking status: Never   Smokeless tobacco: Never  Vaping Use   Vaping Use: Never used  Substance Use Topics   Alcohol use: No   Drug use: No       OPHTHALMIC EXAM:  Base Eye Exam     Visual Acuity (Snellen - Linear)       Right Left   Dist cc 20/20 -2 20/20    Correction: Glasses         Tonometry (Tonopen, 9:21 AM)       Right Left   Pressure 21 20         Pupils       Dark Light Shape React APD   Right 3 2 Round Brisk None   Left 3 2 Round Brisk None         Visual Fields (Counting fingers)       Left Right    Full Full         Extraocular Movement       Right Left    Full Full         Neuro/Psych     Oriented x3: Yes   Mood/Affect: Normal         Dilation     Both eyes: 1.0% Mydriacyl, 2.5% Phenylephrine @ 9:21 AM           Slit Lamp and Fundus Exam     Slit Lamp Exam       Right Left   Lids/Lashes Dermatochalasis - upper lid Dermatochalasis - upper lid   Conjunctiva/Sclera small nasal and temporal pinguecula small temporal pinguecula   Cornea arcus, 1+ fine Punctate  epithelial erosions, tear film debris arcus, 1-2+ Punctate epithelial erosions,  tear film debris   Anterior Chamber Deep and quiet Deep and quiet   Iris Round and dilated Round and dilated   Lens 2+ Nuclear sclerosis, 2+ Cortical cataract 2+ Nuclear sclerosis, 2+ Cortical cataract   Anterior Vitreous Mild Vitreous syneresis Mild Vitreous syneresis         Fundus Exam       Right Left   Disc Pink and Sharp Pink and Sharp, mild tilt   C/D Ratio 0.5 0.3   Macula Flat, Blunted foveal reflex, RPE mottling Flat, good foveal reflex, RPE mottling, No heme or edema   Vessels attenuated, mild tortuousity, mild Copper wiring attenuated, mild tortuousity, mild Copper wiring, mild AV crossing changes   Periphery Attached, focal schisis cavity at 0730 -- very tiny, no outer retinal hole, mild reticular degeneration Attached, bullous schisis cavity from 0100-0200 periphery, shallow schisis cavity IT periphery w/ peripheral outer retinal hole, reticular degeneration           Refraction     Wearing Rx       Sphere Cylinder Axis Add   Right +1.25 +0.25 060 +2.25   Left +1.25 Sphere  +2.25           IMAGING AND PROCEDURES  Imaging and Procedures for 01/13/2021  OCT, Retina - OU - Both Eyes       Right Eye Quality was good. Central Foveal Thickness: 254. Progression has no prior data. Findings include normal foveal contour, no IRF, no SRF, vitreomacular adhesion .   Left Eye Quality was good. Progression has no prior data. Findings include normal foveal contour, intraretinal fluid, no SRF, vitreomacular adhesion (Bullous retinoschisis ST periphery caught on widefield).   Notes *Images captured and stored on drive  Diagnosis / Impression:  OD: NFP, no IRF/SRF OS: Bullous retinoschisis ST periphery caught on widefield  Clinical management:  See below  Abbreviations: NFP - Normal foveal profile. CME - cystoid macular edema. PED - pigment epithelial detachment. IRF - intraretinal  fluid. SRF - subretinal fluid. EZ - ellipsoid zone. ERM - epiretinal membrane. ORA - outer retinal atrophy. ORT - outer retinal tubulation. SRHM - subretinal hyper-reflective material. IRHM - intraretinal hyper-reflective material      Repair Retinal Breaks, Laser - OS - Left Eye       LASER PROCEDURE NOTE  Procedure:  Barrier laser retinopexy using slit lamp laser, LEFT eye   Diagnosis:   Retinoschisis, LEFT eye                     Bullous schisis cavity ST periphery from 1-2 o'clock anterior to equator                         Shallow retinoschisis IT periphery with peripheral outer retinal hole at 0730 anterior to equator  Surgeon: [name], MD, PhD  Anesthesia: Topical  Informed consent obtained, operative eye marked, and time out performed prior to initiation of laser.   Laser settings:  Lumenis Smart532 laser, slit lamp Lens: Mainster PRP 165 Power: 270 mW Spot size: 200 microns Duration: 30 msec  # spots: 824  Placement of laser: Using a Mainster PRP 165 contact lens at the slit lamp, laser was placed in three+ confluent rows around retinoschisis cavities in ST and IT quadrants w/ anterior extension to the ora.  Complications: None.  Patient tolerated the procedure well and received written and verbal post-procedure care information/education.  ASSESSMENT/PLAN:    ICD-10-CM   1. Bilateral retinoschisis  H33.103 OCT, Retina - OU - Both Eyes    Repair Retinal Breaks, Laser - OS - Left Eye    2. Retinal hole of left eye  H33.322 Repair Retinal Breaks, Laser - OS - Left Eye    3. Essential hypertension  I10     4. Hypertensive retinopathy of both eyes  H35.033     5. Combined forms of age-related cataract of both eyes  H25.813      1,2. Retinoschisis OU  - OD w/ shallow schisis cavity IT periphery  - OS w/ bullous retinoschisis ST periphery (0100-0200) and shallow schisis IT periphery w/ peripheral outer retinal hole  - asymptomatic, BCVA  20/20  - OS lesion found on routine exam by Dr. Jorja Loa  - recommend laser retinopexy OS today, 01.13.23 to patches of schisis  - pt wishes to proceed with laser OS  - RBA of procedure discussed, questions answered - informed consent obtained and signed - see procedure note - start PF QID OS x7 days - f/u 2 weeks, DFE, OCT  3,4. Hypertensive retinopathy OU - discussed importance of tight BP control - monitor  5. Mixed Cataract OU - The symptoms of cataract, surgical options, and treatments and risks were discussed with patient. - discussed diagnosis and progression - not yet visually significant - monitor for now  Ophthalmic Meds Ordered this visit:  Meds ordered this encounter  Medications   prednisoLONE acetate (PRED FORTE) 1 % ophthalmic suspension    Sig: Place 1 drop into the left eye 4 (four) times daily for 7 days.    Dispense:  10 mL    Refill:  0     Return in about 2 weeks (around 01/27/2021) for f/u retininoschisis OS, DFE, OCT.  There are no Patient Instructions on file for this visit.   Explained the diagnoses, plan, and follow up with the patient and they expressed understanding.  Patient expressed understanding of the importance of proper follow up care.   This document serves as a record of services personally performed by Gardiner Sleeper, MD, PhD. It was created on their behalf by Leonie Douglas, an ophthalmic technician. The creation of this record is the provider's dictation and/or activities during the visit.    Electronically signed by: Leonie Douglas COA, 01/13/21  12:25 PM  Gardiner Sleeper, M.D., Ph.D. Diseases & Surgery of the Retina and [place] 01/13/2021  I have reviewed the above documentation for accuracy and completeness, and I agree with the above. Gardiner Sleeper, M.D., Ph.D. 01/13/21 12:25 PM   Abbreviations: M myopia (nearsighted); A astigmatism; H hyperopia (farsighted); P presbyopia; Mrx spectacle  prescription;  CTL contact lenses; OD right eye; OS left eye; OU both eyes  XT exotropia; ET esotropia; PEK punctate epithelial keratitis; PEE punctate epithelial erosions; DES dry eye syndrome; MGD meibomian gland dysfunction; ATs artificial tears; PFAT's preservative free artificial tears; [place] nuclear sclerotic cataract; PSC posterior subcapsular cataract; ERM epi-retinal membrane; PVD posterior vitreous detachment; RD retinal detachment; DM diabetes mellitus; DR diabetic retinopathy; NPDR non-proliferative diabetic retinopathy; PDR proliferative diabetic retinopathy; CSME clinically significant macular edema; DME diabetic macular edema; dbh dot blot hemorrhages; CWS cotton wool spot; POAG primary open angle glaucoma; C/D cup-to-disc ratio; HVF humphrey visual field; GVF goldmann visual field; OCT optical coherence tomography; IOP intraocular pressure; BRVO Branch retinal vein occlusion; CRVO central retinal vein occlusion; CRAO central retinal artery occlusion; BRAO branch  retinal artery occlusion; RT retinal tear; SB scleral buckle; PPV pars plana vitrectomy; VH Vitreous hemorrhage; PRP panretinal laser photocoagulation; IVK intravitreal kenalog; VMT vitreomacular traction; MH Macular hole;  NVD neovascularization of the disc; NVE neovascularization elsewhere; AREDS age related eye disease study; ARMD age related macular degeneration; POAG primary open angle glaucoma; EBMD epithelial/anterior basement membrane dystrophy; ACIOL anterior chamber intraocular lens; IOL intraocular lens; PCIOL posterior chamber intraocular lens; Phaco/IOL phacoemulsification with intraocular lens placement; [place] photorefractive keratectomy; LASIK laser assisted in situ keratomileusis; HTN hypertension; DM diabetes mellitus; COPD chronic obstructive pulmonary disease

## 2021-01-16 DIAGNOSIS — J019 Acute sinusitis, unspecified: Secondary | ICD-10-CM | POA: Insufficient documentation

## 2021-01-16 HISTORY — DX: Acute sinusitis, unspecified: J01.90

## 2021-01-19 ENCOUNTER — Encounter: Payer: Self-pay | Admitting: General Surgery

## 2021-01-19 ENCOUNTER — Other Ambulatory Visit: Payer: Self-pay

## 2021-01-19 ENCOUNTER — Ambulatory Visit (INDEPENDENT_AMBULATORY_CARE_PROVIDER_SITE_OTHER): Payer: Medicare Other | Admitting: General Surgery

## 2021-01-19 VITALS — BP 124/63 | HR 81 | Temp 98.4°F | Resp 14 | Ht 64.0 in | Wt 195.0 lb

## 2021-01-19 DIAGNOSIS — D1724 Benign lipomatous neoplasm of skin and subcutaneous tissue of left leg: Secondary | ICD-10-CM

## 2021-01-19 NOTE — Patient Instructions (Signed)
Steristrips will peel off in the next 5-7 days. You can remove them once they are peeling off. It is ok to shower. Pat the area dry.   Call with questions or concerns.

## 2021-01-19 NOTE — Progress Notes (Signed)
[facility]  No complaints  BP 124/63    Pulse 81    Temp 98.4 F (36.9 C) (Other (Comment))    Resp 14    Ht 5\' 4"  (1.626 m)    Wt 195 lb (88.5 kg)    LMP 01/02/2004 (Approximate)    SpO2 98%    BMI 33.47 kg/m   Sutures removed from left thigh, steri strips placed, no erythema or drainage  Lipoma on pathology.  Patient sp lipoma excision from left thigh.  PRN Follow up  Steristrips will peel off in the next 5-7 days. You can remove them once they are peeling off. It is ok to shower. Pat the area dry.

## 2021-01-24 DIAGNOSIS — J069 Acute upper respiratory infection, unspecified: Secondary | ICD-10-CM | POA: Diagnosis not present

## 2021-01-24 NOTE — Progress Notes (Signed)
[facility] Note  01/27/2021     CHIEF COMPLAINT Patient presents for Retina Follow Up   HISTORY OF PRESENT ILLNESS: Alexandra Hoffman is a 71 y.o. female who presents to the clinic today for:   HPI     Retina Follow Up   Patient presents with  Other.  In both eyes.  Severity is moderate.  Duration of 2 weeks.  Since onset it is gradually improving.  I, the attending physician,  performed the HPI with the patient and updated documentation appropriately.        Comments   Pt here for 2 wk ret f/u for schisis OU, s/p retinopexy OS 1.13.23. Pt states vision cleared up OS after laser procedure but it did take a few days. No major changes or vision issues reported today. No discomfort or pain. She did the PF gtts x 1 wk OS.       Last edited by [name], MD on 01/27/2021  2:44 PM.     Pt states she had no problems after the laser procedure on her left eye, she is not seeing any floaters, she used PF for a week after laser as directed  Referring physician: Madelin Headings, DO [address]  [place], [place] [id number]  HISTORICAL INFORMATION:   Selected notes from the MEDICAL RECORD NUMBER Referred by Dr. Jorja Loa LEE: 01/09/2021 Ocular Hx- Cataracts, DES OU, RD OS    CURRENT MEDICATIONS: No current outpatient medications on file. (Ophthalmic Drugs)   No current facility-administered medications for this visit. (Ophthalmic Drugs)   Current Outpatient Medications (Other)  Medication Sig   ALPRAZolam (XANAX) 0.5 MG tablet Take 0.5 mg by mouth at bedtime.   guaiFENesin (MUCINEX) 600 MG 12 hr tablet Take 600 mg by mouth in the morning.   levocetirizine (XYZAL) 5 MG tablet Take 5 mg by mouth at bedtime.   LINZESS 72 MCG capsule TAKE ONE CAPSULE BY MOUTH ONCE DAILY WITH BREAKFAST.   lisinopril (PRINIVIL,ZESTRIL) 10 MG tablet Take 10 mg by mouth in the morning.   meloxicam (MOBIC) 15 MG tablet Take 15 mg by mouth 2 (two) times a week.   Omega-3 Fatty  Acids (FISH OIL) 1200 MG CAPS Take 1,200 mg by mouth in the morning.   pantoprazole (PROTONIX) 40 MG tablet TAKE ONE TABLET BY MOUTH ONCE DAILY BEFORE BREAKFAST.   vitamin B-12 (CYANOCOBALAMIN) 1000 MCG tablet Take 1,000 mcg by mouth in the morning.   No current facility-administered medications for this visit. (Other)   REVIEW OF SYSTEMS: ROS   Positive for: Gastrointestinal, Musculoskeletal, Eyes, Psychiatric Negative for: Constitutional, Neurological, Skin, Genitourinary, HENT, Endocrine, Cardiovascular, Respiratory, Allergic/Imm, Heme/Lymph Last edited by Kingsley Spittle, COT on 01/27/2021  8:46 AM.     ALLERGIES No Known Allergies  PAST MEDICAL HISTORY Past Medical History:  Diagnosis Date   Anxiety    Arthritis    knees   Constipation    Depression    GERD (gastroesophageal reflux disease)    Hypertension    Past Surgical History:  Procedure Laterality Date   COLON SURGERY  2006   perforated sigmoid diverticulitis, Dr Arnoldo Morale   COLONOSCOPY  2006   Dr. Gala Romney: normal rectum and a few sigmoid diverticula, 7 mm angry pedunculated polyp at 30 cm, 4 mm sessile polyp base of cecum. Unknown path    COLONOSCOPY WITH PROPOFOL N/A 03/04/2017   Procedure: COLONOSCOPY WITH PROPOFOL;  Surgeon: Daneil Dolin, MD;  [facility];  Service: Endoscopy;  Laterality: N/A;  7:30am   COLONOSCOPY WITH PROPOFOL N/A 04/07/2020   Procedure: COLONOSCOPY WITH PROPOFOL;  Surgeon: Daneil Dolin, MD;  [facility];  Service: Endoscopy;  Laterality: N/A;  AM (wants early as possible due to nausea)   ESOPHAGOGASTRODUODENOSCOPY (EGD) WITH PROPOFOL N/A 03/19/2019   Rourk: Gastric polyps (biopsy showed fundic gland), small hiatal hernia.   POLYPECTOMY  03/04/2017   Procedure: POLYPECTOMY;  Surgeon: Daneil Dolin, MD;  [facility];  Service: Endoscopy;;  polyp at cecum and left colon   POLYPECTOMY  03/19/2019   Procedure: POLYPECTOMY;  Surgeon: Daneil Dolin, MD;   [facility];  Service: Endoscopy;;  gastric   POLYPECTOMY  04/07/2020   Procedure: POLYPECTOMY;  Surgeon: Daneil Dolin, MD;  [facility];  Service: Endoscopy;;   FAMILY HISTORY Family History  Problem Relation Age of Onset   Arthritis Mother    Depression Mother    Diabetes Mother    Heart disease Mother    Hypertension Mother    Stroke Mother    Kidney disease Mother 74       dialysis   Alcohol abuse Father    Arthritis Father    Cancer Father 31       brain   Hypertension Father    Hypertension Sister    Arthritis Sister    Hypertension Brother    Arthritis Brother    Hypertension Brother    Arthritis Brother    Hypertension Brother    Hypertension Brother    Hypertension Brother    Hypertension Brother    Other Brother        MVA   Colon cancer Neg Hx    SOCIAL HISTORY Social History   Tobacco Use   Smoking status: Never   Smokeless tobacco: Never  Vaping Use   Vaping Use: Never used  Substance Use Topics   Alcohol use: No   Drug use: No       OPHTHALMIC EXAM:  Base Eye Exam     Visual Acuity (Snellen - Linear)       Right Left   Dist cc 20/20 20/20 -1    Correction: Glasses         Tonometry (Tonopen, 8:49 AM)       Right Left   Pressure 20 19         Pupils       Dark Light Shape React APD   Right 3 2 Round Brisk None   Left 3 2 Round Brisk None         Visual Fields (Counting fingers)       Left Right    Full Full         Extraocular Movement       Right Left    Full, Ortho Full, Ortho         Neuro/Psych     Oriented x3: Yes   Mood/Affect: Normal         Dilation     Both eyes: 1.0% Mydriacyl, 2.5% Phenylephrine @ 8:50 AM           Slit Lamp and Fundus Exam     Slit Lamp Exam       Right Left   Lids/Lashes Dermatochalasis - upper lid Dermatochalasis - upper lid   Conjunctiva/Sclera small nasal and temporal pinguecula small temporal pinguecula   Cornea arcus, 1+ fine  Punctate epithelial erosions, tear film debris  arcus, 1-2+ Punctate epithelial erosions, tear film debris   Anterior Chamber Deep and quiet Deep and quiet   Iris Round and dilated Round and dilated   Lens 2+ Nuclear sclerosis, 2+ Cortical cataract 2+ Nuclear sclerosis, 2+ Cortical cataract   Anterior Vitreous Mild Vitreous syneresis Mild Vitreous syneresis         Fundus Exam       Right Left   Disc Pink and Sharp Pink and Sharp, mild tilt   C/D Ratio 0.5 0.3   Macula Flat, good foveal reflex, RPE mottling Flat, good foveal reflex, RPE mottling, No heme or edema   Vessels attenuated, mild tortuousity, mild Copper wiring attenuated, mild tortuousity, mild Copper wiring, mild AV crossing changes   Periphery Attached, focal schisis cavity at 0730 -- very tiny, no outer retinal hole, mild reticular degeneration, pigmented cystoid degeneration Attached, bullous schisis cavity from 0100-0200 periphery, shallow schisis cavity IT periphery w/ peripheral outer retinal hole -- good laser changes surrounding, reticular degeneration           Refraction     Wearing Rx       Sphere Cylinder Axis Add   Right +1.25 +0.25 060 +2.25   Left +1.25 Sphere  +2.25           IMAGING AND PROCEDURES  Imaging and Procedures for 01/27/2021  OCT, Retina - OU - Both Eyes       Right Eye Quality was good. Central Foveal Thickness: 258. Progression has been stable. Findings include normal foveal contour, no IRF, no SRF, vitreomacular adhesion .   Left Eye Quality was good. Central Foveal Thickness: 261. Progression has been stable. Findings include normal foveal contour, intraretinal fluid, no SRF, vitreomacular adhesion (Bullous retinoschisis ST periphery caught on widefield - not imaged today).   Notes *Images captured and stored on drive  Diagnosis / Impression:  OD: NFP, no IRF/SRF OS: Bullous retinoschisis ST periphery caught on widefield - not imaged today  Clinical management:  See  below  Abbreviations: NFP - Normal foveal profile. CME - cystoid macular edema. PED - pigment epithelial detachment. IRF - intraretinal fluid. SRF - subretinal fluid. EZ - ellipsoid zone. ERM - epiretinal membrane. ORA - outer retinal atrophy. ORT - outer retinal tubulation. SRHM - subretinal hyper-reflective material. IRHM - intraretinal hyper-reflective material             ASSESSMENT/PLAN:    ICD-10-CM   1. Bilateral retinoschisis  H33.103 OCT, Retina - OU - Both Eyes    2. Retinal hole of left eye  H33.322     3. Essential hypertension  I10     4. Hypertensive retinopathy of both eyes  H35.033     5. Combined forms of age-related cataract of both eyes  H25.813      1,2. Retinoschisis OU  - OD w/ shallow schisis cavity IT periphery  - OS w/ bullous retinoschisis ST periphery (0100-0200) and shallow schisis IT periphery w/ peripheral outer retinal hole  - asymptomatic, BCVA 20/20  - OS lesion found on routine exam by Dr. Jorja Loa  - s/p laser retinopexy OS (01.13.23) to patches of schisis -- good laser changes in place surrounding schisis - completed PF QID OS x7 days - f/u 2-3 months, DFE, OCT  3,4. Hypertensive retinopathy OU - discussed importance of tight BP control - monitor   5. Mixed Cataract OU - The symptoms of cataract, surgical options, and treatments and risks were discussed with patient. - discussed diagnosis and progression -  not yet visually significant - monitor for now  Ophthalmic Meds Ordered this visit:  No orders of the defined types were placed in this encounter.    Return for f/u 2-3 months, retinoschisis OU, DFE, OCT.  There are no Patient Instructions on file for this visit.   Explained the diagnoses, plan, and follow up with the patient and they expressed understanding.  Patient expressed understanding of the importance of proper follow up care.   This document serves as a record of services personally performed by Gardiner Sleeper, MD,  PhD. It was created on their behalf by Leonie Douglas, an ophthalmic technician. The creation of this record is the provider's dictation and/or activities during the visit.    Electronically signed by: Leonie Douglas COA, 01/27/21  2:49 PM  Gardiner Sleeper, M.D., Ph.D. Diseases & Surgery of the Retina and [place] 01/27/2021  I have reviewed the above documentation for accuracy and completeness, and I agree with the above. Gardiner Sleeper, M.D., Ph.D. 01/27/21 2:49 PM   Abbreviations: M myopia (nearsighted); A astigmatism; H hyperopia (farsighted); P presbyopia; Mrx spectacle prescription;  CTL contact lenses; OD right eye; OS left eye; OU both eyes  XT exotropia; ET esotropia; PEK punctate epithelial keratitis; PEE punctate epithelial erosions; DES dry eye syndrome; MGD meibomian gland dysfunction; ATs artificial tears; PFAT's preservative free artificial tears; [place] nuclear sclerotic cataract; PSC posterior subcapsular cataract; ERM epi-retinal membrane; PVD posterior vitreous detachment; RD retinal detachment; DM diabetes mellitus; DR diabetic retinopathy; NPDR non-proliferative diabetic retinopathy; PDR proliferative diabetic retinopathy; CSME clinically significant macular edema; DME diabetic macular edema; dbh dot blot hemorrhages; CWS cotton wool spot; POAG primary open angle glaucoma; C/D cup-to-disc ratio; HVF humphrey visual field; GVF goldmann visual field; OCT optical coherence tomography; IOP intraocular pressure; BRVO Branch retinal vein occlusion; CRVO central retinal vein occlusion; CRAO central retinal artery occlusion; BRAO branch retinal artery occlusion; RT retinal tear; SB scleral buckle; PPV pars plana vitrectomy; VH Vitreous hemorrhage; PRP panretinal laser photocoagulation; IVK intravitreal kenalog; VMT vitreomacular traction; MH Macular hole;  NVD neovascularization of the disc; NVE neovascularization elsewhere; AREDS age related eye disease study;  ARMD age related macular degeneration; POAG primary open angle glaucoma; EBMD epithelial/anterior basement membrane dystrophy; ACIOL anterior chamber intraocular lens; IOL intraocular lens; PCIOL posterior chamber intraocular lens; Phaco/IOL phacoemulsification with intraocular lens placement; [place] photorefractive keratectomy; LASIK laser assisted in situ keratomileusis; HTN hypertension; DM diabetes mellitus; COPD chronic obstructive pulmonary disease

## 2021-01-27 ENCOUNTER — Encounter (INDEPENDENT_AMBULATORY_CARE_PROVIDER_SITE_OTHER): Payer: Self-pay | Admitting: Ophthalmology

## 2021-01-27 ENCOUNTER — Ambulatory Visit (INDEPENDENT_AMBULATORY_CARE_PROVIDER_SITE_OTHER): Payer: Medicare Other | Admitting: Ophthalmology

## 2021-01-27 ENCOUNTER — Other Ambulatory Visit: Payer: Self-pay

## 2021-01-27 DIAGNOSIS — H33322 Round hole, left eye: Secondary | ICD-10-CM | POA: Diagnosis not present

## 2021-01-27 DIAGNOSIS — H33103 Unspecified retinoschisis, bilateral: Secondary | ICD-10-CM | POA: Diagnosis not present

## 2021-01-27 DIAGNOSIS — H35033 Hypertensive retinopathy, bilateral: Secondary | ICD-10-CM

## 2021-01-27 DIAGNOSIS — I10 Essential (primary) hypertension: Secondary | ICD-10-CM

## 2021-01-27 DIAGNOSIS — H25813 Combined forms of age-related cataract, bilateral: Secondary | ICD-10-CM

## 2021-02-07 ENCOUNTER — Other Ambulatory Visit (HOSPITAL_COMMUNITY): Payer: Self-pay | Admitting: Family Medicine

## 2021-02-07 DIAGNOSIS — Z1231 Encounter for screening mammogram for malignant neoplasm of breast: Secondary | ICD-10-CM

## 2021-02-09 ENCOUNTER — Other Ambulatory Visit: Payer: Self-pay

## 2021-02-09 ENCOUNTER — Ambulatory Visit (HOSPITAL_COMMUNITY)
Admission: RE | Admit: 2021-02-09 | Discharge: 2021-02-09 | Disposition: A | Discharge status: Home / Self Care | Payer: Medicare Other | Source: Ambulatory Visit | Attending: Family Medicine | Admitting: Family Medicine

## 2021-02-09 DIAGNOSIS — Z1231 Encounter for screening mammogram for malignant neoplasm of breast: Secondary | ICD-10-CM | POA: Insufficient documentation

## 2021-03-02 DIAGNOSIS — M79622 Pain in left upper arm: Secondary | ICD-10-CM | POA: Insufficient documentation

## 2021-03-02 DIAGNOSIS — M79621 Pain in right upper arm: Secondary | ICD-10-CM | POA: Insufficient documentation

## 2021-03-20 DIAGNOSIS — F419 Anxiety disorder, unspecified: Secondary | ICD-10-CM | POA: Insufficient documentation

## 2021-03-23 ENCOUNTER — Encounter: Payer: Self-pay | Admitting: Neurology

## 2021-03-23 ENCOUNTER — Ambulatory Visit: Payer: Medicare Other | Admitting: Neurology

## 2021-03-23 VITALS — BP 157/79 | HR 63 | Ht 64.0 in | Wt 201.0 lb

## 2021-03-23 DIAGNOSIS — M791 Myalgia, unspecified site: Secondary | ICD-10-CM | POA: Diagnosis not present

## 2021-03-23 DIAGNOSIS — R531 Weakness: Secondary | ICD-10-CM | POA: Insufficient documentation

## 2021-03-23 HISTORY — DX: Weakness: R53.1

## 2021-03-23 MED ORDER — DULOXETINE HCL 30 MG PO CPEP: 30.0000 mg | ORAL_CAPSULE | Freq: Every day | ORAL | 6 refills | Status: AC

## 2021-03-23 NOTE — Progress Notes (Signed)
? ?Chief Complaint  ?Patient presents with  ? New Patient (Initial Visit)  ?  Rm 7. Accompanied by sister. ?NP/Paper Proficient/Zack [place] Crane office/ Cecile Sheerer NP/neck pain. ?Pt c/o pain and weakness in bilateral arms.  ? ? ? ? ?ASSESSMENT AND PLAN ? ?MADHURI VACCA is a 71 y.o. female   ?More than 6 months history of persistent bilateral upper extremity deep muscle achy pain, weakness, ? On examination, she has mild eye closure, cheek puff weakness, mild neck flexion, proximal symmetric upper extremity and lower extremity muscle weakness, no sensory loss, ? Differentiation diagnosis include polymyalgia rheumatica, versus intrinsic muscle disease ? Laboratory evaluations ? Cymbalta 30 mg for symptomatic treatment ? ?DIAGNOSTIC DATA (LABS, IMAGING, TESTING) ?- I reviewed patient records, labs, notes, testing and imaging myself where available. ?Laboratory evaluations in November 2022: CPK 25, MB index less than 1, negative troponin, folic acid 7.2, U72 within normal limit, ESR of 2, CBC was normal hemoglobin 15.3, CMP was normal creatinine of 0.8, ?Lipid panel LDL 132, cholesterol 218, ? ? ?MEDICAL HISTORY: ? ?LUZMARIA DEVAUX is a 71 year old female, seen in request by her primary care doctor Celene Squibb, for evaluation of bilateral upper extremity weakness, she is accompanied by her sister at today's visit March 23, 2021 ? ?I reviewed and summarized the referring note. PMhx ?HTN ?Jerrye Bushy ?Chronic insomnia,  ? 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 ?Symptoms seems to started in summer 2022, she woke up 1 night felt muscle cramping at left biceps region, she has to stretch improved to alleviate discomfort, few nights later, she had a similar involvement of the right arm, ever since then, she had persistent deep achy discomfort subjective weakness of bilateral arm, ? ?She denies  sensory loss, denies significant neck pain, denies bowel and bladder incontinence, she has mildly gait difficulty she contributed to her knee pain, did not notice any new onset lower extremity weakness ? ? ? ?PHYSICAL EXAM: ?  ?Vitals:  ? 03/23/21 1012  ?BP: (!) 157/79  ?Pulse: 63  ?Weight: 201 lb (91.2 kg)  ?Height: _0  (1.626 m)  ? ?Not recorded ?  ? ? ?Body mass index is 34.5 kg/m?. ? ?PHYSICAL EXAMNIATION: ? ?Gen: NAD, conversant, well nourised, well groomed                     ?Cardiovascular: Regular rate rhythm, no peripheral edema, warm, nontender. ?Eyes: Conjunctivae clear without exudates or hemorrhage ?Neck: Supple, no carotid bruits. ?Pulmonary: Clear to auscultation bilaterally  ? ?NEUROLOGICAL EXAM: ? ?MENTAL STATUS: ?Speech: ?   Speech is normal; fluent and spontaneous with normal comprehension.  ?Cognition: ?    Orientation to time, place and person ?    Normal recent and remote memory ?    Normal Attention span and concentration ?    Normal Language, naming, repeating,spontaneous speech ?    Fund of knowledge ?  ?CRANIAL NERVES: ?CN II: Visual fields are full to confrontation. Pupils are round equal and briskly reactive to light. ?CN III, IV, VI: extraocular movement are normal. No ptosis. ?CN V: Facial sensation is intact to light touch ?CN VII: She has mild eye closure, cheek puff weakness ?CN VIII: Hearing is normal to causal conversation. ?CN IX, X: Phonation is normal. ?CN XI: Head turning and shoulder shrug are intact ? ?MOTOR: She has a lot  of loose skin, mild neck flexion, shoulder abduction, external rotation, elbow flexion, extension weakness, also mild bilateral hip flexion weakness ? ?REFLEXES: ?Reflexes are 2 and symmetric at the biceps, triceps, knees, and ankles. Plantar responses are flexor. ? ?SENSORY: ?Intact to light touch, pinprick and vibratory sensation are intact in fingers and toes. ? ?COORDINATION: ?There is no trunk or limb dysmetria noted. ? ?GAIT/STANCE: She can get up  from seated position arm crossed, steady ? ?REVIEW OF SYSTEMS:  ?Full 14 system review of systems performed and notable only for as above ?All other review of systems were negative. ? ? ?ALLERGIES: ?No Known Allergies ? ?HOME MEDICATIONS: ?Current Outpatient Medications  ?Medication Sig Dispense Refill  ? ALPRAZolam (XANAX) 0.5 MG tablet Take 0.5 mg by mouth at bedtime.    ? gabapentin (NEURONTIN) 100 MG capsule Take 100 mg by mouth 3 (three) times daily.    ? guaiFENesin (MUCINEX) 600 MG 12 hr tablet Take 600 mg by mouth in the morning.    ? levocetirizine (XYZAL) 5 MG tablet Take 5 mg by mouth at bedtime.    ? LINZESS 72 MCG capsule TAKE ONE CAPSULE BY MOUTH ONCE DAILY WITH BREAKFAST. 30 capsule 5  ? lisinopril (PRINIVIL,ZESTRIL) 10 MG tablet Take 10 mg by mouth in the morning.    ? meloxicam (MOBIC) 15 MG tablet Take 15 mg by mouth 2 (two) times a week.    ? Omega-3 Fatty Acids (FISH OIL) 1200 MG CAPS Take 1,200 mg by mouth in the morning.    ? pantoprazole (PROTONIX) 40 MG tablet TAKE ONE TABLET BY MOUTH ONCE DAILY BEFORE BREAKFAST. 30 tablet 3  ? vitamin B-12 (CYANOCOBALAMIN) 1000 MCG tablet Take 1,000 mcg by mouth in the morning.    ? ?No current facility-administered medications for this visit.  ? ? ?PAST MEDICAL HISTORY: ?Past Medical History:  ?Diagnosis Date  ? Anxiety   ? Arthritis   ? knees  ? Constipation   ? Depression   ? GERD (gastroesophageal reflux disease)   ? Hyperlipidemia 06/12/2020  ? Hypertension   ? ? ?PAST SURGICAL HISTORY: ?Past Surgical History:  ?Procedure Laterality Date  ? COLON SURGERY  2006  ? perforated sigmoid diverticulitis, Dr Arnoldo Morale  ? COLONOSCOPY  2006  ? Dr. Gala Romney: normal rectum and a few sigmoid diverticula, 7 mm angry pedunculated polyp at 30 cm, 4 mm sessile polyp base of cecum. Unknown path   ? COLONOSCOPY WITH PROPOFOL N/A 03/04/2017  ? Procedure: COLONOSCOPY WITH PROPOFOL;  Surgeon: Daneil Dolin, MD;  [facility];  Service: Endoscopy;  Laterality: N/A;   7:30am  ? COLONOSCOPY WITH PROPOFOL N/A 04/07/2020  ? Procedure: COLONOSCOPY WITH PROPOFOL;  Surgeon: Daneil Dolin, MD;  [facility];  Service: Endoscopy;  Laterality: N/A;  AM (wants early as possible due to nausea)  ? ESOPHAGOGASTRODUODENOSCOPY (EGD) WITH PROPOFOL N/A 03/19/2019  ? Rourk: Gastric polyps (biopsy showed fundic gland), small hiatal hernia.  ? POLYPECTOMY  03/04/2017  ? Procedure: POLYPECTOMY;  Surgeon: Daneil Dolin, MD;  [facility];  Service: Endoscopy;;  polyp at cecum and left colon  ? POLYPECTOMY  03/19/2019  ? Procedure: POLYPECTOMY;  Surgeon: Daneil Dolin, MD;  [facility];  Service: Endoscopy;;  gastric  ? POLYPECTOMY  04/07/2020  ? Procedure: POLYPECTOMY;  Surgeon: Daneil Dolin, MD;  [facility];  Service: Endoscopy;;  ? ? ?FAMILY HISTORY: ?Family History  ?Problem Relation Age of Onset  ? Arthritis  Mother   ? Depression Mother   ? Diabetes Mother   ? Heart disease Mother   ? Hypertension Mother   ? Stroke Mother   ? Kidney disease Mother 38  ?     dialysis  ? Alcohol abuse Father   ? Arthritis Father   ? Cancer Father 74  ?     brain  ? Hypertension Father   ? Hypertension Sister   ? Arthritis Sister   ? Hypertension Brother   ? Arthritis Brother   ? Hypertension Brother   ? Arthritis Brother   ? Hypertension Brother   ? Hypertension Brother   ? Hypertension Brother   ? Hypertension Brother   ? Other Brother   ?     MVA  ? Colon cancer Neg Hx   ? ? ?SOCIAL HISTORY: ?Social History  ? ?Socioeconomic History  ? Marital status: Widowed  ?  Spouse name: Not on file  ? Number of children: 2  ? Years of education: 10  ? Highest education level: Not on file  ?Occupational History  ? Occupation: Emergency planning/management officer  ?  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 Smoking status: Never  ? Smokeless tobacco: Never  ?Vaping Use  ? Vaping Use: Never used  ?Substance and Sexual Activity  ? Alcohol use: No  ? Drug use: No  ? Sexual activity:  Not Currently  ?  Birth control/protection: Post-menopausal  ?Other Topics Concern  ? Not on file  ?Social History Narrative  ? Lives alone  ? Widow  ? 2 grown daughters, 5 grandchildren.  ? Hair dresser in [employer]

## 2021-03-28 ENCOUNTER — Telehealth: Payer: Self-pay | Admitting: Neurology

## 2021-03-28 DIAGNOSIS — M353 Polymyalgia rheumatica: Secondary | ICD-10-CM | POA: Insufficient documentation

## 2021-03-28 LAB — RPR: RPR Ser Ql: NONREACTIVE

## 2021-03-28 LAB — C-REACTIVE PROTEIN: CRP: 2 mg/L (ref 0–10)

## 2021-03-28 LAB — MULTIPLE MYELOMA PANEL, SERUM
Albumin SerPl Elph-Mcnc: 4 g/dL (ref 2.9–4.4)
Albumin/Glob SerPl: 1.5 (ref 0.7–1.7)
Alpha 1: 0.2 g/dL (ref 0.0–0.4)
Alpha2 Glob SerPl Elph-Mcnc: 0.6 g/dL (ref 0.4–1.0)
B-Globulin SerPl Elph-Mcnc: 0.8 g/dL (ref 0.7–1.3)
Gamma Glob SerPl Elph-Mcnc: 1.1 g/dL (ref 0.4–1.8)
Globulin, Total: 2.8 g/dL (ref 2.2–3.9)
IgA/Immunoglobulin A, Serum: 178 mg/dL (ref 87–352)
IgG (Immunoglobin G), Serum: 996 mg/dL (ref 586–1602)
IgM (Immunoglobulin M), Srm: 101 mg/dL (ref 26–217)
Total Protein: 6.8 g/dL (ref 6.0–8.5)

## 2021-03-28 LAB — THYROID PANEL WITH TSH
Free Thyroxine Index: 2.4 (ref 1.2–4.9)
T3 Uptake Ratio: 26 % (ref 24–39)
T4, Total: 9.1 ug/dL (ref 4.5–12.0)
TSH: 0.932 u[IU]/mL (ref 0.450–4.500)

## 2021-03-28 LAB — SEDIMENTATION RATE: Sed Rate: 2 mm/hr (ref 0–40)

## 2021-03-28 LAB — CK: Total CK: 39 U/L (ref 32–182)

## 2021-03-28 LAB — ANA W/REFLEX IF POSITIVE: Anti Nuclear Antibody (ANA): NEGATIVE

## 2021-03-28 LAB — ACETYLCHOLINE RECEPTOR, BINDING: AChR Binding Ab, Serum: <0.03 nmol/L (ref 0.00–0.24)

## 2021-03-28 LAB — VITAMIN D 25 HYDROXY (VIT D DEFICIENCY, FRACTURES): Vit D, 25-Hydroxy: 52.7 ng/mL (ref 30.0–100.0)

## 2021-03-28 LAB — HGB A1C W/O EAG: Hgb A1c MFr Bld: 5.1 % (ref 4.8–5.6)

## 2021-03-28 NOTE — Telephone Encounter (Signed)
I called patient. I discussed her lab results with her. She will keep her follow up with Dr. Nevada Crane and with Janett Billow, NP as scheduled. She understands that repeat labs may be considered in 4-6 months.Pt verbalized understanding of results. Pt had no questions at this time but was encouraged to call back if questions arise. ? ?

## 2021-03-28 NOTE — Telephone Encounter (Signed)
Call patient, extensive laboratory evaluation showed expansion of IgA (1 kind of immunoglobin), ? ?Immunofixation shows IgA monoclonal protein with kappa light chain specificity, ? ?I have forwarded results to her primary care doctor Celene Squibb, MD  ? ?May consider repeat laboratory evaluation in 4 to 6 months ? ?Rest of the laboratory evaluation showed no significant abnormalities. ?

## 2021-04-05 NOTE — Progress Notes (Addendum)
?[facility] Note ? ?04/07/2021 ? ?  ? ?CHIEF COMPLAINT ?Patient presents for Retina Follow Up ? ? ?HISTORY OF PRESENT ILLNESS: ?Alexandra Hoffman is a 71 y.o. female who presents to the clinic today for:  ? ?HPI   ? ? Retina Follow Up   ?Patient presents with  Other.  In both eyes.  Duration of 10 weeks.  Since onset it is gradually improving.  I, the attending physician,  performed the HPI with the patient and updated documentation appropriately. ? ?  ?  ? ? 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   ? ?  ?  ?Last edited by [name], MD on 04/07/2021  1:33 PM.  ?  ? ?Referring physician: ?Madelin Headings, DO ?[address]  ?[place], [place] [id number] ? ?HISTORICAL INFORMATION:  ? ?Selected notes from the [place] ?Referred by Dr. Jorja Loa ?LEE: 01/09/2021 ?Ocular Hx- Cataracts, DES OU, RD OS ?  ? ?CURRENT MEDICATIONS: ?No current outpatient medications on file. (Ophthalmic Drugs)  ? ?No current facility-administered medications for this visit. (Ophthalmic Drugs)  ? ?Current Outpatient Medications (Other)  ?Medication Sig  ? ALPRAZolam (XANAX) 0.5 MG tablet Take 0.5 mg by mouth at bedtime.  ? DULoxetine (CYMBALTA) 30 MG capsule Take 1 capsule (30 mg total) by mouth daily.  ? gabapentin (NEURONTIN) 100 MG capsule Take 100 mg by mouth 3 (three) times daily.  ? guaiFENesin (MUCINEX) 600 MG 12 hr tablet Take 600 mg by mouth in the morning.  ? levocetirizine (XYZAL) 5 MG tablet Take 5 mg by mouth at bedtime.  ? LINZESS 72 MCG capsule TAKE ONE CAPSULE BY MOUTH ONCE DAILY WITH BREAKFAST.  ? lisinopril (PRINIVIL,ZESTRIL) 10 MG tablet Take 10 mg by mouth in the morning.  ? meloxicam (MOBIC) 15 MG tablet Take 15 mg by mouth 2 (two) times a week.  ? Omega-3 Fatty Acids (FISH OIL) 1200 MG CAPS Take 1,200 mg by mouth in the morning.  ? pantoprazole (PROTONIX) 40 MG tablet TAKE ONE TABLET BY MOUTH ONCE DAILY BEFORE BREAKFAST.  ?  vitamin B-12 (CYANOCOBALAMIN) 1000 MCG tablet Take 1,000 mcg by mouth in the morning.  ? ?No current facility-administered medications for this visit. (Other)  ? ?REVIEW OF SYSTEMS: ?ROS   ?Positive for: Gastrointestinal, Musculoskeletal, Eyes, Psychiatric ?Negative for: Constitutional, Neurological, Skin, Genitourinary, HENT, Endocrine, Cardiovascular, Respiratory, Allergic/Imm, Heme/Lymph ?Last edited by Leonie Douglas, COA on 04/07/2021  9:00 AM.  ?  ? ?ALLERGIES ?No Known Allergies ? ?PAST MEDICAL HISTORY ?Past Medical History:  ?Diagnosis Date  ? Anxiety   ? Arthritis   ? knees  ? Constipation   ? Depression   ? GERD (gastroesophageal reflux disease)   ? Hyperlipidemia 06/12/2020  ? Hypertension   ? ?Past Surgical History:  ?Procedure Laterality Date  ? COLON SURGERY  2006  ? perforated sigmoid diverticulitis, Dr Arnoldo Morale  ? COLONOSCOPY  2006  ? Dr. Gala Romney: normal rectum and a few sigmoid diverticula, 7 mm angry pedunculated polyp at 30 cm, 4 mm sessile polyp base of cecum. Unknown path   ? COLONOSCOPY WITH PROPOFOL N/A 03/04/2017  ? Procedure: COLONOSCOPY WITH PROPOFOL;  Surgeon: Daneil Dolin, MD;  [facility];  Service: Endoscopy;  Laterality: N/A;  7:30am  ? COLONOSCOPY WITH PROPOFOL N/A 04/07/2020  ? Procedure: COLONOSCOPY WITH PROPOFOL;  Surgeon: Daneil Dolin, MD;  [facility];  Service: Endoscopy;  Laterality: N/A;  AM (wants early as possible due to nausea)  ? ESOPHAGOGASTRODUODENOSCOPY (EGD) WITH PROPOFOL N/A 03/19/2019  ? Rourk: Gastric polyps (biopsy showed fundic gland), small hiatal hernia.  ? POLYPECTOMY  03/04/2017  ? Procedure: POLYPECTOMY;  Surgeon: Daneil Dolin, MD;  [facility];  Service: Endoscopy;;  polyp at cecum and left colon  ? POLYPECTOMY  03/19/2019  ? Procedure: POLYPECTOMY;  Surgeon: Daneil Dolin, MD;  [facility];  Service: Endoscopy;;  gastric  ? POLYPECTOMY  04/07/2020  ? Procedure: POLYPECTOMY;  Surgeon: Daneil Dolin, MD;  [facility];  Service: Endoscopy;;  ? ?FAMILY HISTORY ?Family History  ?Problem Relation Age of Onset  ? Arthritis Mother   ? Depression Mother   ? Diabetes Mother   ? Heart disease Mother   ? Hypertension Mother   ? Stroke Mother   ? Kidney disease Mother 62  ?     dialysis  ? Alcohol abuse Father   ? Arthritis Father   ? Cancer Father 86  ?     brain  ? Hypertension Father   ? Hypertension Sister   ? Arthritis Sister   ? Hypertension Brother   ? Arthritis Brother   ? Hypertension Brother   ? Arthritis Brother   ? Hypertension Brother   ? Hypertension Brother   ? Hypertension Brother   ? Hypertension Brother   ? Other Brother   ?     MVA  ? Colon cancer Neg Hx   ? ?SOCIAL HISTORY ?Social History  ? ?Tobacco Use  ? Smoking status: Never  ? Smokeless tobacco: Never  ?Vaping Use  ? Vaping Use: Never used  ?Substance Use Topics  ? Alcohol use: No  ? Drug use: No  ?  ? ?  ?OPHTHALMIC EXAM: ? ?Base Eye Exam   ? ? Visual Acuity (Snellen - Linear)   ? ?   Right Left  ? Dist cc 20/30 20/20-  ? Dist ph cc 20/25   ? ? Correction: Glasses  ? ?  ?  ? ? Tonometry (Tonopen, 9:06 AM)   ? ?   Right Left  ? Pressure 20 16  ? ?  ?  ? ? Pupils   ? ?   Dark Light Shape React APD  ? Right 3 2 Round Brisk None  ? Left 3 2 Round Brisk None  ? ?  ?  ? ? Visual Fields (Counting fingers)   ? ?   Left Right  ?  Full Full  ? ?  ?  ? ? Extraocular Movement   ? ?   Right Left  ?  Full Full  ? ?  ?  ? ? Neuro/Psych   ? ? Oriented x3: Yes  ? Mood/Affect: Normal  ? ?  ?  ? ? Dilation   ? ? Both eyes: 1.0% Mydriacyl, 2.5% Phenylephrine @ 9:06 AM  ? ?  ?  ? ?  ? ?Slit Lamp and Fundus Exam   ? ? Slit Lamp Exam   ? ?   Right Left  ? Lids/Lashes Dermatochalasis - upper lid Dermatochalasis - upper lid  ? Conjunctiva/Sclera small nasal and temporal pinguecula small temporal pinguecula  ? Cornea arcus, 1+ fine Punctate epithelial erosions, tear film debris arcus, 1+ Punctate epithelial erosions, tear film debris  ? Anterior Chamber Deep and quiet Deep  and quiet  ? Iris Round and dilated Round and dilated  ? Lens 2+  Nuclear sclerosis, 2+ Cortical cataract 2+ Nuclear sclerosis, 2+ Cortical cataract  ? Anterior Vitreous Mild Vitreous syneresis Mild Vitreous syneresis  ? ?  ?  ? ? Fundus Exam   ? ?   Right Left  ? Disc Pink and Sharp Pink and Sharp, mild tilt  ? C/D Ratio 0.5 0.3  ? Macula Flat, good foveal reflex, RPE mottling, No heme, No edema Flat, good foveal reflex, RPE mottling, No heme or edema  ? Vessels attenuated, mild tortuousity, mild Copper wiring attenuated, mild tortuousity, mild Copper wiring, mild AV crossing changes  ? Periphery Attached, focal schisis cavity at 0730 -- very tiny, no outer retinal hole, focal retinal tear at 1100 with +heme -- focal RD, mild reticular degeneration, pigmented cystoid degeneration Attached, bullous schisis cavity from 0100-0200 periphery, shallow schisis cavity IT periphery w/ peripheral outer retinal hole -- good laser changes surrounding, reticular degeneration, no RT/RD/or lattice  ? ?  ?  ? ?  ? ?Refraction   ? ? Wearing Rx   ? ?   Sphere Cylinder Axis Add  ? Right +1.25 +0.25 060 +2.25  ? Left +1.25 Sphere  +2.25  ? ?  ?  ? ?  ? ?IMAGING AND PROCEDURES  ?Imaging and Procedures for 04/07/2021 ? ?OCT, Retina - OU - Both Eyes   ? ?   ?Right Eye ?Quality was good. Central Foveal Thickness: 249. Progression has worsened. Findings include normal foveal contour, no IRF, vitreomacular adhesion (Shallow SRF ST periphery caught on widefield).  ? ?Left Eye ?Quality was good. Central Foveal Thickness: 260. Progression has been stable. Findings include normal foveal contour, no SRF, vitreomacular adhesion , intraretinal fluid (Bullous retinoschisis ST and IT periphery caught on widefield ).  ? ?Notes ?*Images captured and stored on drive ? ?Diagnosis / Impression:  ?OD: NFP; no IRF/SRF centrally; shallow SRF ST periphery caught on widefield ?OS: Bullous retinoschisis ST and IT periphery caught on widefield  ? ?Clinical  management:  ?See below ? ?Abbreviations: NFP - Normal foveal profile. CME - cystoid macular edema. PED - pigment epithelial detachment. IRF - intraretinal fluid. SRF - subretinal fluid. EZ - ellipsoid zone. ERM -

## 2021-04-07 ENCOUNTER — Ambulatory Visit (INDEPENDENT_AMBULATORY_CARE_PROVIDER_SITE_OTHER): Payer: Medicare Other | Admitting: Ophthalmology

## 2021-04-07 ENCOUNTER — Encounter (INDEPENDENT_AMBULATORY_CARE_PROVIDER_SITE_OTHER): Payer: Self-pay | Admitting: Ophthalmology

## 2021-04-07 DIAGNOSIS — H33323 Round hole, bilateral: Secondary | ICD-10-CM

## 2021-04-07 DIAGNOSIS — H35033 Hypertensive retinopathy, bilateral: Secondary | ICD-10-CM

## 2021-04-07 DIAGNOSIS — I10 Essential (primary) hypertension: Secondary | ICD-10-CM

## 2021-04-07 DIAGNOSIS — H3321 Serous retinal detachment, right eye: Secondary | ICD-10-CM | POA: Diagnosis not present

## 2021-04-07 DIAGNOSIS — H33103 Unspecified retinoschisis, bilateral: Secondary | ICD-10-CM

## 2021-04-07 DIAGNOSIS — H33322 Round hole, left eye: Secondary | ICD-10-CM

## 2021-04-07 DIAGNOSIS — H25813 Combined forms of age-related cataract, bilateral: Secondary | ICD-10-CM

## 2021-04-10 DIAGNOSIS — J302 Other seasonal allergic rhinitis: Secondary | ICD-10-CM | POA: Insufficient documentation

## 2021-04-10 NOTE — Progress Notes (Signed)
?[facility] Note ? ?04/12/2021 ? ?  ? ?CHIEF COMPLAINT ?Patient presents for Retina Follow Up ? ? ?HISTORY OF PRESENT ILLNESS: ?Alexandra Hoffman is a 71 y.o. female who presents to the clinic today for:  ? ?HPI   ? ? Retina Follow Up   ?Patient presents with  Other.  In both eyes.  This started months ago.  Duration of 1 week.  Since onset it is gradually improving.  I, the attending physician,  performed the HPI with the patient and updated documentation appropriately. ? ?  ?  ? ? Comments   ?Patient feels that the vision is still improving. She ran out of the Lotemax before the 7 days. She was using Lotamax OD QID.  ? ?  ?  ?Last edited by [name], MD on 04/15/2021  2:58 AM.  ?  ?Pt states right eye has been fine since laser last week, but left eye has been bothering her ? ?Referring physician: ?Madelin Headings, DO ?[address]  ?[place], [place] [id number] ? ?HISTORICAL INFORMATION:  ? ?Selected notes from the [place] ?Referred by Dr. Jorja Loa ?LEE: 01/09/2021 ?Ocular Hx- Cataracts, DES OU, RD OS ?  ? ?CURRENT MEDICATIONS: ?No current outpatient medications on file. (Ophthalmic Drugs)  ? ?No current facility-administered medications for this visit. (Ophthalmic Drugs)  ? ?Current Outpatient Medications (Other)  ?Medication Sig  ? ALPRAZolam (XANAX) 0.5 MG tablet Take 0.5 mg by mouth at bedtime.  ? DULoxetine (CYMBALTA) 30 MG capsule Take 1 capsule (30 mg total) by mouth daily.  ? gabapentin (NEURONTIN) 100 MG capsule Take 100 mg by mouth 3 (three) times daily.  ? guaiFENesin (MUCINEX) 600 MG 12 hr tablet Take 600 mg by mouth in the morning.  ? levocetirizine (XYZAL) 5 MG tablet Take 5 mg by mouth at bedtime.  ? LINZESS 72 MCG capsule TAKE ONE CAPSULE BY MOUTH ONCE DAILY WITH BREAKFAST.  ? lisinopril (PRINIVIL,ZESTRIL) 10 MG tablet Take 10 mg by mouth in the morning.  ? meloxicam (MOBIC) 15 MG tablet Take 15 mg by mouth 2 (two) times a week.  ? Omega-3 Fatty Acids (FISH OIL)  1200 MG CAPS Take 1,200 mg by mouth in the morning.  ? pantoprazole (PROTONIX) 40 MG tablet TAKE ONE TABLET BY MOUTH ONCE DAILY BEFORE BREAKFAST.  ? vitamin B-12 (CYANOCOBALAMIN) 1000 MCG tablet Take 1,000 mcg by mouth in the morning.  ? ?No current facility-administered medications for this visit. (Other)  ? ?REVIEW OF SYSTEMS: ?ROS   ?Positive for: Gastrointestinal, Musculoskeletal, Eyes, Psychiatric ?Negative for: Constitutional, Neurological, Skin, Genitourinary, HENT, Endocrine, Cardiovascular, Respiratory, Allergic/Imm, Heme/Lymph ?Last edited by [name], COT on 04/12/2021  1:54 PM.  ?  ? ?ALLERGIES ?No Known Allergies ? ?PAST MEDICAL HISTORY ?Past Medical History:  ?Diagnosis Date  ? Anxiety   ? Arthritis   ? knees  ? Constipation   ? Depression   ? GERD (gastroesophageal reflux disease)   ? Hyperlipidemia 06/12/2020  ? Hypertension   ? ?Past Surgical History:  ?Procedure Laterality Date  ? COLON SURGERY  2006  ? perforated sigmoid diverticulitis, Dr Arnoldo Morale  ? COLONOSCOPY  2006  ? Dr. Gala Romney: normal rectum and a few sigmoid diverticula, 7 mm angry pedunculated polyp at 30 cm, 4 mm sessile polyp base of cecum. Unknown path   ? COLONOSCOPY WITH PROPOFOL N/A 03/04/2017  ? Procedure: COLONOSCOPY WITH PROPOFOL;  Surgeon: Daneil Dolin, MD;  [facility];  Service: Endoscopy;  Laterality: N/A;  7:30am  ? COLONOSCOPY WITH PROPOFOL N/A 04/07/2020  ? Procedure: COLONOSCOPY WITH PROPOFOL;  Surgeon: Daneil Dolin, MD;  [facility];  Service: Endoscopy;  Laterality: N/A;  AM (wants early as possible due to nausea)  ? ESOPHAGOGASTRODUODENOSCOPY (EGD) WITH PROPOFOL N/A 03/19/2019  ? Rourk: Gastric polyps (biopsy showed fundic gland), small hiatal hernia.  ? POLYPECTOMY  03/04/2017  ? Procedure: POLYPECTOMY;  Surgeon: Daneil Dolin, MD;  [facility];  Service: Endoscopy;;  polyp at cecum and left colon  ? POLYPECTOMY  03/19/2019  ? Procedure: POLYPECTOMY;  Surgeon: Daneil Dolin, MD;  [facility];  Service: Endoscopy;;  gastric  ? POLYPECTOMY  04/07/2020  ? Procedure: POLYPECTOMY;  Surgeon: Daneil Dolin, MD;  [facility];  Service: Endoscopy;;  ? ?FAMILY HISTORY ?Family History  ?Problem Relation Age of Onset  ? Arthritis Mother   ? Depression Mother   ? Diabetes Mother   ? Heart disease Mother   ? Hypertension Mother   ? Stroke Mother   ? Kidney disease Mother 77  ?     dialysis  ? Alcohol abuse Father   ? Arthritis Father   ? Cancer Father 80  ?     brain  ? Hypertension Father   ? Hypertension Sister   ? Arthritis Sister   ? Hypertension Brother   ? Arthritis Brother   ? Hypertension Brother   ? Arthritis Brother   ? Hypertension Brother   ? Hypertension Brother   ? Hypertension Brother   ? Hypertension Brother   ? Other Brother   ?     MVA  ? Colon cancer Neg Hx   ? ?SOCIAL HISTORY ?Social History  ? ?Tobacco Use  ? Smoking status: Never  ? Smokeless tobacco: Never  ?Vaping Use  ? Vaping Use: Never used  ?Substance Use Topics  ? Alcohol use: No  ? Drug use: No  ?  ? ?  ?OPHTHALMIC EXAM: ? ?Base Eye Exam   ? ? Visual Acuity (Snellen - Linear)   ? ?   Right Left  ? Dist cc 20/30 +1 20/20 -1  ? ? Correction: Glasses  ? ?  ?  ? ? Tonometry (Tonopen, 1:58 PM)   ? ?   Right Left  ? Pressure 15 16  ? ?  ?  ? ? Pupils   ? ?   Pupils Dark Light Shape React APD  ? Right PERRL 3 2 Round Brisk None  ? Left PERRL 3 2 Round Brisk None  ? ?  ?  ? ? Visual Fields   ? ?   Left Right  ?  Full Full  ? ?  ?  ? ? Extraocular Movement   ? ?   Right Left  ?  Full, Ortho Full, Ortho  ? ?  ?  ? ? Neuro/Psych   ? ? Oriented x3: Yes  ? Mood/Affect: Normal  ? ?  ?  ? ? Dilation   ? ? Both eyes: 1.0% Mydriacyl, 2.5% Phenylephrine @ 1:56 PM  ? ?  ?  ? ?  ? ?Slit Lamp and Fundus Exam   ? ? Slit Lamp Exam   ? ?   Right Left  ? Lids/Lashes Dermatochalasis - upper lid Dermatochalasis - upper lid  ? Conjunctiva/Sclera small nasal and temporal pinguecula small temporal pinguecula, no injection   ? Cornea arcus, trace Punctate epithelial erosions, tear film debris  arcus, 2+ fine Punctate epithelial erosions, tear film debris  ? Anterior Chamber Deep and quiet Deep and quiet  ? Iris Round and dilated Round and dilated  ? Lens 2+ Nuclear sclerosis, 2+ Cortical cataract 2+ Nuclear sclerosis, 2-3+ Cortical cataract  ? Anterior Vitreous Mild Vitreous syneresis Mild Vitreous syneresis  ? ?  ?  ? ? Fundus Exam   ? ?   Right Left  ? Disc Pink and Sharp Pink and Sharp, mild tilt  ? C/D Ratio 0.5 0.3  ? Macula Flat, good foveal reflex, RPE mottling, No heme, No edema Flat, good foveal reflex, RPE mottling, No heme or edema  ? Vessels attenuated, Tortuous attenuated, mild tortuousity, mild Copper wiring, mild AV crossing changes  ? Periphery Attached, focal schisis cavity at 0730 -- very tiny, no outer retinal hole, focal retinal tear at 1100 with +heme/SRF -- focal RD -- good early laser changes from 0900-1130, mild reticular degeneration, pigmented cystoid degeneration Attached, bullous schisis cavity from 0100-0200 periphery, shallow schisis cavity IT periphery w/ peripheral outer retinal hole -- good laser changes surrounding, reticular degeneration, no RT/RD/or lattice  ? ?  ?  ? ?  ? ?Refraction   ? ? Wearing Rx   ? ?   Sphere Cylinder Axis Add  ? Right +1.25 +0.25 060 +2.25  ? Left +1.25 Sphere  +2.25  ? ?  ?  ? ?  ? ?IMAGING AND PROCEDURES  ?Imaging and Procedures for 04/12/2021 ? ?OCT, Retina - OU - Both Eyes   ? ?   ?Right Eye ?Quality was good. Central Foveal Thickness: 261. Progression has been stable. Findings include normal foveal contour, no IRF, vitreomacular adhesion (Shallow SRF ST periphery caught on widefield - not imaged today).  ? ?Left Eye ?Quality was good. Central Foveal Thickness: 266. Progression has been stable. Findings include normal foveal contour, no SRF, vitreomacular adhesion , intraretinal fluid (Bullous retinoschisis ST and IT periphery caught on widefield ).  ? ?Notes ?*Images  captured and stored on drive ? ?Diagnosis / Impression:  ?OD: NFP; no IRF/SRF centrally; shallow SRF ST periphery caught on widefield--not imaged today ?OS: Bullous retinoschisis ST and IT periphery caught o

## 2021-04-12 ENCOUNTER — Ambulatory Visit (INDEPENDENT_AMBULATORY_CARE_PROVIDER_SITE_OTHER): Payer: Medicare Other | Admitting: Ophthalmology

## 2021-04-12 ENCOUNTER — Encounter (INDEPENDENT_AMBULATORY_CARE_PROVIDER_SITE_OTHER): Payer: Self-pay | Admitting: Ophthalmology

## 2021-04-12 DIAGNOSIS — H35033 Hypertensive retinopathy, bilateral: Secondary | ICD-10-CM

## 2021-04-12 DIAGNOSIS — I10 Essential (primary) hypertension: Secondary | ICD-10-CM | POA: Diagnosis not present

## 2021-04-12 DIAGNOSIS — H33323 Round hole, bilateral: Secondary | ICD-10-CM

## 2021-04-12 DIAGNOSIS — H25813 Combined forms of age-related cataract, bilateral: Secondary | ICD-10-CM

## 2021-04-12 DIAGNOSIS — H33103 Unspecified retinoschisis, bilateral: Secondary | ICD-10-CM

## 2021-04-12 DIAGNOSIS — H3321 Serous retinal detachment, right eye: Secondary | ICD-10-CM

## 2021-04-15 ENCOUNTER — Encounter (INDEPENDENT_AMBULATORY_CARE_PROVIDER_SITE_OTHER): Payer: Self-pay | Admitting: Ophthalmology

## 2021-04-20 NOTE — Progress Notes (Signed)
?[facility] Note ? ?04/21/2021 ? ?  ? ?CHIEF COMPLAINT ?Patient presents for Retina Follow Up ? ? ?HISTORY OF PRESENT ILLNESS: ?Alexandra Hoffman is a 71 y.o. female who presents to the clinic today for:  ? ?HPI   ? ? Retina Follow Up   ?Patient presents with  Other.  In left eye.  Severity is moderate.  Duration of 9 days.  Since onset it is stable.  I, the attending physician,  performed the HPI with the patient and updated documentation appropriately. ? ?  ?  ? ? Comments   ?Pt here for 9 day f/u ret hole OS. Pt states VA the same, seems stable. No issues.  ? ?  ?  ?Last edited by [name], MD on 04/22/2021  2:20 AM.  ?  ?Pt states no change in vision, no new fol or floaters ? ?Referring physician: ?Madelin Headings, DO ?[address]  ?[place], [place] [id number] ? ?HISTORICAL INFORMATION:  ? ?Selected notes from the [facility] ?Referred by Dr. Jorja Loa ?LEE: 01/09/2021 ?Ocular Hx- Cataracts, DES OU, RD OS ?  ? ?CURRENT MEDICATIONS: ?No current outpatient medications on file. (Ophthalmic Drugs)  ? ?No current facility-administered medications for this visit. (Ophthalmic Drugs)  ? ?Current Outpatient Medications (Other)  ?Medication Sig  ? ALPRAZolam (XANAX) 0.5 MG tablet Take 0.5 mg by mouth at bedtime.  ? DULoxetine (CYMBALTA) 30 MG capsule Take 1 capsule (30 mg total) by mouth daily.  ? gabapentin (NEURONTIN) 100 MG capsule Take 100 mg by mouth 3 (three) times daily.  ? guaiFENesin (MUCINEX) 600 MG 12 hr tablet Take 600 mg by mouth in the morning.  ? levocetirizine (XYZAL) 5 MG tablet Take 5 mg by mouth at bedtime.  ? LINZESS 72 MCG capsule TAKE ONE CAPSULE BY MOUTH ONCE DAILY WITH BREAKFAST.  ? lisinopril (PRINIVIL,ZESTRIL) 10 MG tablet Take 10 mg by mouth in the morning.  ? meloxicam (MOBIC) 15 MG tablet Take 15 mg by mouth 2 (two) times a week.  ? Omega-3 Fatty Acids (FISH OIL) 1200 MG CAPS Take 1,200 mg by mouth in the morning.  ? pantoprazole (PROTONIX) 40 MG tablet TAKE ONE  TABLET BY MOUTH ONCE DAILY BEFORE BREAKFAST.  ? vitamin B-12 (CYANOCOBALAMIN) 1000 MCG tablet Take 1,000 mcg by mouth in the morning.  ? ?No current facility-administered medications for this visit. (Other)  ? ?REVIEW OF SYSTEMS: ?ROS   ?Positive for: Gastrointestinal, Musculoskeletal, Eyes, Psychiatric ?Negative for: Constitutional, Neurological, Skin, Genitourinary, HENT, Endocrine, Cardiovascular, Respiratory, Allergic/Imm, Heme/Lymph ?Last edited by Kingsley Spittle, COT on 04/21/2021  8:59 AM.  ?  ? ? ?ALLERGIES ?No Known Allergies ? ?PAST MEDICAL HISTORY ?Past Medical History:  ?Diagnosis Date  ? Anxiety   ? Arthritis   ? knees  ? Constipation   ? Depression   ? GERD (gastroesophageal reflux disease)   ? Hyperlipidemia 06/12/2020  ? Hypertension   ? ?Past Surgical History:  ?Procedure Laterality Date  ? COLON SURGERY  2006  ? perforated sigmoid diverticulitis, Dr Arnoldo Morale  ? COLONOSCOPY  2006  ? Dr. Gala Romney: normal rectum and a few sigmoid diverticula, 7 mm angry pedunculated polyp at 30 cm, 4 mm sessile polyp base of cecum. Unknown path   ? COLONOSCOPY WITH PROPOFOL N/A 03/04/2017  ? Procedure: COLONOSCOPY WITH PROPOFOL;  Surgeon: Daneil Dolin, MD;  [facility];  Service: Endoscopy;  Laterality: N/A;  7:30am  ? COLONOSCOPY WITH PROPOFOL N/A 04/07/2020  ? Procedure:  COLONOSCOPY WITH PROPOFOL;  Surgeon: Daneil Dolin, MD;  [facility];  Service: Endoscopy;  Laterality: N/A;  AM (wants early as possible due to nausea)  ? ESOPHAGOGASTRODUODENOSCOPY (EGD) WITH PROPOFOL N/A 03/19/2019  ? Rourk: Gastric polyps (biopsy showed fundic gland), small hiatal hernia.  ? POLYPECTOMY  03/04/2017  ? Procedure: POLYPECTOMY;  Surgeon: Daneil Dolin, MD;  [facility];  Service: Endoscopy;;  polyp at cecum and left colon  ? POLYPECTOMY  03/19/2019  ? Procedure: POLYPECTOMY;  Surgeon: Daneil Dolin, MD;  [facility];  Service: Endoscopy;;  gastric  ? POLYPECTOMY  04/07/2020  ?  Procedure: POLYPECTOMY;  Surgeon: Daneil Dolin, MD;  [facility];  Service: Endoscopy;;  ? ?FAMILY HISTORY ?Family History  ?Problem Relation Age of Onset  ? Arthritis Mother   ? Depression Mother   ? Diabetes Mother   ? Heart disease Mother   ? Hypertension Mother   ? Stroke Mother   ? Kidney disease Mother 21  ?     dialysis  ? Alcohol abuse Father   ? Arthritis Father   ? Cancer Father 14  ?     brain  ? Hypertension Father   ? Hypertension Sister   ? Arthritis Sister   ? Hypertension Brother   ? Arthritis Brother   ? Hypertension Brother   ? Arthritis Brother   ? Hypertension Brother   ? Hypertension Brother   ? Hypertension Brother   ? Hypertension Brother   ? Other Brother   ?     MVA  ? Colon cancer Neg Hx   ? ?SOCIAL HISTORY ?Social History  ? ?Tobacco Use  ? Smoking status: Never  ? Smokeless tobacco: Never  ?Vaping Use  ? Vaping Use: Never used  ?Substance Use Topics  ? Alcohol use: No  ? Drug use: No  ?  ? ?  ?OPHTHALMIC EXAM: ? ?Base Eye Exam   ? ? Visual Acuity (Snellen - Linear)   ? ?   Right Left  ? Dist cc 20/40 +2 20/20 -2  ? Dist ph cc 20/30 +2   ? ? Correction: Glasses  ? ?  ?  ? ? Tonometry (Tonopen, 9:03 AM)   ? ?   Right Left  ? Pressure 17 15  ? ?  ?  ? ? Pupils   ? ?   Dark Light Shape React APD  ? Right 3 2 Round Brisk None  ? Left 3 2 Round Brisk None  ? ?  ?  ? ? Visual Fields (Counting fingers)   ? ?   Left Right  ?  Full Full  ? ?  ?  ? ? Extraocular Movement   ? ?   Right Left  ?  Full, Ortho Full, Ortho  ? ?  ?  ? ? Neuro/Psych   ? ? Oriented x3: Yes  ? Mood/Affect: Normal  ? ?  ?  ? ? Dilation   ? ? Both eyes: 1.0% Mydriacyl, 2.5% Phenylephrine @ 9:04 AM  ? ?  ?  ? ?  ? ?Slit Lamp and Fundus Exam   ? ? Slit Lamp Exam   ? ?   Right Left  ? Lids/Lashes Dermatochalasis - upper lid Dermatochalasis - upper lid  ? Conjunctiva/Sclera small nasal and temporal pinguecula small temporal pinguecula, no injection  ? Cornea arcus, 1-2+ fine Punctate epithelial erosions, tear film  debris arcus, 2+ fine Punctate epithelial erosions,  tear film debris  ? Anterior Chamber Deep and quiet Deep and quiet  ? Iris Round and dilated Round and dilated  ? Lens 2+ Nuclear sclerosis, 2+ Cortical cataract 2+ Nuclear sclerosis, 2-3+ Cortical cataract  ? Anterior Vitreous Mild Vitreous syneresis Mild Vitreous syneresis  ? ?  ?  ? ? Fundus Exam   ? ?   Right Left  ? Disc Pink and Sharp Pink and Sharp, mild tilt  ? C/D Ratio 0.5 0.3  ? Macula Flat, good foveal reflex, RPE mottling, No heme or edema Flat, good foveal reflex, RPE mottling, No heme or edema  ? Vessels mild attenuation, mild tortuosity attenuated, mild tortuosity, mild Copper wiring, mild AV crossing changes  ? Periphery focal retinal tear at 1100 with +heme/SRF -- focal RD -- good laser changes from 0900-1130, focal schisis cavity at 0730 -- very tiny, no outer retinal hole, mild reticular degeneration, pigmented cystoid degeneration, no new RT/RD Attached, bullous schisis cavity from 0100-0200 periphery, shallow schisis cavity IT periphery w/ peripheral outer retinal hole -- good laser changes surrounding, reticular degeneration, no new RT/RD or lattice  ? ?  ?  ? ?  ? ?Refraction   ? ? Wearing Rx   ? ?   Sphere Cylinder Axis Add  ? Right +1.25 +0.25 060 +2.25  ? Left +1.25 Sphere  +2.25  ? ?  ?  ? ?  ? ?IMAGING AND PROCEDURES  ?Imaging and Procedures for 04/21/2021 ? ?OCT, Retina - OU - Both Eyes   ? ?   ?Right Eye ?Quality was good. Central Foveal Thickness: 257. Progression has been stable. Findings include normal foveal contour, no IRF, vitreomacular adhesion , subretinal fluid (Shallow SRF ST periphery caught on widefield - not imaged today).  ? ?Left Eye ?Quality was good. Central Foveal Thickness: 261. Progression has been stable. Findings include normal foveal contour, no SRF, vitreomacular adhesion , no IRF, subretinal fluid (Bullous retinoschisis ST and IT periphery caught on widefield ).  ? ?Notes ?*Images captured and stored on  drive ? ?Diagnosis / Impression:  ?OD: NFP; no IRF/SRF centrally; shallow SRF ST periphery caught on widefield--not imaged today ?OS: Bullous retinoschisis ST and IT periphery caught on widefield  ? ?Clinical manageme

## 2021-04-21 ENCOUNTER — Ambulatory Visit (INDEPENDENT_AMBULATORY_CARE_PROVIDER_SITE_OTHER): Payer: Medicare Other | Admitting: Ophthalmology

## 2021-04-21 ENCOUNTER — Encounter (INDEPENDENT_AMBULATORY_CARE_PROVIDER_SITE_OTHER): Payer: Self-pay | Admitting: Ophthalmology

## 2021-04-21 DIAGNOSIS — H33103 Unspecified retinoschisis, bilateral: Secondary | ICD-10-CM

## 2021-04-21 DIAGNOSIS — H33323 Round hole, bilateral: Secondary | ICD-10-CM

## 2021-04-21 DIAGNOSIS — I10 Essential (primary) hypertension: Secondary | ICD-10-CM | POA: Diagnosis not present

## 2021-04-21 DIAGNOSIS — H35033 Hypertensive retinopathy, bilateral: Secondary | ICD-10-CM | POA: Diagnosis not present

## 2021-04-21 DIAGNOSIS — H3321 Serous retinal detachment, right eye: Secondary | ICD-10-CM

## 2021-04-21 DIAGNOSIS — H25813 Combined forms of age-related cataract, bilateral: Secondary | ICD-10-CM

## 2021-04-22 ENCOUNTER — Encounter (INDEPENDENT_AMBULATORY_CARE_PROVIDER_SITE_OTHER): Payer: Self-pay | Admitting: Ophthalmology

## 2021-06-05 ENCOUNTER — Ambulatory Visit (INDEPENDENT_AMBULATORY_CARE_PROVIDER_SITE_OTHER): Payer: Medicare Other | Admitting: Ophthalmology

## 2021-06-05 ENCOUNTER — Encounter (INDEPENDENT_AMBULATORY_CARE_PROVIDER_SITE_OTHER): Payer: Self-pay | Admitting: Ophthalmology

## 2021-06-05 DIAGNOSIS — H35033 Hypertensive retinopathy, bilateral: Secondary | ICD-10-CM

## 2021-06-05 DIAGNOSIS — H33323 Round hole, bilateral: Secondary | ICD-10-CM

## 2021-06-05 DIAGNOSIS — H43812 Vitreous degeneration, left eye: Secondary | ICD-10-CM | POA: Diagnosis not present

## 2021-06-05 DIAGNOSIS — H3321 Serous retinal detachment, right eye: Secondary | ICD-10-CM | POA: Diagnosis not present

## 2021-06-05 DIAGNOSIS — I10 Essential (primary) hypertension: Secondary | ICD-10-CM | POA: Diagnosis not present

## 2021-06-05 DIAGNOSIS — H33103 Unspecified retinoschisis, bilateral: Secondary | ICD-10-CM

## 2021-06-05 DIAGNOSIS — H25813 Combined forms of age-related cataract, bilateral: Secondary | ICD-10-CM

## 2021-06-05 NOTE — Progress Notes (Shared)
[facility] Note  06/05/2021     CHIEF COMPLAINT Patient presents for Retina Evaluation   HISTORY OF PRESENT ILLNESS: Alexandra Hoffman is a 71 y.o. female who presents to the clinic today for:   HPI     Retina Evaluation   In left eye.  This started 1 day ago.  Associated Symptoms Flashes and Floaters.  I, the attending physician,  performed the HPI with the patient and updated documentation appropriately.        Comments   Patient here for Retina evaluation. Patient states vision is ok. OS has a black spot that started yesterday and last night started having flashes of light. No eye pain. OD has pressure. Don't hurt. Sinus.      Last edited by [name], MD on 06/07/2021  5:08 PM.    Pt states new black spot OS that started yesterday. Last night, patient started having flashes OS.   Referring physician: Madelin Headings, DO [address]  [place], [place] [id number]  HISTORICAL INFORMATION:   Selected notes from the MEDICAL RECORD NUMBER Referred by Dr. Jorja Loa LEE: 01/09/2021 Ocular Hx- Cataracts, DES OU, RD OS    CURRENT MEDICATIONS: No current outpatient medications on file. (Ophthalmic Drugs)   No current facility-administered medications for this visit. (Ophthalmic Drugs)   Current Outpatient Medications (Other)  Medication Sig   ALPRAZolam (XANAX) 0.5 MG tablet Take 0.5 mg by mouth at bedtime.   DULoxetine (CYMBALTA) 30 MG capsule Take 1 capsule (30 mg total) by mouth daily.   gabapentin (NEURONTIN) 100 MG capsule Take 100 mg by mouth 3 (three) times daily.   guaiFENesin (MUCINEX) 600 MG 12 hr tablet Take 600 mg by mouth in the morning.   levocetirizine (XYZAL) 5 MG tablet Take 5 mg by mouth at bedtime.   LINZESS 72 MCG capsule TAKE ONE CAPSULE BY MOUTH ONCE DAILY WITH BREAKFAST.   lisinopril (PRINIVIL,ZESTRIL) 10 MG tablet Take 10 mg by mouth in the morning.   meloxicam (MOBIC) 15 MG tablet Take 15 mg by mouth 2 (two) times a week.    Omega-3 Fatty Acids (FISH OIL) 1200 MG CAPS Take 1,200 mg by mouth in the morning.   pantoprazole (PROTONIX) 40 MG tablet TAKE ONE TABLET BY MOUTH ONCE DAILY BEFORE BREAKFAST.   vitamin B-12 (CYANOCOBALAMIN) 1000 MCG tablet Take 1,000 mcg by mouth in the morning.   No current facility-administered medications for this visit. (Other)   REVIEW OF SYSTEMS: ROS   Positive for: Gastrointestinal, Musculoskeletal, Eyes, Psychiatric Negative for: Constitutional, Neurological, Skin, Genitourinary, HENT, Endocrine, Cardiovascular, Respiratory, Allergic/Imm, Heme/Lymph Last edited by Theodore Demark, COA on 06/05/2021 10:04 AM.     ALLERGIES No Known Allergies  PAST MEDICAL HISTORY Past Medical History:  Diagnosis Date   Anxiety    Arthritis    knees   Constipation    Depression    GERD (gastroesophageal reflux disease)    Hyperlipidemia 06/12/2020   Hypertension    Past Surgical History:  Procedure Laterality Date   COLON SURGERY  2006   perforated sigmoid diverticulitis, Dr Arnoldo Morale   COLONOSCOPY  2006   Dr. Gala Romney: normal rectum and a few sigmoid diverticula, 7 mm angry pedunculated polyp at 30 cm, 4 mm sessile polyp base of cecum. Unknown path    COLONOSCOPY WITH PROPOFOL N/A 03/04/2017   Procedure: COLONOSCOPY WITH PROPOFOL;  Surgeon: Daneil Dolin, MD;  [facility];  Service: Endoscopy;  Laterality: N/A;  7:30am  COLONOSCOPY WITH PROPOFOL N/A 04/07/2020   Procedure: COLONOSCOPY WITH PROPOFOL;  Surgeon: Daneil Dolin, MD;  [facility];  Service: Endoscopy;  Laterality: N/A;  AM (wants early as possible due to nausea)   ESOPHAGOGASTRODUODENOSCOPY (EGD) WITH PROPOFOL N/A 03/19/2019   Rourk: Gastric polyps (biopsy showed fundic gland), small hiatal hernia.   POLYPECTOMY  03/04/2017   Procedure: POLYPECTOMY;  Surgeon: Daneil Dolin, MD;  [facility];  Service: Endoscopy;;  polyp at cecum and left colon   POLYPECTOMY  03/19/2019   Procedure:  POLYPECTOMY;  Surgeon: Daneil Dolin, MD;  [facility];  Service: Endoscopy;;  gastric   POLYPECTOMY  04/07/2020   Procedure: POLYPECTOMY;  Surgeon: Daneil Dolin, MD;  [facility];  Service: Endoscopy;;   FAMILY HISTORY Family History  Problem Relation Age of Onset   Arthritis Mother    Depression Mother    Diabetes Mother    Heart disease Mother    Hypertension Mother    Stroke Mother    Kidney disease Mother 17       dialysis   Alcohol abuse Father    Arthritis Father    Cancer Father 41       brain   Hypertension Father    Hypertension Sister    Arthritis Sister    Hypertension Brother    Arthritis Brother    Hypertension Brother    Arthritis Brother    Hypertension Brother    Hypertension Brother    Hypertension Brother    Hypertension Brother    Other Brother        MVA   Colon cancer Neg Hx    SOCIAL HISTORY Social History   Tobacco Use   Smoking status: Never   Smokeless tobacco: Never  Vaping Use   Vaping Use: Never used  Substance Use Topics   Alcohol use: No   Drug use: No       OPHTHALMIC EXAM:  Base Eye Exam     Visual Acuity (Snellen - Linear)       Right Left   Dist cc 20/30 20/20   Dist ph cc 20/25     Correction: Glasses         Tonometry (Tonopen, 10:01 AM)       Right Left   Pressure 17 14         Pupils       Dark Light Shape React APD   Right 3 2 Round Brisk None   Left 3 2 Round Brisk None         Visual Fields (Counting fingers)       Left Right    Full Full         Extraocular Movement       Right Left    Full, Ortho Full, Ortho         Neuro/Psych     Oriented x3: Yes   Mood/Affect: Normal         Dilation     Both eyes: 1.0% Mydriacyl, 2.5% Phenylephrine @ 10:01 AM           Slit Lamp and Fundus Exam     Slit Lamp Exam       Right Left   Lids/Lashes Dermatochalasis - upper lid Dermatochalasis - upper lid   Conjunctiva/Sclera small nasal and  temporal pinguecula small temporal pinguecula, no injection   Cornea arcus, 1+ fine Punctate epithelial erosions, tear film debris arcus,1-  2+ fine Punctate epithelial erosions, tear film debris   Anterior Chamber Deep and quiet Deep and quiet   Iris Round and dilated Round and dilated   Lens 2+ Nuclear sclerosis, 2+ Cortical cataract 2+ Nuclear sclerosis, 2-3+ Cortical cataract   Anterior Vitreous Mild Vitreous syneresis Mild Vitreous syneresis, PVD , Weiss ring         Fundus Exam       Right Left   Disc Pink and Sharp Pink and Sharp, mild tilt   C/D Ratio 0.5 0.3   Macula Flat, good foveal reflex, RPE mottling, No heme or edema Flat, good foveal reflex, RPE mottling, No heme or edema   Vessels mild attenuation, mild tortuosity attenuated, mild tortuosity, mild Copper wiring, mild AV crossing changes   Periphery focal retinal tear at 1100 with +heme/SRF -- focal RD -- good laser changes from 0900-1130, focal schisis cavity at 0730 -- very tiny, no outer retinal hole, mild reticular degeneration, pigmented cystoid degeneration, no new RT/RD Attached, bullous schisis cavity from 0100-0200 periphery, shallow schisis cavity IT periphery w/ peripheral outer retinal hole -- good laser changes surrounding, reticular degeneration, no new RT/RD or lattice           Refraction     Wearing Rx       Sphere Cylinder Axis Add   Right +1.25 +0.25 060 +2.25   Left +1.25 Sphere  +2.25           IMAGING AND PROCEDURES  Imaging and Procedures for 06/05/2021  OCT, Retina - OU - Both Eyes       Right Eye Quality was good. Central Foveal Thickness: 255. Progression has been stable. Findings include normal foveal contour, no IRF, vitreomacular adhesion , subretinal fluid (Shallow SRF ST periphery caught on widefield - not imaged today).   Left Eye Quality was good. Central Foveal Thickness: 265. Progression has been stable. Findings include normal foveal contour, no SRF, vitreomacular  adhesion , no IRF, subretinal fluid (Interval release of VMA to full PVD. Mild vitreuos opacities. Bullous retinoschisis ST and IT periphery caught on widefield--not imaged today.).   Notes *Images captured and stored on drive  Diagnosis / Impression:  OD: NFP; no IRF/SRF centrally; shallow SRF ST periphery caught on widefield--not imaged today OS: Interval release of VMA to full PVD. Mild vitreuos opacities. Bullous retinoschisis ST and IT periphery caught on widefield--not imaged today.  Clinical management:  See below  Abbreviations: NFP - Normal foveal profile. CME - cystoid macular edema. PED - pigment epithelial detachment. IRF - intraretinal fluid. SRF - subretinal fluid. EZ - ellipsoid zone. ERM - epiretinal membrane. ORA - outer retinal atrophy. ORT - outer retinal tubulation. SRHM - subretinal hyper-reflective material. IRHM - intraretinal hyper-reflective material             ASSESSMENT/PLAN:    ICD-10-CM   1. Posterior vitreous detachment of left eye  H43.812 OCT, Retina - OU - Both Eyes    2. Bilateral retinoschisis  H33.103 OCT, Retina - OU - Both Eyes    3. Retinal hole of both eyes  H33.323     4. Right retinal detachment  H33.21     5. Essential hypertension  I10     6. Hypertensive retinopathy of both eyes  H35.033     7. Combined forms of age-related cataract of both eyes  H25.813      1. PVD / vitreous syneresis OS  - acute symptomatic flashes / floaters OS -- onset 1 day  ago, 06.04.23  - Discussed findings and prognosis  - No RT or RD on 360 scleral depressed exam  - Reviewed s/s of RT/RD  - Strict return precautions for any such RT/RD signs/symptoms  - f/u in 6 weeks as scheduled  2,3. Retinoschisis OU  - OD w/ shallow schisis cavity IT periphery  - OS w/ bullous retinoschisis ST periphery (0100-0200) and shallow schisis IT periphery w/ peripheral outer retinal hole  - asymptomatic, BCVA 20/25 OD, 20/20 OS  - OS lesion found during routine exam  by Dr. Jorja Loa  - s/p laser retinopexy OS (01.13.23) to patches of schisis -- good laser changes in place surrounding schisis  - no treatment recommended for shallow schisis OD at this time -- monitor  4,5. Retinal hole w/ focal retinal detachment OD  - new retinal hole at 11 w/ mild heme and surrounding shallow SRF / RD from 09-1128 noted 4.7.23  - asymptomatic  - s/p laser retinopexy OD 04.07.23 -- good laser changes surrounding - f/u 2-3 months, DFE, OCT  6,7. Hypertensive retinopathy OU - discussed importance of tight BP control - monitor   8. Mixed Cataract OU - The symptoms of cataract, surgical options, and treatments and risks were discussed with patient. - discussed diagnosis and progression - not yet visually significant - monitor for now  Ophthalmic Meds Ordered this visit:  No orders of the defined types were placed in this encounter.    Return as scheduled, for DFE, OCT.  There are no Patient Instructions on file for this visit.   Explained the diagnoses, plan, and follow up with the patient and they expressed understanding.  Patient expressed understanding of the importance of proper follow up care.   This document serves as a record of services personally performed by Gardiner Sleeper, MD, PhD. It was created on their behalf by San Jetty. Owens Shark, OA an ophthalmic technician. The creation of this record is the provider's dictation and/or activities during the visit.    Electronically signed by: San Jetty. Owens Shark, [place] 06.05.2023 5:13 PM  This document serves as a record of services personally performed by Gardiner Sleeper, MD, PhD. It was created on their behalf by Roselee Nova, COMT. The creation of this record is the provider's dictation and/or activities during the visit.  Electronically signed by: Roselee Nova, COMT 06/07/21 5:13 PM  Gardiner Sleeper, M.D., Ph.D. Diseases & Surgery of the Retina and Vitreous [facility]  I have reviewed the  above documentation for accuracy and completeness, and I agree with the above. Gardiner Sleeper, M.D., Ph.D. 06/07/21 5:13 PM  Abbreviations: M myopia (nearsighted); A astigmatism; H hyperopia (farsighted); P presbyopia; Mrx spectacle prescription;  CTL contact lenses; OD right eye; OS left eye; OU both eyes  XT exotropia; ET esotropia; PEK punctate epithelial keratitis; PEE punctate epithelial erosions; DES dry eye syndrome; MGD meibomian gland dysfunction; ATs artificial tears; PFAT's preservative free artificial tears; [place] nuclear sclerotic cataract; PSC posterior subcapsular cataract; ERM epi-retinal membrane; PVD posterior vitreous detachment; RD retinal detachment; DM diabetes mellitus; DR diabetic retinopathy; NPDR non-proliferative diabetic retinopathy; PDR proliferative diabetic retinopathy; CSME clinically significant macular edema; DME diabetic macular edema; dbh dot blot hemorrhages; CWS cotton wool spot; POAG primary open angle glaucoma; C/D cup-to-disc ratio; HVF humphrey visual field; GVF goldmann visual field; OCT optical coherence tomography; IOP intraocular pressure; BRVO Branch retinal vein occlusion; CRVO central retinal vein occlusion; CRAO central retinal artery occlusion; BRAO branch retinal artery occlusion; RT retinal  tear; SB scleral buckle; PPV pars plana vitrectomy; VH Vitreous hemorrhage; PRP panretinal laser photocoagulation; IVK intravitreal kenalog; VMT vitreomacular traction; MH Macular hole;  NVD neovascularization of the disc; NVE neovascularization elsewhere; AREDS age related eye disease study; ARMD age related macular degeneration; POAG primary open angle glaucoma; EBMD epithelial/anterior basement membrane dystrophy; ACIOL anterior chamber intraocular lens; IOL intraocular lens; PCIOL posterior chamber intraocular lens; Phaco/IOL phacoemulsification with intraocular lens placement; [place] photorefractive keratectomy; LASIK laser assisted in situ keratomileusis; HTN hypertension;  DM diabetes mellitus; COPD chronic obstructive pulmonary disease

## 2021-06-07 ENCOUNTER — Encounter (INDEPENDENT_AMBULATORY_CARE_PROVIDER_SITE_OTHER): Payer: Self-pay | Admitting: Ophthalmology

## 2021-06-26 ENCOUNTER — Encounter: Payer: Self-pay | Admitting: Adult Health

## 2021-06-26 ENCOUNTER — Ambulatory Visit: Payer: Medicare Other | Admitting: Adult Health

## 2021-06-26 VITALS — BP 160/95 | HR 78 | Ht 63.0 in | Wt 195.0 lb

## 2021-06-26 DIAGNOSIS — M791 Myalgia, unspecified site: Secondary | ICD-10-CM

## 2021-06-26 DIAGNOSIS — R531 Weakness: Secondary | ICD-10-CM

## 2021-06-26 NOTE — Progress Notes (Signed)
[facility] [address] [place]. [place] [address] [phone]       OFFICE FOLLOW UP NOTE  Ms. Alexandra Hoffman Date of Birth:  Apr 08, 1950 Medical Record Number:  [id number]    Primary neurologist: Dr. Terrace Arabia Reason for visit: b/l UE pain/weakness    SUBJECTIVE:   CHIEF COMPLAINT:  Chief Complaint  Patient presents with   Follow-up    RM 2 with sister Alexandra Hoffman Pt is well, states her weakness has slightly improved but it is worse in the morning when she first gets up.     HPI:   Update 06/26/2021 JM: Patient returns for follow-up after prior initial visit with Dr. Terrace Arabia 3 months ago regarding bilateral upper extremity weakness.  She is accompanied by her sister.  Symptoms have been relatively stable with some slight improvement. Does continue to have pain and weakness (heavy sensation) in back of arms bilaterally after increased activity or the following day. Does not usually have pain during activity. Denies any other associated or new symptoms.  Completed extensive lab work at prior visit with immunofixation showing IgA protein with kappa light chain specificity otherwise all labs negative/WNL. No further concerns at this time.     History provided for reference purposes only Consult visit 03/23/2021 Dr. Terrace Arabia: Alexandra Hoffman is a 71 year old female, seen in request by her primary care doctor Benita Stabile, for evaluation of bilateral upper extremity weakness, she is accompanied by her sister at today's visit March 23, 2021   I reviewed and summarized the referring note. 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, she denies difficulty performing her job, since summer 2022, she began to noticed bilateral arm deep achy pain  Symptoms seems to started in summer 2022, she woke up 1 night felt muscle cramping at left biceps region, she has to stretch improved to alleviate discomfort, few nights later, she had a  similar involvement of the right arm, ever since then, she had persistent deep achy discomfort subjective weakness of bilateral arm,  She denies sensory loss, denies significant neck pain, denies bowel and bladder incontinence, she has mildly gait difficulty she contributed to her knee pain, did not notice any new onset lower extremity weakness      ROS:   14 system review of systems performed and negative with exception of those listed in HPI  PMH:  Past Medical History:  Diagnosis Date   Anxiety    Arthritis    knees   Constipation    Depression    GERD (gastroesophageal reflux disease)    Hyperlipidemia 06/12/2020   Hypertension     PSH:  Past Surgical History:  Procedure Laterality Date   COLON SURGERY  2006   perforated sigmoid diverticulitis, Dr Lovell Sheehan   COLONOSCOPY  2006   Dr. Jena Gauss: normal rectum and a few sigmoid diverticula, 7 mm angry pedunculated polyp at 30 cm, 4 mm sessile polyp base of cecum. Unknown path    COLONOSCOPY WITH PROPOFOL N/A 03/04/2017   Procedure: COLONOSCOPY WITH PROPOFOL;  Surgeon: Corbin Ade, MD;  [facility];  Service: Endoscopy;  Laterality: N/A;  7:30am   COLONOSCOPY WITH PROPOFOL N/A 04/07/2020   Procedure: COLONOSCOPY WITH PROPOFOL;  Surgeon: Corbin Ade, MD;  [facility];  Service: Endoscopy;  Laterality: N/A;  AM (wants early as possible due to nausea)   ESOPHAGOGASTRODUODENOSCOPY (EGD) WITH PROPOFOL N/A 03/19/2019   Rourk:  Gastric polyps (biopsy showed fundic gland), small hiatal hernia.   POLYPECTOMY  03/04/2017   Procedure: POLYPECTOMY;  Surgeon: Corbin Ade, MD;  [facility];  Service: Endoscopy;;  polyp at cecum and left colon   POLYPECTOMY  03/19/2019   Procedure: POLYPECTOMY;  Surgeon: Corbin Ade, MD;  [facility];  Service: Endoscopy;;  gastric   POLYPECTOMY  04/07/2020   Procedure: POLYPECTOMY;  Surgeon: Corbin Ade, MD;  [facility];  Service: Endoscopy;;     Social History:  Social History   Socioeconomic History   Marital status: Widowed    Spouse name: Not on file   Number of children: 2   Years of education: 10   Highest education level: Not on file  Occupational History   Occupation: Producer, television/film/video    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   Social Determinants of Health   Financial Resource Strain: Not on file  Food Insecurity: Not on file  Transportation Needs: Not on file  Physical Activity: Not on file  Stress: Not on file  Social Connections: Not on file  Intimate Partner Violence: Not on file    Family History:  Family History  Problem Relation Age of Onset   Arthritis Mother    Depression Mother    Diabetes Mother    Heart disease Mother    Hypertension Mother    Stroke Mother    Kidney disease Mother 33       dialysis   Alcohol abuse Father    Arthritis Father    Cancer Father 29       brain   Hypertension Father    Hypertension Sister    Arthritis Sister    Hypertension Brother    Arthritis Brother    Hypertension Brother    Arthritis Brother    Hypertension Brother    Hypertension Brother    Hypertension Brother    Hypertension Brother    Other Brother        MVA   Colon cancer Neg Hx     Medications:   Current Outpatient Medications on File Prior to Visit  Medication Sig Dispense Refill   ALPRAZolam (XANAX) 0.5 MG tablet Take 0.5 mg by mouth at bedtime.     DULoxetine (CYMBALTA) 30 MG capsule Take 1 capsule (30 mg total) by mouth daily. 30 capsule 6   gabapentin (NEURONTIN) 100  MG capsule Take 100 mg by mouth 3 (three) times daily.     guaiFENesin (MUCINEX) 600 MG 12 hr tablet Take 600 mg by mouth in the morning.     levocetirizine (XYZAL) 5 MG tablet Take 5 mg by mouth at bedtime.     LINZESS 72 MCG capsule TAKE ONE CAPSULE BY MOUTH ONCE DAILY WITH BREAKFAST. 30 capsule 5   lisinopril (PRINIVIL,ZESTRIL) 10 MG tablet Take 10 mg by mouth in the morning.     meloxicam (MOBIC) 15 MG tablet Take 15 mg by mouth 2 (two) times a  week.     Omega-3 Fatty Acids (FISH OIL) 1200 MG CAPS Take 1,200 mg by mouth in the morning.     pantoprazole (PROTONIX) 40 MG tablet TAKE ONE TABLET BY MOUTH ONCE DAILY BEFORE BREAKFAST. 30 tablet 3   vitamin B-12 (CYANOCOBALAMIN) 1000 MCG tablet Take 1,000 mcg by mouth in the morning.     No current facility-administered medications on file prior to visit.    Allergies:  No Known Allergies    OBJECTIVE:  Physical Exam  Vitals:   06/26/21 0933  BP: (!) 160/95  Pulse: 78  Weight: 195 lb (88.5 kg)  Height: 5\' 3"  (1.6 m)   Body mass index is 34.54 kg/m. No results found.   General: well developed, well nourished, very pleasant elderly Caucasian female, seated, in no evident distress Head: head normocephalic and atraumatic.   Neck: supple with no carotid or supraclavicular bruits Cardiovascular: regular rate and rhythm, no murmurs Musculoskeletal: no deformity Skin:  no rash/petichiae Vascular:  Normal pulses all extremities   Neurologic Exam Mental Status: Awake and fully alert. Oriented to place and time. Recent and remote memory intact. Attention span, concentration and fund of knowledge appropriate. Mood and affect appropriate.  Cranial Nerves: Pupils equal, briskly reactive to light. Extraocular movements full without nystagmus. Visual fields full to confrontation. Hearing intact. Facial sensation intact. Face, tongue, palate moves normally and symmetrically.  Motor: Normal bulk and tone. Normal strength in all tested  extremity muscles. Unable to appreciate any weakness on today's exam Sensory.: intact to touch , pinprick , position and vibratory sensation.  Coordination: Rapid alternating movements normal in all extremities. Finger-to-nose and heel-to-shin performed accurately bilaterally. Gait and Station: Arises from chair without difficulty. Stance is normal. Gait demonstrates normal stride length and balance without use of AD.  Reflexes: 1+ and symmetric. Toes downgoing.         ASSESSMENT/PLAN: Alexandra Hoffman is a 71 y.o. year old female  More than 6 months history of persistent bilateral upper extremity deep muscle achy pain, weakness,             Laboratory evaluation showed IgA monoclonal protein with kappa light chain specificity with monoclonal bands faint - repeat today; all other labs WNL/negative   Will discuss with Dr. Terrace Arabia if further workup/evaluation is needed from our standpoint at this time.   Discussed potential benefit with PT - will further discuss with Dr. Terrace Arabia as well - patient unsure if she would like to pursue therapy more so due to financial aspect             per Dr. Terrace Arabia at initial visit: Differentiation diagnosis include polymyalgia rheumatica, versus intrinsic muscle disease            Continue Cymbalta 30 mg for symptomatic treatment - can be managed by PCP       CC:  [facility] provider: Dr. Terrace Arabia PCP: Benita Stabile, MD    I spent 24 minutes of face-to-face and non-face-to-face time with patient and sister.  This included previsit chart review, lab review, study review, order entry, electronic health record documentation, patient and sister education and discussion regarding bilateral upper extremity pains as noted above and answered all other questions to patient and sister satisfaction   Ihor Austin, [facility]  [facility] [address] [place], [place] [address]  Phone [phone] Fax [phone] Note: This document was prepared with  digital dictation and possible smart phrase technology. Any transcriptional errors that result from this  process are unintentional.

## 2021-06-29 LAB — MULTIPLE MYELOMA PANEL, SERUM
Albumin SerPl Elph-Mcnc: 4 g/dL (ref 2.9–4.4)
Albumin/Glob SerPl: 1.3 (ref 0.7–1.7)
Alpha 1: 0.2 g/dL (ref 0.0–0.4)
Alpha2 Glob SerPl Elph-Mcnc: 0.7 g/dL (ref 0.4–1.0)
B-Globulin SerPl Elph-Mcnc: 0.9 g/dL (ref 0.7–1.3)
Gamma Glob SerPl Elph-Mcnc: 1.2 g/dL (ref 0.4–1.8)
Globulin, Total: 3.1 g/dL (ref 2.2–3.9)
IgA/Immunoglobulin A, Serum: 199 mg/dL (ref 87–352)
IgG (Immunoglobin G), Serum: 1112 mg/dL (ref 586–1602)
IgM (Immunoglobulin M), Srm: 110 mg/dL (ref 26–217)
Total Protein: 7.1 g/dL (ref 6.0–8.5)

## 2021-07-11 ENCOUNTER — Telehealth: Payer: Self-pay | Admitting: *Deleted

## 2021-07-11 NOTE — Telephone Encounter (Signed)
Spoke with patient and informed her the laboratory evaluation really did not show any significant abnormality.  If she is continuing to be limited by her symptoms, may consider MRI of the cervical spine.  It is okay to hold off physical therapy due to her financial concerns.  Please let me know if she would like to proceed with cervical imaging. Patient stated another MD suggested MRI but she wants to think about it. She will call back if she decides to have one done. Patient verbalized understanding, appreciation.

## 2021-07-12 NOTE — Progress Notes (Signed)
[facility] Note  07/17/2021     CHIEF COMPLAINT Patient presents for Retina Follow Up   HISTORY OF PRESENT ILLNESS: Alexandra Hoffman is a 71 y.o. female who presents to the clinic today for:   HPI     Retina Follow Up   Patient presents with  PVD.  In left eye.  Severity is moderate.  Duration of 6 weeks.  Since onset it is stable.  I, the attending physician,  performed the HPI with the patient and updated documentation appropriately.        Comments   Pt here for 6 wk ret f/u for PVD OS/ret hole OD. Pt states VA the same, no changes.       Last edited by [name], MD on 07/17/2021  2:01 PM.     Pt states  vision is stable, states her eyes are red in the morning, but clear up throughout the day  Referring physician: Madelin Headings, DO [address]  [place],  [id number]  HISTORICAL INFORMATION:   Selected notes from the MEDICAL RECORD NUMBER Referred by Dr. Jorja Loa LEE: 01/09/2021 Ocular Hx- Cataracts, DES OU, RD OS    CURRENT MEDICATIONS: No current outpatient medications on file. (Ophthalmic Drugs)   No current facility-administered medications for this visit. (Ophthalmic Drugs)   Current Outpatient Medications (Other)  Medication Sig   ALPRAZolam (XANAX) 0.5 MG tablet Take 0.5 mg by mouth at bedtime.   DULoxetine (CYMBALTA) 30 MG capsule Take 1 capsule (30 mg total) by mouth daily.   gabapentin (NEURONTIN) 100 MG capsule Take 100 mg by mouth 3 (three) times daily.   guaiFENesin (MUCINEX) 600 MG 12 hr tablet Take 600 mg by mouth in the morning.   levocetirizine (XYZAL) 5 MG tablet Take 5 mg by mouth at bedtime.   LINZESS 72 MCG capsule TAKE ONE CAPSULE BY MOUTH ONCE DAILY WITH BREAKFAST.   lisinopril (PRINIVIL,ZESTRIL) 10 MG tablet Take 10 mg by mouth in the morning.   meloxicam (MOBIC) 15 MG tablet Take 15 mg by mouth 2 (two) times a week.   Omega-3 Fatty Acids (FISH OIL) 1200 MG CAPS Take 1,200 mg by mouth in the morning.    pantoprazole (PROTONIX) 40 MG tablet TAKE ONE TABLET BY MOUTH ONCE DAILY BEFORE BREAKFAST.   vitamin B-12 (CYANOCOBALAMIN) 1000 MCG tablet Take 1,000 mcg by mouth in the morning.   No current facility-administered medications for this visit. (Other)   REVIEW OF SYSTEMS: ROS   Positive for: Gastrointestinal, Musculoskeletal, Eyes, Psychiatric Negative for: Constitutional, Neurological, Skin, Genitourinary, HENT, Endocrine, Cardiovascular, Respiratory, Allergic/Imm, Heme/Lymph Last edited by Kingsley Spittle, COT on 07/17/2021  9:03 AM.      ALLERGIES No Known Allergies  PAST MEDICAL HISTORY Past Medical History:  Diagnosis Date   Anxiety    Arthritis    knees   Constipation    Depression    GERD (gastroesophageal reflux disease)    Hyperlipidemia 06/12/2020   Hypertension    Past Surgical History:  Procedure Laterality Date   COLON SURGERY  2006   perforated sigmoid diverticulitis, Dr Arnoldo Morale   COLONOSCOPY  2006   Dr. Gala Romney: normal rectum and a few sigmoid diverticula, 7 mm angry pedunculated polyp at 30 cm, 4 mm sessile polyp base of cecum. Unknown path    COLONOSCOPY WITH PROPOFOL N/A 03/04/2017   Procedure: COLONOSCOPY WITH PROPOFOL;  Surgeon: Daneil Dolin, MD;  [facility];  Service: Endoscopy;  Laterality: N/A;  7:30am   COLONOSCOPY WITH PROPOFOL N/A 04/07/2020   Procedure: COLONOSCOPY WITH PROPOFOL;  Surgeon: Daneil Dolin, MD;  [facility];  Service: Endoscopy;  Laterality: N/A;  AM (wants early as possible due to nausea)   ESOPHAGOGASTRODUODENOSCOPY (EGD) WITH PROPOFOL N/A 03/19/2019   Rourk: Gastric polyps (biopsy showed fundic gland), small hiatal hernia.   POLYPECTOMY  03/04/2017   Procedure: POLYPECTOMY;  Surgeon: Daneil Dolin, MD;  [facility];  Service: Endoscopy;;  polyp at cecum and left colon   POLYPECTOMY  03/19/2019   Procedure: POLYPECTOMY;  Surgeon: Daneil Dolin, MD;  [facility];  Service:  Endoscopy;;  gastric   POLYPECTOMY  04/07/2020   Procedure: POLYPECTOMY;  Surgeon: Daneil Dolin, MD;  [facility];  Service: Endoscopy;;   FAMILY HISTORY Family History  Problem Relation Age of Onset   Arthritis Mother    Depression Mother    Diabetes Mother    Heart disease Mother    Hypertension Mother    Stroke Mother    Kidney disease Mother 38       dialysis   Alcohol abuse Father    Arthritis Father    Cancer Father 46       brain   Hypertension Father    Hypertension Sister    Arthritis Sister    Hypertension Brother    Arthritis Brother    Hypertension Brother    Arthritis Brother    Hypertension Brother    Hypertension Brother    Hypertension Brother    Hypertension Brother    Other Brother        MVA   Colon cancer Neg Hx    SOCIAL HISTORY Social History   Tobacco Use   Smoking status: Never   Smokeless tobacco: Never  Vaping Use   Vaping Use: Never used  Substance Use Topics   Alcohol use: No   Drug use: No       OPHTHALMIC EXAM:  Base Eye Exam     Visual Acuity (Snellen - Linear)       Right Left   Dist cc 20/30 -2 20/20   Dist ph cc 20/25 -1     Correction: Glasses         Tonometry (Tonopen, 9:07 AM)       Right Left   Pressure 17 15         Pupils       Dark Light Shape React APD   Right 3 2 Round Brisk None   Left 3 2 Round Brisk None         Visual Fields (Counting fingers)       Left Right    Full Full         Extraocular Movement       Right Left    Full, Ortho Full, Ortho         Neuro/Psych     Oriented x3: Yes   Mood/Affect: Normal         Dilation     Both eyes: 1.0% Mydriacyl, 2.5% Phenylephrine @ 9:07 AM           Slit Lamp and Fundus Exam     Slit Lamp Exam       Right Left   Lids/Lashes Dermatochalasis - upper lid Dermatochalasis - upper lid   Conjunctiva/Sclera small nasal and temporal pinguecula small temporal pinguecula, no injection   Cornea arcus, 1+  fine Punctate epithelial  erosions, tear film debris arcus, 1+fine Punctate epithelial erosions   Anterior Chamber deep, clear, narrow angles deep, clear, narrow angles   Iris Round and reactive Round and dilated   Lens 2+ Nuclear sclerosis, 2+ Cortical cataract 2+ Nuclear sclerosis, 2-3+ Cortical cataract   Anterior Vitreous Mild Vitreous syneresis Mild Vitreous syneresis, PVD, Weiss ring, vitreous condensations - improved         Fundus Exam       Right Left   Disc Pink and Sharp Pink and Sharp, mild tilt   C/D Ratio 0.5 0.3   Macula Flat, good foveal reflex, RPE mottling, No heme or edema Flat, good foveal reflex, mild RPE mottling, No heme or edema   Vessels mild attenuation, mild tortuosity attenuated, copper wiring, Tortuous   Periphery focal retinal tear at 1100 with SRF -- focal RD -- good laser changes from 0900-1130, focal schisis cavity at 0730 -- very tiny, no outer retinal hole, mild reticular degeneration, pigmented cystoid degeneration, no new RT/RD Attached, bullous schisis cavity from 0100-0200 periphery, shallow schisis cavity IT periphery w/ peripheral outer retinal hole -- good laser changes surrounding, reticular degeneration, no new RT/RD or lattice           Refraction     Wearing Rx       Sphere Cylinder Axis Add   Right +1.25 +0.25 060 +2.25   Left +1.25 Sphere  +2.25           IMAGING AND PROCEDURES  Imaging and Procedures for 07/17/2021  OCT, Retina - OU - Both Eyes       Right Eye Quality was good. Central Foveal Thickness: 260. Progression has been stable. Findings include normal foveal contour, no IRF, subretinal fluid, vitreomacular adhesion (Shallow SRF ST periphery caught on widefield - not imaged today).   Left Eye Quality was good. Central Foveal Thickness: 270. Progression has been stable. Findings include normal foveal contour, no IRF, no SRF, subretinal fluid, vitreomacular adhesion (stable release of VMA to full PVD, vitreous  opacities - improved, bullous retinoschisis ST and IT periphery caught on widefield ).   Notes *Images captured and stored on drive  Diagnosis / Impression:  OD: NFP; no IRF/SRF centrally; shallow SRF ST periphery caught on widefield--not imaged today OS: stable release of VMA to full PVD, vitreous opacities - improved, bullous retinoschisis ST and IT periphery caught on widefield   Clinical management:  See below  Abbreviations: NFP - Normal foveal profile. CME - cystoid macular edema. PED - pigment epithelial detachment. IRF - intraretinal fluid. SRF - subretinal fluid. EZ - ellipsoid zone. ERM - epiretinal membrane. ORA - outer retinal atrophy. ORT - outer retinal tubulation. SRHM - subretinal hyper-reflective material. IRHM - intraretinal hyper-reflective material            ASSESSMENT/PLAN:    ICD-10-CM   1. Bilateral retinoschisis  H33.103 OCT, Retina - OU - Both Eyes    2. Retinal hole of both eyes  H33.323     3. Right retinal detachment  H33.21     4. Posterior vitreous detachment of left eye  H43.812     5. Essential hypertension  I10     6. Hypertensive retinopathy of both eyes  H35.033     7. Combined forms of age-related cataract of both eyes  H25.813       1. Retinoschisis OU  - OD w/ shallow schisis cavity IT periphery  - OS w/ bullous retinoschisis ST periphery (0100-0200) and shallow schisis IT  periphery w/ peripheral outer retinal hole  - asymptomatic, BCVA 20/25 OD, 20/20 OS  - OS lesion found during routine exam by Dr. Jorja Loa  - s/p laser retinopexy OS (01.13.23) to patches of schisis -- good laser changes in place surrounding schisis  - no treatment recommended for shallow schisis OD at this time---monitor  - f/u in 4-6 mos, sooner prn -- DFE/OCT  2,3. Retinal hole w/ focal retinal detachment OD  - new retinal hole at 11 w/ mild heme and surrounding shallow SRF / RD from 09-1128 noted 4.7.23  - asymptomatic  - s/p laser retinopexy OD 04.07.23 --  good laser changes surrounding - f/u 4-6 months DFE, OCT  4. PVD / vitreous syneresis OS  - acute symptomatic flashes / floaters OS -- onset 06.04.23  - Discussed findings and prognosis  - No RT or RD on 360 scleral depressed exam  - Reviewed s/s of RT/RD  - Strict return precautions for any such RT/RD signs/symptoms  - f/u in 4-6 months  5,6. Hypertensive retinopathy OU - discussed importance of tight BP control - monitor  7. Mixed Cataract OU - The symptoms of cataract, surgical options, and treatments and risks were discussed with patient. - discussed diagnosis and progression - not yet visually significant - monitor for now  Ophthalmic Meds Ordered this visit:  No orders of the defined types were placed in this encounter.    Return for f/u 4-6 months, retinoschisis OU, DFE, OCT.  There are no Patient Instructions on file for this visit.   Explained the diagnoses, plan, and follow up with the patient and they expressed understanding.  Patient expressed understanding of the importance of proper follow up care.    This document serves as a record of services personally performed by Gardiner Sleeper, MD, PhD. It was created on their behalf by Roselee Nova, COMT. The creation of this record is the provider's dictation and/or activities during the visit.  Electronically signed by: Roselee Nova, COMT 07/17/21 2:02 PM  This document serves as a record of services personally performed by Gardiner Sleeper, MD, PhD. It was created on their behalf by San Jetty. Owens Shark, OA an ophthalmic technician. The creation of this record is the provider's dictation and/or activities during the visit.    Electronically signed by: San Jetty. Owens Shark, [place] 07.17.2023 2:02 PM  Gardiner Sleeper, M.D., Ph.D. Diseases & Surgery of the Retina and Vitreous [facility]  I have reviewed the above documentation for accuracy and completeness, and I agree with the above. Gardiner Sleeper, M.D.,  Ph.D. 07/17/21 2:04 PM  Abbreviations: M myopia (nearsighted); A astigmatism; H hyperopia (farsighted); P presbyopia; Mrx spectacle prescription;  CTL contact lenses; OD right eye; OS left eye; OU both eyes  XT exotropia; ET esotropia; PEK punctate epithelial keratitis; PEE punctate epithelial erosions; DES dry eye syndrome; MGD meibomian gland dysfunction; ATs artificial tears; PFAT's preservative free artificial tears; [place] nuclear sclerotic cataract; PSC posterior subcapsular cataract; ERM epi-retinal membrane; PVD posterior vitreous detachment; RD retinal detachment; DM diabetes mellitus; DR diabetic retinopathy; NPDR non-proliferative diabetic retinopathy; PDR proliferative diabetic retinopathy; CSME clinically significant macular edema; DME diabetic macular edema; dbh dot blot hemorrhages; CWS cotton wool spot; POAG primary open angle glaucoma; C/D cup-to-disc ratio; HVF humphrey visual field; GVF goldmann visual field; OCT optical coherence tomography; IOP intraocular pressure; BRVO Branch retinal vein occlusion; CRVO central retinal vein occlusion; CRAO central retinal artery occlusion; BRAO branch retinal artery occlusion; RT retinal tear;  SB scleral buckle; PPV pars plana vitrectomy; VH Vitreous hemorrhage; PRP panretinal laser photocoagulation; IVK intravitreal kenalog; VMT vitreomacular traction; MH Macular hole;  NVD neovascularization of the disc; NVE neovascularization elsewhere; AREDS age related eye disease study; ARMD age related macular degeneration; POAG primary open angle glaucoma; EBMD epithelial/anterior basement membrane dystrophy; ACIOL anterior chamber intraocular lens; IOL intraocular lens; PCIOL posterior chamber intraocular lens; Phaco/IOL phacoemulsification with intraocular lens placement; PRK photorefractive keratectomy; LASIK laser assisted in situ keratomileusis; HTN hypertension; DM diabetes mellitus; COPD chronic obstructive pulmonary disease 

## 2021-07-17 ENCOUNTER — Ambulatory Visit (INDEPENDENT_AMBULATORY_CARE_PROVIDER_SITE_OTHER): Payer: Medicare Other | Admitting: Ophthalmology

## 2021-07-17 ENCOUNTER — Encounter (INDEPENDENT_AMBULATORY_CARE_PROVIDER_SITE_OTHER): Payer: Self-pay | Admitting: Ophthalmology

## 2021-07-17 DIAGNOSIS — H33323 Round hole, bilateral: Secondary | ICD-10-CM

## 2021-07-17 DIAGNOSIS — H43812 Vitreous degeneration, left eye: Secondary | ICD-10-CM

## 2021-07-17 DIAGNOSIS — H3321 Serous retinal detachment, right eye: Secondary | ICD-10-CM | POA: Diagnosis not present

## 2021-07-17 DIAGNOSIS — H33103 Unspecified retinoschisis, bilateral: Secondary | ICD-10-CM

## 2021-07-17 DIAGNOSIS — I10 Essential (primary) hypertension: Secondary | ICD-10-CM

## 2021-07-17 DIAGNOSIS — H35033 Hypertensive retinopathy, bilateral: Secondary | ICD-10-CM

## 2021-07-17 DIAGNOSIS — H25813 Combined forms of age-related cataract, bilateral: Secondary | ICD-10-CM

## 2021-07-28 DIAGNOSIS — L259 Unspecified contact dermatitis, unspecified cause: Secondary | ICD-10-CM | POA: Insufficient documentation

## 2021-07-28 DIAGNOSIS — L309 Dermatitis, unspecified: Secondary | ICD-10-CM | POA: Insufficient documentation

## 2021-08-29 DIAGNOSIS — R070 Pain in throat: Secondary | ICD-10-CM | POA: Insufficient documentation

## 2021-08-29 DIAGNOSIS — U071 COVID-19: Secondary | ICD-10-CM | POA: Insufficient documentation

## 2021-08-29 DIAGNOSIS — R0981 Nasal congestion: Secondary | ICD-10-CM | POA: Insufficient documentation

## 2021-09-07 DIAGNOSIS — R058 Other specified cough: Secondary | ICD-10-CM | POA: Insufficient documentation

## 2021-11-08 DIAGNOSIS — H6590 Unspecified nonsuppurative otitis media, unspecified ear: Secondary | ICD-10-CM | POA: Insufficient documentation

## 2021-11-08 DIAGNOSIS — M542 Cervicalgia: Secondary | ICD-10-CM | POA: Insufficient documentation

## 2021-11-08 DIAGNOSIS — H9201 Otalgia, right ear: Secondary | ICD-10-CM | POA: Insufficient documentation

## 2021-11-08 DIAGNOSIS — H6691 Otitis media, unspecified, right ear: Secondary | ICD-10-CM | POA: Insufficient documentation

## 2021-11-09 DIAGNOSIS — M1712 Unilateral primary osteoarthritis, left knee: Secondary | ICD-10-CM | POA: Diagnosis not present

## 2021-11-20 ENCOUNTER — Other Ambulatory Visit: Payer: Self-pay | Admitting: Gastroenterology

## 2022-01-02 NOTE — Progress Notes (Signed)
Sent message, via epic in basket, requesting orders in epic from surgeon.  

## 2022-01-03 NOTE — H&P (Signed)
KNEE ARTHROPLASTY ADMISSION H&P  Patient ID: Alexandra Hoffman MRN: [id number] DOB/AGE: 1950/06/05 72 y.o.  Chief Complaint: left knee pain.  Planned Procedure Date: 01/22/22 Medical and Cardiac Clearance by Dr. Delphina Cahill     HPI: Alexandra Hoffman is a 72 y.o. female who presents for evaluation of OSTEOARTHRISTIS  LEFT KNEE. The patient has a history of pain and functional disability in the left knee due to arthritis and has failed non-surgical conservative treatments for greater than 12 weeks to include NSAID's and/or analgesics, corticosteriod injections, viscosupplementation injections, and activity modification.  Onset of symptoms was gradual, starting >10 years ago with gradually worsening course since that time. The patient noted no past surgery on the left knee.  Patient currently rates pain at 6 out of 10 with activity. Patient has night pain, worsening of pain with activity and weight bearing, and pain that interferes with activities of daily living.  Patient has evidence of subchondral sclerosis, periarticular osteophytes, and joint space narrowing by imaging studies.  There is no active infection.  Past Medical History:  Diagnosis Date   Anxiety    Arthritis    knees   Constipation    Depression    GERD (gastroesophageal reflux disease)    Hyperlipidemia 06/12/2020   Hypertension    Past Surgical History:  Procedure Laterality Date   COLON SURGERY  2006   perforated sigmoid diverticulitis, Dr Arnoldo Morale   COLONOSCOPY  2006   Dr. Gala Romney: normal rectum and a few sigmoid diverticula, 7 mm angry pedunculated polyp at 30 cm, 4 mm sessile polyp base of cecum. Unknown path    COLONOSCOPY WITH PROPOFOL N/A 03/04/2017   Procedure: COLONOSCOPY WITH PROPOFOL;  Surgeon: Daneil Dolin, MD;  [facility];  Service: Endoscopy;  Laterality: N/A;  7:30am   COLONOSCOPY WITH PROPOFOL N/A 04/07/2020   Procedure: COLONOSCOPY WITH PROPOFOL;  Surgeon: Daneil Dolin, MD;  [facility];  Service: Endoscopy;  Laterality: N/A;  AM (wants early as possible due to nausea)   ESOPHAGOGASTRODUODENOSCOPY (EGD) WITH PROPOFOL N/A 03/19/2019   Rourk: Gastric polyps (biopsy showed fundic gland), small hiatal hernia.   POLYPECTOMY  03/04/2017   Procedure: POLYPECTOMY;  Surgeon: Daneil Dolin, MD;  [facility];  Service: Endoscopy;;  polyp at cecum and left colon   POLYPECTOMY  03/19/2019   Procedure: POLYPECTOMY;  Surgeon: Daneil Dolin, MD;  [facility];  Service: Endoscopy;;  gastric   POLYPECTOMY  04/07/2020   Procedure: POLYPECTOMY;  Surgeon: Daneil Dolin, MD;  [facility];  Service: Endoscopy;;   No Known Allergies Prior to Admission medications   Medication Sig Start Date End Date Taking? Authorizing Provider  ALPRAZolam Duanne Moron) 0.5 MG tablet Take 0.5 mg by mouth at bedtime.    [provider]  DULoxetine (CYMBALTA) 30 MG capsule Take 1 capsule (30 mg total) by mouth daily. 03/23/21   Marcial Pacas, MD  gabapentin (NEURONTIN) 100 MG capsule Take 100 mg by mouth 3 (three) times daily. 03/02/21   [provider]  guaiFENesin (MUCINEX) 600 MG 12 hr tablet Take 600 mg by mouth in the morning.    [provider]  levocetirizine (XYZAL) 5 MG tablet Take 5 mg by mouth at bedtime. 01/08/20   [provider]  linaclotide Rolan Lipa) 72 MCG capsule TAKE ONE CAPSULE BY MOUTH ONCE DAILY WITH BREAKFAST. 11/20/21   Mahala Menghini, PA-C  lisinopril (PRINIVIL,ZESTRIL) 10 MG tablet Take 10 mg by  mouth in the morning.    [provider]  meloxicam (MOBIC) 15 MG tablet Take 15 mg by mouth 2 (two) times a week.    [provider]  Omega-3 Fatty Acids (FISH OIL) 1200 MG CAPS Take 1,200 mg by mouth in the morning.    [provider]  pantoprazole (PROTONIX) 40 MG tablet TAKE ONE TABLET BY MOUTH ONCE DAILY BEFORE BREAKFAST. 09/06/20   Annitta Needs, NP  vitamin B-12 (CYANOCOBALAMIN) 1000 MCG tablet Take 1,000  mcg by mouth in the morning.    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   Social Determinants of Health   Financial Resource Strain: Not on file  Food Insecurity: Not on file  Transportation Needs: Not on file  Physical Activity: Not on file  Stress: Not on file  Social Connections: Not on file   Family History  Problem Relation Age of Onset   Arthritis Mother    Depression Mother    Diabetes Mother    Heart disease Mother    Hypertension Mother    Stroke Mother    Kidney disease Mother 26       dialysis   Alcohol abuse Father    Arthritis Father    Cancer Father 81       brain   Hypertension Father    Hypertension Sister    Arthritis Sister    Hypertension Brother    Arthritis Brother    Hypertension Brother    Arthritis Brother    Hypertension Brother    Hypertension Brother    Hypertension Brother    Hypertension Brother    Other Brother        MVA   Colon cancer Neg Hx     ROS: Currently denies lightheadedness, dizziness, Fever, chills, CP, SOB.   No personal history of DVT, PE, MI, or CVA. No loose teeth or dentures All other systems have been reviewed and were otherwise currently negative with the exception of those mentioned in the HPI and as above.  Objective: Vitals: Ht: 5'6" Wt:  200 lbs Temp: 98.0 BP: 150/88 Pulse: 84 O2 97% on room air.   Physical Exam: General: Alert, NAD.  Antalgic Gait  HEENT: EOMI, Good Neck Extension  Pulm: No increased work of breathing.  Clear B/L A/P w/o crackle or wheeze.  CV: RRR, No m/g/r appreciated  GI: soft, NT, ND. BS x 4 quadrants Neuro: CN II-XII grossly intact without focal deficit.  Sensation intact distally Skin: No lesions in the area of chief complaint MSK/Surgical Site:  + JLT. ROM 10-95 degrees.  Decreased strength in extension and flexion.  +EHL/FHL.  NVI.  Stable varus and valgus stress.  Imaging Review Plain radiographs demonstrate severe degenerative joint disease of the bilateral knees.   The overall alignment ismild valgus. The bone quality appears to be fair for age and reported activity level.  Preoperative templating of the joint replacement has been completed, documented, and submitted to the Operating Room personnel in order to optimize intra-operative equipment management.  Assessment: OSTEOARTHRISTIS  LEFT KNEE Active Problems:   * No active hospital problems. *   Plan: Plan for Procedure(s): LEFT TOTAL KNEE ARTHROPLASTY  The patient history, physical exam, clinical judgement of the provider and imaging are consistent with end stage degenerative joint disease and total joint arthroplasty is deemed medically necessary. The treatment options including medical management, injection therapy, and arthroplasty were discussed at length. The risks and benefits of Procedure(s): LEFT TOTAL KNEE ARTHROPLASTY were presented and reviewed.  The risks of nonoperative treatment, versus surgical intervention including but not limited to continued pain, aseptic loosening, stiffness, dislocation/subluxation, infection, bleeding, nerve injury, blood clots, cardiopulmonary complications, morbidity, mortality, among others were discussed. The patient verbalizes understanding and wishes to proceed with the plan.  Patient is  being admitted for inpatient treatment for surgery, pain control, PT, prophylactic antibiotics, VTE prophylaxis, progressive ambulation, ADL's and discharge planning.   Dental prophylaxis discussed and recommended for 2 years postoperatively.  The patient does meet the criteria for TXA which will be used perioperatively.   ASA 81 mg BID will be used postoperatively for DVT prophylaxis in addition to SCDs, and early ambulation. Plan for Tylenol, Mobic, oxycodone for pain.    Zofran for nausea and vomiting. Linzess for constipation [place] The patient is planning to be discharged home with OPPT and into the care of her sister Mick Sell who can be reached at [phone] Follow up appt 02/06/22 at 2:45pm     Alisa Graff Office [phone] 01/03/2022 6:33 PM

## 2022-01-04 NOTE — Patient Instructions (Addendum)
SURGICAL WAITING ROOM VISITATION  Patients having surgery or a procedure may have no more than 2 support people in the waiting area - these visitors may rotate.    Children under the age of 72 must have an adult with them who is not the patient.  Due to an increase in RSV and influenza rates and associated hospitalizations, children ages 108 and under may not visit patients in [place].  If the patient needs to stay at the hospital during part of their recovery, the visitor guidelines for inpatient rooms apply. Pre-op nurse will coordinate an appropriate time for 1 support person to accompany patient in pre-op.  This support person may not rotate.    Please refer to the [facility] website for the visitor guidelines for Inpatients (after your surgery is over and you are in a regular room).       Your procedure is scheduled on:  01/22/2022    Report to [facility] Main Entrance    Report to admitting at   5:15 AM   Call this number if you have problems the morning of surgery [phone]   Do not eat food :After Midnight.   After Midnight you may have the following liquids until 4:30 AM  DAY OF SURGERY  Water Non-Citrus Juices (without pulp, NO RED-Apple, White grape, White cranberry) Black Coffee (NO MILK/CREAM OR CREAMERS, sugar ok)  Clear Tea (NO MILK/CREAM OR CREAMERS, sugar ok) regular and decaf                             Plain Jell-O (NO RED)                                           Fruit ices (not with fruit pulp, NO RED)                                     Popsicles (NO RED)                                                               Sports drinks like Gatorade (NO RED)                   The day of surgery:  Drink ONE (1) Pre-Surgery Clear Ensure at  4:30 AM  ( have completed by ) the morning of surgery. Drink in one sitting. Do not sip.  This drink was given to you during your hospital  pre-op appointment visit. Nothing else to drink after  completing the Pre-Surgery Clear Ensure           If you have questions, please contact your surgeon's office.     Oral Hygiene is also important to reduce your risk of infection.                                    Remember - BRUSH YOUR TEETH THE MORNING OF SURGERY WITH YOUR REGULAR TOOTHPASTE  DENTURES WILL  BE REMOVED PRIOR TO SURGERY PLEASE DO NOT APPLY "Poly grip" OR ADHESIVES!!!   Do NOT smoke after Midnight   Take these medicines the morning of surgery with A SIP OF WATER:   Cymbalta Gabapentin Protonix                               You may not have any metal on your body including hair pins, jewelry, and body piercing             Do not wear make-up, lotions, powders, perfumes or deodorant  Do not wear nail polish including gel and S&S, artificial/acrylic nails, or any other type of covering on natural nails including finger and toenails. If you have artificial nails, gel coating, etc. that needs to be removed by a nail salon please have this removed prior to surgery or surgery may need to be canceled/ delayed if the surgeon/ anesthesia feels like they are unable to be safely monitored.   Do not shave  48 hours prior to surgery.              Do not bring valuables to the hospital. [place].   Contacts, glasses, dentures or bridgework may not be worn into surgery.  DO NOT [place]. PHARMACY WILL DISPENSE MEDICATIONS LISTED ON YOUR MEDICATION LIST TO YOU DURING YOUR ADMISSION [place]!    Patients discharged on the day of surgery will not be allowed to drive home.  Someone NEEDS to stay with you for the first 24 hours after anesthesia.   Special Instructions: Bring a copy of your healthcare power of attorney and living will documents the day of surgery if you haven't scanned them before.              Please read over the following fact sheets you were given: IF [place]   If you received a COVID test during your pre-op visit  it is requested that you wear a mask when out in public, stay away from anyone that may not be feeling well and notify your surgeon if you develop symptoms. If you test positive for Covid or have been in contact with anyone that has tested positive in the last 10 days please notify you surgeon.    [place] - Preparing for Surgery Before surgery, you can play an important role.  Because skin is not sterile, your skin needs to be as free of germs as possible.  You can reduce the number of germs on your skin by washing with CHG (chlorahexidine gluconate) soap before surgery.  CHG is an antiseptic cleaner which kills germs and bonds with the skin to continue killing germs even after washing. Please DO NOT use if you have an allergy to CHG or antibacterial soaps.  If your skin becomes reddened/irritated stop using the CHG and inform your nurse when you arrive at Short Stay. Do not shave (including legs and underarms) for at least 48 hours prior to the first CHG shower.  You may shave your face/neck. Please follow these instructions carefully:  1.  Shower with CHG Soap the night before surgery and the  morning of Surgery.  2.  If you choose to wash your hair, wash your hair first as usual with your  normal  shampoo.  3.  After you shampoo, rinse your hair and body thoroughly to remove the  shampoo.                           4.  Use CHG as you would any other liquid soap.  You can apply chg directly  to the skin and wash                       Gently with a scrungie or clean washcloth.  5.  Apply the CHG Soap to your body ONLY FROM THE NECK DOWN.   Do not use on face/ open                           Wound or open sores. Avoid contact with eyes, ears mouth and genitals (private parts).                       Wash face,  Genitals (private parts) with your normal soap.             6.  Wash thoroughly,  paying special attention to the area where your surgery  will be performed.  7.  Thoroughly rinse your body with warm water from the neck down.  8.  DO NOT shower/wash with your normal soap after using and rinsing off  the CHG Soap.                9.  Pat yourself dry with a clean towel.            10.  Wear clean pajamas.            11.  Place clean sheets on your bed the night of your first shower and do not  sleep with pets. Day of Surgery : Do not apply any lotions/deodorants the morning of surgery.  Please wear clean clothes to the hospital/surgery center.  FAILURE TO FOLLOW THESE INSTRUCTIONS MAY RESULT IN THE CANCELLATION OF YOUR SURGERY PATIENT SIGNATURE_________________________________  NURSE SIGNATURE__________________________________  ________________________________________________________________________    Adam Phenix  An incentive spirometer is a tool that can help keep your lungs clear and active. This tool measures how well you are filling your lungs with each breath. Taking long deep breaths may help reverse or decrease the chance of developing breathing (pulmonary) problems (especially infection) following: A long period of time when you are unable to move or be active. BEFORE THE PROCEDURE  If the spirometer includes an indicator to show your best effort, your nurse or respiratory therapist will set it to a desired goal. If possible, sit up straight or lean slightly forward. Try not to slouch. Hold the incentive spirometer in an upright position. INSTRUCTIONS FOR USE  Sit on the edge of your bed if possible, or sit up as far as you can in bed or on a chair. Hold the incentive spirometer in an upright position. Breathe out normally. Place the mouthpiece in your mouth and seal your lips tightly around it. Breathe in slowly and as deeply as possible, raising the piston or the ball toward the top of the column. Hold your breath for 3-5 seconds or for as long  as possible. Allow the piston or ball to fall to the bottom of the column. Remove the mouthpiece from your mouth and breathe out normally. Rest for a few seconds and repeat Steps 1 through 7 at least  10 times every 1-2 hours when you are awake. Take your time and take a few normal breaths between deep breaths. The spirometer may include an indicator to show your best effort. Use the indicator as a goal to work toward during each repetition. After each set of 10 deep breaths, practice coughing to be sure your lungs are clear. If you have an incision (the cut made at the time of surgery), support your incision when coughing by placing a pillow or rolled up towels firmly against it. Once you are able to get out of bed, walk around indoors and cough well. You may stop using the incentive spirometer when instructed by your caregiver.  RISKS AND COMPLICATIONS Take your time so you do not get dizzy or light-headed. If you are in pain, you may need to take or ask for pain medication before doing incentive spirometry. It is harder to take a deep breath if you are having pain. AFTER USE Rest and breathe slowly and easily. It can be helpful to keep track of a log of your progress. Your caregiver can provide you with a simple table to help with this. If you are using the spirometer at home, follow these instructions: [place] IF:  You are having difficultly using the spirometer. You have trouble using the spirometer as often as instructed. Your pain medication is not giving enough relief while using the spirometer. You develop fever of 100.5 F (38.1 C) or higher. SEEK IMMEDIATE MEDICAL CARE IF:  You cough up bloody sputum that had not been present before. You develop fever of 102 F (38.9 C) or greater. You develop worsening pain at or near the incision site. MAKE SURE YOU:  Understand these instructions. Will watch your condition. Will get help right away if you are not doing well or get  worse. Document Released: 04/30/2006 Document Revised: 03/12/2011 Document Reviewed: 07/01/2006 [facility] Patient Information 2014 [place], [place].   ________________________________________________________________________

## 2022-01-04 NOTE — Progress Notes (Signed)
Anesthesia Review:  PCP: Cardiologist : Chest x-ray : EKG : Echo : Stress test: Cardiac Cath :  Activity level:  Sleep Study/ CPAP : Fasting Blood Sugar :      / Checks Blood Sugar -- times a day:   Blood Thinner/ Instructions /Last Dose: ASA / Instructions/ Last Dose :  

## 2022-01-08 NOTE — Care Plan (Signed)
Ortho Bundle Case Management Note  Patient Details  Name: Alexandra Hoffman MRN: [id number] Date of Birth: January 18, 1950   met with patient in the office for H&P. she will discharge to sister's home for assist. rolling walker and CPM ordered for home OPPT set up with [facility]. discharge instructions discussed and questions answered. appointments confirmed Sister = [address]          [place], [place] [address]                  DME Arranged:  [facility] DME Agency:  [employer]  HH Arranged:    [place] Agency:     Additional Comments: Please contact me with any questions of if this plan should need to change.  Ladell Heads,  [place] [facility]opaedic Specialist  [phone] 01/08/2022, 11:58 AM

## 2022-01-08 NOTE — Progress Notes (Signed)
[facility] Note  01/09/2022     CHIEF COMPLAINT Patient presents for Retina Follow Up   HISTORY OF PRESENT ILLNESS: Alexandra Hoffman is a 72 y.o. female who presents to the clinic today for:   HPI     Retina Follow Up   Patient presents with  Other.  In both eyes.  This started 6 months ago.  I, the attending physician,  performed the HPI with the patient and updated documentation appropriately.        Comments   Patient here for 6 months retina follow up for retinoschisis. Patient states vision OD is a little blurred at times. New onset flashes/floaters OD since Saturday, 01.06.24. Has pressure OD. OS seems to be ok. Still has floaters. Don't see all the electric lights going on. Uses AT's QD OU.      Last edited by [name], MD on 01/09/2022 12:55 PM.    Pt states    Referring physician: Madelin Headings, DO [address]  [place], [place] [address]  HISTORICAL INFORMATION:   Selected notes from the MEDICAL RECORD NUMBER Referred by Dr. Jorja Loa LEE: 01/09/2021 Ocular Hx- Cataracts, DES OU, RD OS    CURRENT MEDICATIONS: Current Outpatient Medications (Ophthalmic Drugs)  Medication Sig   carboxymethylcellulose (REFRESH PLUS) 0.5 % SOLN Place 1 drop into both eyes daily.   No current facility-administered medications for this visit. (Ophthalmic Drugs)   Current Outpatient Medications (Other)  Medication Sig   ALPRAZolam (XANAX) 0.5 MG tablet Take 0.5 mg by mouth at bedtime.   DULoxetine (CYMBALTA) 30 MG capsule Take 1 capsule (30 mg total) by mouth daily.   gabapentin (NEURONTIN) 100 MG capsule Take 100 mg by mouth 3 (three) times daily.   levocetirizine (XYZAL) 5 MG tablet Take 5 mg by mouth at bedtime.   linaclotide (LINZESS) 72 MCG capsule TAKE ONE CAPSULE BY MOUTH ONCE DAILY WITH BREAKFAST. (Patient taking differently: Take 72 mcg by mouth 2 (two) times a week.)   lisinopril (PRINIVIL,ZESTRIL) 10 MG tablet Take 10 mg by mouth in the  morning.   Omega-3 Fatty Acids (FISH OIL) 1200 MG CAPS Take 1,200 mg by mouth in the morning.   pantoprazole (PROTONIX) 40 MG tablet TAKE ONE TABLET BY MOUTH ONCE DAILY BEFORE BREAKFAST.   vitamin B-12 (CYANOCOBALAMIN) 1000 MCG tablet Take 1,000 mcg by mouth in the morning.   amoxicillin-clavulanate (AUGMENTIN) 875-125 MG tablet Take 1 tablet by mouth 2 (two) times daily.   No current facility-administered medications for this visit. (Other)   REVIEW OF SYSTEMS: ROS   Positive for: Gastrointestinal, Musculoskeletal, Eyes, Psychiatric Negative for: Constitutional, Neurological, Skin, Genitourinary, HENT, Endocrine, Cardiovascular, Respiratory, Allergic/Imm, Heme/Lymph Last edited by Theodore Demark, COA on 01/09/2022  9:42 AM.     ALLERGIES No Known Allergies  PAST MEDICAL HISTORY Past Medical History:  Diagnosis Date   Anxiety    Arthritis    knees   Constipation    Depression    GERD (gastroesophageal reflux disease)    Hyperlipidemia 06/12/2020   Hypertension    Past Surgical History:  Procedure Laterality Date   COLON SURGERY  2006   perforated sigmoid diverticulitis, Dr Arnoldo Morale   COLONOSCOPY  2006   Dr. Gala Romney: normal rectum and a few sigmoid diverticula, 7 mm angry pedunculated polyp at 30 cm, 4 mm sessile polyp base of cecum. Unknown path    COLONOSCOPY WITH PROPOFOL N/A 03/04/2017   Procedure: COLONOSCOPY WITH PROPOFOL;  Surgeon: Daneil Dolin,  MD;  [facility]: [facility];  Service: Endoscopy;  Laterality: N/A;  7:30am   COLONOSCOPY WITH PROPOFOL N/A 04/07/2020   Procedure: COLONOSCOPY WITH PROPOFOL;  Surgeon: Daneil Dolin, MD;  Location: [facility];  Service: Endoscopy;  Laterality: N/A;  AM (wants early as possible due to nausea)   ESOPHAGOGASTRODUODENOSCOPY (EGD) WITH PROPOFOL N/A 03/19/2019   Rourk: Gastric polyps (biopsy showed fundic gland), small hiatal hernia.   POLYPECTOMY  03/04/2017   Procedure: POLYPECTOMY;  Surgeon: Daneil Dolin, MD;  Location: [facility];  Service: Endoscopy;;  polyp at cecum and left colon   POLYPECTOMY  03/19/2019   Procedure: POLYPECTOMY;  Surgeon: Daneil Dolin, MD;  [facility]: [facility];  Service: Endoscopy;;  gastric   POLYPECTOMY  04/07/2020   Procedure: POLYPECTOMY;  Surgeon: Daneil Dolin, MD;  [facility]: [facility];  Service: Endoscopy;;   FAMILY HISTORY Family History  Problem Relation Age of Onset   Arthritis Mother    Depression Mother    Diabetes Mother    Heart disease Mother    Hypertension Mother    Stroke Mother    Kidney disease Mother 51       dialysis   Alcohol abuse Father    Arthritis Father    Cancer Father 55       brain   Hypertension Father    Hypertension Sister    Arthritis Sister    Hypertension Brother    Arthritis Brother    Hypertension Brother    Arthritis Brother    Hypertension Brother    Hypertension Brother    Hypertension Brother    Hypertension Brother    Other Brother        MVA   Colon cancer Neg Hx    SOCIAL HISTORY Social History   Tobacco Use   Smoking status: Never   Smokeless tobacco: Never  Vaping Use   Vaping Use: Never used  Substance Use Topics   Alcohol use: No   Drug use: No       OPHTHALMIC EXAM:  Base Eye Exam     Visual Acuity (Snellen - Linear)       Right Left   Dist cc 20/30 20/20   Dist ph cc 20/20 -2     Correction: Glasses         Tonometry (Tonopen, 9:37 AM)       Right Left   Pressure 16 15         Pupils       Dark Light Shape React APD   Right 3 2 Round Brisk None   Left 3 2 Round Brisk None         Visual Fields (Counting fingers)       Left Right    Full Full         Extraocular Movement       Right Left    Full, Ortho Full, Ortho         Neuro/Psych     Oriented x3: Yes   Mood/Affect: Normal         Dilation     Both eyes: 1.0% Mydriacyl, 2.5% Phenylephrine @ 9:37 AM           Slit Lamp and Fundus Exam     Slit Lamp Exam       Right Left    Lids/Lashes Dermatochalasis - upper lid Dermatochalasis - upper lid   Conjunctiva/Sclera small nasal and temporal pinguecula small  temporal pinguecula, no injection   Cornea arcus, 1+ fine Punctate epithelial erosions, tear film debris arcus, 1+fine Punctate epithelial erosions   Anterior Chamber deep, clear, narrow angles deep, clear, narrow angles   Iris Round and reactive Round and dilated   Lens 2+ Nuclear sclerosis, 2+ Cortical cataract 2+ Nuclear sclerosis, 2-3+ Cortical cataract   Anterior Vitreous Mild Vitreous syneresis, PVD, Mariel Kansky Ring Mild Vitreous syneresis, PVD, Weiss ring, vitreous condensations - improved         Fundus Exam       Right Left   Disc Pink and Sharp Pink and Sharp, mild tilt   C/D Ratio 0.5 0.3   Macula Flat, good foveal reflex, RPE mottling, No heme or edema Flat, good foveal reflex, mild RPE mottling, No heme or edema   Vessels mild attenuation, mild tortuosity, mild Copper wiring attenuated, copper wiring, Tortuous   Periphery focal retinal tear at 1100 with SRF -- focal RD -- good laser changes from 0900-1130, focal schisis cavity at 0730 -- very tiny, no outer retinal hole, mild reticular degeneration, pigmented cystoid degeneration, no new RT/RD or lattice Attached, bullous schisis cavity from 0100-0200 periphery, shallow schisis cavity IT periphery w/ peripheral outer retinal hole -- good laser changes surrounding, reticular degeneration, no new RT/RD or lattice, schisis           Refraction     Wearing Rx       Sphere Cylinder Axis Add   Right +1.25 +0.25 060 +2.25   Left +1.25 Sphere  +2.25           IMAGING AND PROCEDURES  Imaging and Procedures for 01/09/2022  OCT, Retina - OU - Both Eyes       Right Eye Quality was good. Central Foveal Thickness: 257. Progression has been stable. Findings include normal foveal contour, no IRF, no SRF (Interval release of VMA to full PVD; Trace vit opacities; Shallow SRF ST periphery caught on  widefield - not imaged today).   Left Eye Quality was good. Central Foveal Thickness: 268. Progression has been stable. Findings include normal foveal contour, no IRF, no SRF, subretinal fluid, vitreomacular adhesion (stable release of VMA to full PVD, vitreous opacities - improved, bullous retinoschisis ST and IT periphery caught on widefield ).   Notes *Images captured and stored on drive  Diagnosis / Impression:  OD: NFP; no IRF/SRF centrally; Interval release of VMA to full PVD; Trace vit opacities; Shallow SRF ST periphery caught on widefield - not imaged today OS: stable release of VMA to full PVD, vitreous opacities - improved, bullous retinoschisis ST and IT periphery caught on widefield   Clinical management:  See below  Abbreviations: NFP - Normal foveal profile. CME - cystoid macular edema. PED - pigment epithelial detachment. IRF - intraretinal fluid. SRF - subretinal fluid. EZ - ellipsoid zone. ERM - epiretinal membrane. ORA - outer retinal atrophy. ORT - outer retinal tubulation. SRHM - subretinal hyper-reflective material. IRHM - intraretinal hyper-reflective material            ASSESSMENT/PLAN:    ICD-10-CM   1. Bilateral retinoschisis  H33.103     2. Retinal hole of both eyes  H33.323     3. Right retinal detachment  H33.21     4. Posterior vitreous detachment of both eyes  H43.813     5. Essential hypertension  I10     6. Hypertensive retinopathy of both eyes  H35.033 OCT, Retina - OU - Both Eyes    7.  Combined forms of age-related cataract of both eyes  H25.813      1. Retinoschisis OU  - OD w/ shallow schisis cavity IT periphery  - OS w/ bullous retinoschisis ST periphery (0100-0200) and shallow schisis IT periphery w/ peripheral outer retinal hole  - asymptomatic, BCVA 20/25 OD, 20/20 OS  - OS lesion found during routine exam by Dr. Jorja Loa  - s/p laser retinopexy OS (01.13.23) to patches of schisis -- good laser changes in place surrounding  schisis  - no treatment recommended for shallow schisis OD at this time---monitor  - f/u in 4-6 mos, sooner prn -- DFE/OCT  2,3. Retinal hole w/ focal retinal detachment OD  - new retinal hole at 11 w/ mild heme and surrounding shallow SRF / RD from 09-1128 noted 4.7.23  - asymptomatic  - s/p laser retinopexy OD 04.07.23 -- good laser changes surrounding - no new RT/RD OD - f/u 4-6 months DFE, OCT  4. PVD / vitreous syneresis OU  - acute symptomatic FOL and floater OD--onset 01/06/22  - acute symptomatic flashes / floaters OS -- onset 06.04.23  - Discussed findings and prognosis  - No RT or RD on 360 scleral depressed exam OD  - Reviewed s/s of RT/RD  - Strict return precautions for any such RT/RD signs/symptoms  - f/u in 4-6 weeks sooner PRN -- DFE/OCT  5,6. Hypertensive retinopathy OU - discussed importance of tight BP control - monitor  7. Mixed Cataract OU - The symptoms of cataract, surgical options, and treatments and risks were discussed with patient. - discussed diagnosis and progression - not yet visually significant - monitor for now  Ophthalmic Meds Ordered this visit:  No orders of the defined types were placed in this encounter.    Return for 4-6 weeks PVD OD; DFE, OCT.  There are no Patient Instructions on file for this visit.   Explained the diagnoses, plan, and follow up with the patient and they expressed understanding.  Patient expressed understanding of the importance of proper follow up care.    This document serves as a record of services personally performed by Gardiner Sleeper, MD, PhD. It was created on their behalf by Roselee Nova, COMT. The creation of this record is the provider's dictation and/or activities during the visit.  Electronically signed by: Roselee Nova, COMT 01/09/22 12:56 PM  This document serves as a record of services personally performed by Gardiner Sleeper, MD, PhD. It was created on their behalf by Orvan Falconer, an ophthalmic  technician. The creation of this record is the provider's dictation and/or activities during the visit.    Electronically signed by: Orvan Falconer, OA, 01/09/22  12:56 PM   Gardiner Sleeper, M.D., Ph.D. Diseases & Surgery of the Retina and Vitreous [facility]  I have reviewed the above documentation for accuracy and completeness, and I agree with the above. Gardiner Sleeper, M.D., Ph.D. 01/09/22 12:57 PM  Abbreviations: M myopia (nearsighted); A astigmatism; H hyperopia (farsighted); P presbyopia; Mrx spectacle prescription;  CTL contact lenses; OD right eye; OS left eye; OU both eyes  XT exotropia; ET esotropia; PEK punctate epithelial keratitis; PEE punctate epithelial erosions; DES dry eye syndrome; MGD meibomian gland dysfunction; ATs artificial tears; PFAT's preservative free artificial tears; [place] nuclear sclerotic cataract; PSC posterior subcapsular cataract; ERM epi-retinal membrane; PVD posterior vitreous detachment; RD retinal detachment; DM diabetes mellitus; DR diabetic retinopathy; NPDR non-proliferative diabetic retinopathy; PDR proliferative diabetic retinopathy; CSME clinically significant macular edema; DME  diabetic macular edema; dbh dot blot hemorrhages; CWS cotton wool spot; POAG primary open angle glaucoma; C/D cup-to-disc ratio; HVF humphrey visual field; GVF goldmann visual field; OCT optical coherence tomography; IOP intraocular pressure; BRVO Branch retinal vein occlusion; CRVO central retinal vein occlusion; CRAO central retinal artery occlusion; BRAO branch retinal artery occlusion; RT retinal tear; SB scleral buckle; PPV pars plana vitrectomy; VH Vitreous hemorrhage; PRP panretinal laser photocoagulation; IVK intravitreal kenalog; VMT vitreomacular traction; MH Macular hole;  NVD neovascularization of the disc; NVE neovascularization elsewhere; AREDS age related eye disease study; ARMD age related macular degeneration; POAG primary open angle glaucoma;  EBMD epithelial/anterior basement membrane dystrophy; ACIOL anterior chamber intraocular lens; IOL intraocular lens; PCIOL posterior chamber intraocular lens; Phaco/IOL phacoemulsification with intraocular lens placement; [place] photorefractive keratectomy; LASIK laser assisted in situ keratomileusis; HTN hypertension; DM diabetes mellitus; COPD chronic obstructive pulmonary disease

## 2022-01-09 ENCOUNTER — Ambulatory Visit (INDEPENDENT_AMBULATORY_CARE_PROVIDER_SITE_OTHER): Payer: 59 | Admitting: Ophthalmology

## 2022-01-09 ENCOUNTER — Encounter (INDEPENDENT_AMBULATORY_CARE_PROVIDER_SITE_OTHER): Payer: Self-pay | Admitting: Ophthalmology

## 2022-01-09 ENCOUNTER — Encounter (HOSPITAL_COMMUNITY): Payer: Self-pay

## 2022-01-09 ENCOUNTER — Other Ambulatory Visit: Payer: Self-pay

## 2022-01-09 ENCOUNTER — Encounter (HOSPITAL_COMMUNITY)
Admission: RE | Admit: 2022-01-09 | Discharge: 2022-01-09 | Disposition: A | Discharge status: Home / Self Care | Payer: 59 | Source: Ambulatory Visit | Attending: Orthopedic Surgery | Admitting: Orthopedic Surgery

## 2022-01-09 VITALS — BP 159/81 | HR 79 | Temp 98.1°F | Resp 16 | Ht 64.0 in | Wt 200.8 lb

## 2022-01-09 DIAGNOSIS — H3321 Serous retinal detachment, right eye: Secondary | ICD-10-CM | POA: Diagnosis not present

## 2022-01-09 DIAGNOSIS — H35033 Hypertensive retinopathy, bilateral: Secondary | ICD-10-CM

## 2022-01-09 DIAGNOSIS — H43813 Vitreous degeneration, bilateral: Secondary | ICD-10-CM

## 2022-01-09 DIAGNOSIS — H33323 Round hole, bilateral: Secondary | ICD-10-CM | POA: Diagnosis not present

## 2022-01-09 DIAGNOSIS — H43812 Vitreous degeneration, left eye: Secondary | ICD-10-CM

## 2022-01-09 DIAGNOSIS — Z01818 Encounter for other preprocedural examination: Secondary | ICD-10-CM | POA: Diagnosis not present

## 2022-01-09 DIAGNOSIS — H33103 Unspecified retinoschisis, bilateral: Secondary | ICD-10-CM

## 2022-01-09 DIAGNOSIS — I10 Essential (primary) hypertension: Secondary | ICD-10-CM

## 2022-01-09 DIAGNOSIS — H25813 Combined forms of age-related cataract, bilateral: Secondary | ICD-10-CM

## 2022-01-09 HISTORY — DX: Anemia, unspecified: D64.9

## 2022-01-09 LAB — BASIC METABOLIC PANEL
Anion gap: 8 (ref 5–15)
BUN: 17 mg/dL (ref 8–23)
CO2: 25 mmol/L (ref 22–32)
Calcium: 9 mg/dL (ref 8.9–10.3)
Chloride: 107 mmol/L (ref 98–111)
Creatinine, Ser: 0.98 mg/dL (ref 0.44–1.00)
GFR, Estimated: >60 mL/min (ref >60)
Glucose, Bld: 90 mg/dL (ref 70–99)
Potassium: 3.8 mmol/L (ref 3.5–5.1)
Sodium: 140 mmol/L (ref 135–145)

## 2022-01-09 LAB — CBC
HCT: 42.1 % (ref 36.0–46.0)
Hemoglobin: 13.8 g/dL (ref 12.0–15.0)
MCH: 29.2 pg (ref 26.0–34.0)
MCHC: 32.8 g/dL (ref 30.0–36.0)
MCV: 89 fL (ref 80.0–100.0)
Platelets: 198 10*3/uL (ref 150–400)
RBC: 4.73 MIL/uL (ref 3.87–5.11)
RDW: 13.2 % (ref 11.5–15.5)
WBC: 5.1 10*3/uL (ref 4.0–10.5)
nRBC: 0 % (ref 0.0–0.2)

## 2022-01-09 LAB — SURGICAL PCR SCREEN
MRSA, PCR: NEGATIVE
Staphylococcus aureus: NEGATIVE

## 2022-01-09 NOTE — Progress Notes (Signed)
COVID Vaccine Completed:  Yes  Date of COVID positive in last 90 days:  No  PCP - Allyn Kenner, MD Cardiologist - N/A  Chest x-ray - N/A EKG - at PCP Stress Test - N/A ECHO - N/A Cardiac Cath - N/A Pacemaker/ICD device last checked: Spinal Cord Stimulator:N/A  Bowel Prep - N/A  Sleep Study - N/A CPAP -   Fasting Blood Sugar - N/A Checks Blood Sugar _____ times a day  Last dose of GLP1 agonist-  N/A GLP1 instructions:  N/A   Last dose of SGLT-2 inhibitors-  N/A SGLT-2 instructions: N/A  Blood Thinner Instructions: Aspirin Instructions: Last Dose:  Activity level:  Can go up a flight of stairs and perform activities of daily living without stopping and without symptoms of chest pain or shortness of breath.  Anesthesia review: N/A  Patient denies shortness of breath, fever, cough and chest pain at PAT appointment  Patient verbalized understanding of instructions that were given to them at the PAT appointment. Patient was also instructed that they will need to review over the PAT instructions again at home before surgery.

## 2022-01-17 ENCOUNTER — Encounter (INDEPENDENT_AMBULATORY_CARE_PROVIDER_SITE_OTHER): Payer: 59 | Admitting: Ophthalmology

## 2022-01-20 NOTE — Anesthesia Preprocedure Evaluation (Addendum)
Anesthesia Evaluation  Patient identified by MRN, date of birth, ID band Patient awake    Reviewed: Allergy & Precautions, NPO status , Patient's Chart, lab work & pertinent test results  History of Anesthesia Complications Negative for: history of anesthetic complications  Airway Mallampati: II  TM Distance: >3 FB Neck ROM: Full    Dental no notable dental hx. (+) Dental Advisory Given   Pulmonary neg pulmonary ROS   Pulmonary exam normal        Cardiovascular hypertension, Pt. on medications Normal cardiovascular exam     Neuro/Psych  PSYCHIATRIC DISORDERS Anxiety Depression    negative neurological ROS     GI/Hepatic Neg liver ROS,GERD  Medicated,,  Endo/Other  negative endocrine ROS    Renal/GU negative Renal ROS     Musculoskeletal  (+) Arthritis ,    Abdominal   Peds  Hematology negative hematology ROS (+)   Anesthesia Other Findings   Reproductive/Obstetrics                             Anesthesia Physical Anesthesia Plan  ASA: 2  Anesthesia Plan: Spinal and MAC   Post-op Pain Management: Regional block* and Tylenol PO (pre-op)*   Induction:   PONV Risk Score and Plan: 2 and Ondansetron, Propofol infusion and Dexamethasone  Airway Management Planned: Natural Airway  Additional Equipment:   Intra-op Plan:   Post-operative Plan:   Informed Consent: I have reviewed the patients History and Physical, chart, labs and discussed the procedure including the risks, benefits and alternatives for the proposed anesthesia with the patient or authorized representative who has indicated his/her understanding and acceptance.     Dental advisory given  Plan Discussed with: Anesthesiologist and CRNA  Anesthesia Plan Comments:         Anesthesia Quick Evaluation

## 2022-01-22 ENCOUNTER — Other Ambulatory Visit: Payer: Self-pay

## 2022-01-22 ENCOUNTER — Ambulatory Visit (HOSPITAL_BASED_OUTPATIENT_CLINIC_OR_DEPARTMENT_OTHER): Payer: 59 | Admitting: Anesthesiology

## 2022-01-22 ENCOUNTER — Encounter (HOSPITAL_COMMUNITY): Payer: Self-pay | Admitting: Orthopedic Surgery

## 2022-01-22 ENCOUNTER — Encounter (HOSPITAL_COMMUNITY)
Admission: RE | Disposition: A | Discharge status: Home / Self Care | Payer: Self-pay | Source: Ambulatory Visit | Attending: Orthopedic Surgery

## 2022-01-22 ENCOUNTER — Ambulatory Visit (HOSPITAL_COMMUNITY): Payer: 59 | Admitting: Anesthesiology

## 2022-01-22 ENCOUNTER — Ambulatory Visit (HOSPITAL_COMMUNITY): Payer: 59

## 2022-01-22 ENCOUNTER — Ambulatory Visit (HOSPITAL_COMMUNITY)
Admission: RE | Admit: 2022-01-22 | Discharge: 2022-01-22 | Disposition: A | Discharge status: Home / Self Care | Payer: 59 | Source: Ambulatory Visit | Attending: Orthopedic Surgery | Admitting: Orthopedic Surgery

## 2022-01-22 DIAGNOSIS — K219 Gastro-esophageal reflux disease without esophagitis: Secondary | ICD-10-CM | POA: Insufficient documentation

## 2022-01-22 DIAGNOSIS — M21062 Valgus deformity, not elsewhere classified, left knee: Secondary | ICD-10-CM

## 2022-01-22 DIAGNOSIS — I10 Essential (primary) hypertension: Secondary | ICD-10-CM | POA: Insufficient documentation

## 2022-01-22 DIAGNOSIS — Z780 Asymptomatic menopausal state: Secondary | ICD-10-CM | POA: Diagnosis not present

## 2022-01-22 DIAGNOSIS — M1712 Unilateral primary osteoarthritis, left knee: Secondary | ICD-10-CM

## 2022-01-22 DIAGNOSIS — R262 Difficulty in walking, not elsewhere classified: Secondary | ICD-10-CM | POA: Insufficient documentation

## 2022-01-22 HISTORY — PX: TOTAL KNEE ARTHROPLASTY: SHX125

## 2022-01-22 SURGERY — ARTHROPLASTY, KNEE, TOTAL
Anesthesia: Monitor Anesthesia Care | Site: Knee | Laterality: Left

## 2022-01-22 MED ORDER — TRANEXAMIC ACID-NACL 1000-0.7 MG/100ML-% IV SOLN
1000.0000 mg | INTRAVENOUS | Status: AC
  Administered 2022-01-22: 1000 mg via INTRAVENOUS
  Filled 2022-01-22: qty 100

## 2022-01-22 MED ORDER — ORAL CARE MOUTH RINSE: 15.0000 mL | Freq: Once | OROMUCOSAL | Status: AC

## 2022-01-22 MED ORDER — BUPIVACAINE LIPOSOME 1.3 % IJ SUSP
INTRAMUSCULAR | Status: AC
  Filled 2022-01-22: qty 20

## 2022-01-22 MED ORDER — SODIUM CHLORIDE 0.9 % IR SOLN
Status: DC | PRN
  Administered 2022-01-22: 1000 mL
  Administered 2022-01-22: 3000 mL

## 2022-01-22 MED ORDER — LACTATED RINGERS IV BOLUS
250.0000 mL | Freq: Once | INTRAVENOUS | Status: AC
  Administered 2022-01-22: 250 mL via INTRAVENOUS

## 2022-01-22 MED ORDER — PROPOFOL 10 MG/ML IV BOLUS
INTRAVENOUS | Status: DC | PRN
  Administered 2022-01-22 (×2): 30 mg via INTRAVENOUS

## 2022-01-22 MED ORDER — DEXAMETHASONE SODIUM PHOSPHATE 10 MG/ML IJ SOLN
INTRAMUSCULAR | Status: DC | PRN
  Administered 2022-01-22: 5 mg

## 2022-01-22 MED ORDER — ROPIVACAINE HCL 7.5 MG/ML IJ SOLN
INTRAMUSCULAR | Status: DC | PRN
  Administered 2022-01-22: 20 mL via PERINEURAL

## 2022-01-22 MED ORDER — METHOCARBAMOL 500 MG PO TABS
500.0000 mg | ORAL_TABLET | Freq: Four times a day (QID) | ORAL | Status: DC | PRN
  Administered 2022-01-22: 500 mg via ORAL

## 2022-01-22 MED ORDER — DEXAMETHASONE SODIUM PHOSPHATE 4 MG/ML IJ SOLN
INTRAMUSCULAR | Status: DC | PRN
  Administered 2022-01-22: 8 mg via INTRAVENOUS

## 2022-01-22 MED ORDER — SODIUM CHLORIDE 0.9% FLUSH
INTRAVENOUS | Status: DC | PRN
  Administered 2022-01-22: 50 mL via INTRAVENOUS
  Administered 2022-01-22: 10 mL

## 2022-01-22 MED ORDER — FENTANYL CITRATE (PF) 100 MCG/2ML IJ SOLN
INTRAMUSCULAR | Status: DC | PRN
  Administered 2022-01-22 (×2): 50 ug via INTRAVENOUS

## 2022-01-22 MED ORDER — KETOROLAC TROMETHAMINE 15 MG/ML IJ SOLN
7.5000 mg | Freq: Four times a day (QID) | INTRAMUSCULAR | Status: DC
  Administered 2022-01-22: 7.5 mg via INTRAVENOUS

## 2022-01-22 MED ORDER — ACETAMINOPHEN 500 MG PO TABS: 1000.0000 mg | ORAL_TABLET | Freq: Once | ORAL | Status: DC

## 2022-01-22 MED ORDER — METHOCARBAMOL 500 MG PO TABS
ORAL_TABLET | ORAL | Status: AC
  Filled 2022-01-22: qty 1

## 2022-01-22 MED ORDER — POVIDONE-IODINE 10 % EX SWAB: 2.0000 | Freq: Once | CUTANEOUS | Status: DC

## 2022-01-22 MED ORDER — ACETAMINOPHEN 500 MG PO TABS
1000.0000 mg | ORAL_TABLET | Freq: Once | ORAL | Status: AC
  Administered 2022-01-22: 1000 mg via ORAL
  Filled 2022-01-22: qty 2

## 2022-01-22 MED ORDER — OXYCODONE HCL 5 MG PO TABS
ORAL_TABLET | ORAL | Status: AC
  Filled 2022-01-22: qty 1

## 2022-01-22 MED ORDER — AMISULPRIDE (ANTIEMETIC) 5 MG/2ML IV SOLN: 10.0000 mg | Freq: Once | INTRAVENOUS | Status: DC | PRN

## 2022-01-22 MED ORDER — CELECOXIB 100 MG PO CAPS: 100.0000 mg | ORAL_CAPSULE | Freq: Two times a day (BID) | ORAL | 0 refills | Status: AC

## 2022-01-22 MED ORDER — PROMETHAZINE HCL 25 MG/ML IJ SOLN: 6.2500 mg | INTRAMUSCULAR | Status: DC | PRN

## 2022-01-22 MED ORDER — CEFAZOLIN SODIUM-DEXTROSE 2-4 GM/100ML-% IV SOLN: 2.0000 g | Freq: Four times a day (QID) | INTRAVENOUS | Status: DC

## 2022-01-22 MED ORDER — OXYCODONE HCL 5 MG PO TABS
5.0000 mg | ORAL_TABLET | ORAL | Status: DC | PRN
  Administered 2022-01-22: 5 mg via ORAL

## 2022-01-22 MED ORDER — CEFAZOLIN SODIUM-DEXTROSE 2-4 GM/100ML-% IV SOLN
2.0000 g | INTRAVENOUS | Status: AC
  Administered 2022-01-22: 2 g via INTRAVENOUS
  Filled 2022-01-22: qty 100

## 2022-01-22 MED ORDER — ONDANSETRON HCL 4 MG/2ML IJ SOLN
INTRAMUSCULAR | Status: DC | PRN
  Administered 2022-01-22: 4 mg via INTRAVENOUS

## 2022-01-22 MED ORDER — METHOCARBAMOL 500 MG PO TABS: 500.0000 mg | ORAL_TABLET | Freq: Three times a day (TID) | ORAL | 0 refills | Status: AC | PRN

## 2022-01-22 MED ORDER — CELECOXIB 200 MG PO CAPS
400.0000 mg | ORAL_CAPSULE | Freq: Once | ORAL | Status: AC
  Administered 2022-01-22: 400 mg via ORAL
  Filled 2022-01-22: qty 2

## 2022-01-22 MED ORDER — 0.9 % SODIUM CHLORIDE (POUR BTL) OPTIME
TOPICAL | Status: DC | PRN
  Administered 2022-01-22: 1000 mL

## 2022-01-22 MED ORDER — ISOPROPYL ALCOHOL 70 % SOLN
Status: AC
  Filled 2022-01-22: qty 480

## 2022-01-22 MED ORDER — ONDANSETRON HCL 4 MG/2ML IJ SOLN: 4.0000 mg | Freq: Four times a day (QID) | INTRAMUSCULAR | Status: DC | PRN

## 2022-01-22 MED ORDER — MIDAZOLAM HCL 2 MG/2ML IJ SOLN
INTRAMUSCULAR | Status: AC
  Filled 2022-01-22: qty 2

## 2022-01-22 MED ORDER — LACTATED RINGERS IV SOLN: INTRAVENOUS | Status: DC

## 2022-01-22 MED ORDER — LACTATED RINGERS IV BOLUS
500.0000 mL | Freq: Once | INTRAVENOUS | Status: AC
  Administered 2022-01-22: 500 mL via INTRAVENOUS

## 2022-01-22 MED ORDER — SODIUM CHLORIDE (PF) 0.9 % IJ SOLN
INTRAMUSCULAR | Status: AC
  Filled 2022-01-22: qty 50

## 2022-01-22 MED ORDER — SODIUM CHLORIDE (PF) 0.9 % IJ SOLN
INTRAMUSCULAR | Status: AC
  Filled 2022-01-22: qty 10

## 2022-01-22 MED ORDER — CHLORHEXIDINE GLUCONATE 0.12 % MT SOLN
15.0000 mL | Freq: Once | OROMUCOSAL | Status: AC
  Administered 2022-01-22: 15 mL via OROMUCOSAL

## 2022-01-22 MED ORDER — ONDANSETRON HCL 4 MG PO TABS: 4.0000 mg | ORAL_TABLET | Freq: Three times a day (TID) | ORAL | 0 refills | Status: AC | PRN

## 2022-01-22 MED ORDER — BUPIVACAINE IN DEXTROSE 0.75-8.25 % IT SOLN
INTRATHECAL | Status: DC | PRN
  Administered 2022-01-22: 1.8 mL via INTRATHECAL

## 2022-01-22 MED ORDER — ASPIRIN 81 MG PO TBEC: 81.0000 mg | DELAYED_RELEASE_TABLET | Freq: Two times a day (BID) | ORAL | 0 refills | Status: AC

## 2022-01-22 MED ORDER — ONDANSETRON HCL 4 MG PO TABS: 4.0000 mg | ORAL_TABLET | Freq: Four times a day (QID) | ORAL | Status: DC | PRN

## 2022-01-22 MED ORDER — OXYCODONE HCL 5 MG PO TABS: 5.0000 mg | ORAL_TABLET | ORAL | 0 refills | Status: AC | PRN

## 2022-01-22 MED ORDER — METHOCARBAMOL 500 MG IVPB - SIMPLE MED: 500.0000 mg | Freq: Four times a day (QID) | INTRAVENOUS | Status: DC | PRN

## 2022-01-22 MED ORDER — KETOROLAC TROMETHAMINE 15 MG/ML IJ SOLN
INTRAMUSCULAR | Status: AC
  Filled 2022-01-22: qty 1

## 2022-01-22 MED ORDER — PHENYLEPHRINE HCL (PRESSORS) 10 MG/ML IV SOLN
INTRAVENOUS | Status: DC | PRN
  Administered 2022-01-22: 80 ug via INTRAVENOUS
  Administered 2022-01-22: 160 ug via INTRAVENOUS
  Administered 2022-01-22: 80 ug via INTRAVENOUS
  Administered 2022-01-22 (×2): 160 ug via INTRAVENOUS

## 2022-01-22 MED ORDER — MIDAZOLAM HCL 5 MG/5ML IJ SOLN
INTRAMUSCULAR | Status: DC | PRN
  Administered 2022-01-22 (×2): 1 mg via INTRAVENOUS

## 2022-01-22 MED ORDER — PROPOFOL 500 MG/50ML IV EMUL
INTRAVENOUS | Status: DC | PRN
  Administered 2022-01-22: 100 ug/kg/min via INTRAVENOUS

## 2022-01-22 MED ORDER — BUPIVACAINE LIPOSOME 1.3 % IJ SUSP: 20.0000 mL | Freq: Once | INTRAMUSCULAR | Status: DC

## 2022-01-22 MED ORDER — FENTANYL CITRATE PF 50 MCG/ML IJ SOSY: 25.0000 ug | PREFILLED_SYRINGE | INTRAMUSCULAR | Status: DC | PRN

## 2022-01-22 MED ORDER — WATER FOR IRRIGATION, STERILE IR SOLN
Status: DC | PRN
  Administered 2022-01-22 (×2): 1000 mL

## 2022-01-22 MED ORDER — BUPIVACAINE LIPOSOME 1.3 % IJ SUSP
INTRAMUSCULAR | Status: DC | PRN
  Administered 2022-01-22: 20 mL

## 2022-01-22 MED ORDER — DEXAMETHASONE SODIUM PHOSPHATE 10 MG/ML IJ SOLN: 8.0000 mg | Freq: Once | INTRAMUSCULAR | Status: DC

## 2022-01-22 MED ORDER — FENTANYL CITRATE (PF) 100 MCG/2ML IJ SOLN
INTRAMUSCULAR | Status: AC
  Filled 2022-01-22: qty 2

## 2022-01-22 MED ORDER — ACETAMINOPHEN 500 MG PO TABS: 1000.0000 mg | ORAL_TABLET | Freq: Three times a day (TID) | ORAL | 0 refills | Status: AC | PRN

## 2022-01-22 SURGICAL SUPPLY — 61 items
BAG COUNTER SPONGE SURGICOUNT (BAG) IMPLANT
BLADE SAG 18X100X1.27 (BLADE) ×1 IMPLANT
BLADE SAW SAG 35X64 .89 (BLADE) ×1 IMPLANT
BNDG COHESIVE 3X5 TAN ST LF (GAUZE/BANDAGES/DRESSINGS) ×1 IMPLANT
BNDG ELASTIC 6X10 VLCR STRL LF (GAUZE/BANDAGES/DRESSINGS) ×1 IMPLANT
BOWL SMART MIX CTS (DISPOSABLE) ×1 IMPLANT
CEMENT BONE R 1X40 (Cement) IMPLANT
CEMENT BONE REFOBACIN R1X40 US (Cement) IMPLANT
CHLORAPREP W/TINT 26 (MISCELLANEOUS) ×2 IMPLANT
COMP FEM CEMT PERSONA STD SZ7 (Knees) ×1 IMPLANT
COMP MED POLY AS PERS S6-7 12 (Joint) ×1 IMPLANT
COMPONENT FEM CMT PRSN STD SZ7 (Knees) IMPLANT
COMPONENT MED PLY PERSS6-7 12 (Joint) IMPLANT
COVER SURGICAL LIGHT HANDLE (MISCELLANEOUS) ×1 IMPLANT
CUFF TOURN SGL QUICK 34 (TOURNIQUET CUFF) ×1
CUFF TRNQT CYL 34X4.125X (TOURNIQUET CUFF) ×1 IMPLANT
DERMABOND ADVANCED .7 DNX12 (GAUZE/BANDAGES/DRESSINGS) ×1 IMPLANT
DRAPE INCISE IOBAN 85X60 (DRAPES) ×1 IMPLANT
DRAPE SHEET LG 3/4 BI-LAMINATE (DRAPES) ×1 IMPLANT
DRAPE U-SHAPE 47X51 STRL (DRAPES) ×1 IMPLANT
DRESSING AQUACEL AG SP 3.5X10 (GAUZE/BANDAGES/DRESSINGS) ×1 IMPLANT
DRSG AQUACEL AG ADV 3.5X10 (GAUZE/BANDAGES/DRESSINGS) IMPLANT
DRSG AQUACEL AG SP 3.5X10 (GAUZE/BANDAGES/DRESSINGS) ×1
ELECT REM PT RETURN 15FT ADLT (MISCELLANEOUS) ×1 IMPLANT
GAUZE SPONGE 4X4 12PLY STRL (GAUZE/BANDAGES/DRESSINGS) ×1 IMPLANT
GLOVE BIO SURGEON STRL SZ 6.5 (GLOVE) ×2 IMPLANT
GLOVE BIOGEL PI IND STRL 6.5 (GLOVE) ×1 IMPLANT
GLOVE BIOGEL PI IND STRL 8 (GLOVE) ×1 IMPLANT
GLOVE SURG ORTHO 8.0 STRL STRW (GLOVE) ×2 IMPLANT
GOWN STRL REUS W/ TWL XL LVL3 (GOWN DISPOSABLE) ×2 IMPLANT
GOWN STRL REUS W/TWL XL LVL3 (GOWN DISPOSABLE) ×2
HANDPIECE INTERPULSE COAX TIP (DISPOSABLE) ×1
HDLS TROCR DRIL PIN KNEE 75 (PIN) ×1
HOLDER FOLEY CATH W/STRAP (MISCELLANEOUS) ×1 IMPLANT
HOOD PEEL AWAY T7 (MISCELLANEOUS) ×3 IMPLANT
MANIFOLD NEPTUNE II (INSTRUMENTS) ×1 IMPLANT
MARKER SKIN DUAL TIP RULER LAB (MISCELLANEOUS) ×1 IMPLANT
NS IRRIG 1000ML POUR BTL (IV SOLUTION) ×1 IMPLANT
PACK TOTAL KNEE CUSTOM (KITS) ×1 IMPLANT
PIN DRILL HDLS TROCAR 75 4PK (PIN) IMPLANT
PROTECTOR NERVE ULNAR (MISCELLANEOUS) ×1 IMPLANT
SCREW HEADED 33MM KNEE (MISCELLANEOUS) IMPLANT
SET HNDPC FAN SPRY TIP SCT (DISPOSABLE) ×1 IMPLANT
SOLUTION IRRIG SURGIPHOR (IV SOLUTION) IMPLANT
SPIKE FLUID TRANSFER (MISCELLANEOUS) ×1 IMPLANT
STEM POLY PAT PLY 32M KNEE (Knees) IMPLANT
STEM TIBIA 5 DEG SZ D L KNEE (Knees) IMPLANT
STRIP CLOSURE SKIN 1/2X4 (GAUZE/BANDAGES/DRESSINGS) ×1 IMPLANT
SUT MNCRL AB 3-0 PS2 18 (SUTURE) ×1 IMPLANT
SUT STRATAFIX 0 PDS 27 VIOLET (SUTURE) ×1
SUT STRATAFIX PDO 1 14 VIOLET (SUTURE) ×1
SUT STRATFX PDO 1 14 VIOLET (SUTURE) ×1
SUT VIC AB 2-0 CT2 27 (SUTURE) ×2 IMPLANT
SUTURE STRATFX 0 PDS 27 VIOLET (SUTURE) ×1 IMPLANT
SUTURE STRATFX PDO 1 14 VIOLET (SUTURE) ×1 IMPLANT
SYR 50ML LL SCALE MARK (SYRINGE) ×1 IMPLANT
TIBIA STEM 5 DEG SZ D L KNEE (Knees) ×1 IMPLANT
TRAY FOLEY MTR SLVR 14FR STAT (SET/KITS/TRAYS/PACK) IMPLANT
TUBE SUCTION HIGH CAP CLEAR NV (SUCTIONS) ×1 IMPLANT
UNDERPAD 30X36 HEAVY ABSORB (UNDERPADS AND DIAPERS) ×1 IMPLANT
WRAP KNEE MAXI GEL POST OP (GAUZE/BANDAGES/DRESSINGS) IMPLANT

## 2022-01-22 NOTE — Interval H&P Note (Signed)

## 2022-01-22 NOTE — Transfer of Care (Signed)
Immediate Anesthesia Transfer of Care Note  Patient: Alexandra Hoffman  Procedure(s) Performed: LEFT TOTAL KNEE ARTHROPLASTY (Left: Knee)  Patient Location: PACU  Anesthesia Type:Spinal  Level of Consciousness: awake, alert , oriented, and patient cooperative  Airway & Oxygen Therapy: Patient Spontanous Breathing and Patient connected to face mask oxygen  Post-op Assessment: Report given to RN and Post -op Vital signs reviewed and stable  Post vital signs: Reviewed and stable  Last Vitals:  Vitals Value Taken Time  BP 120/80 01/22/22 0921  Temp    Pulse 82 01/22/22 0923  Resp 15 01/22/22 0923  SpO2 100 % 01/22/22 0923  Vitals shown include unvalidated device data.  Last Pain:  Vitals:   01/22/22 0550  TempSrc: Oral         Complications: No notable events documented.

## 2022-01-22 NOTE — Discharge Instructions (Signed)
INSTRUCTIONS AFTER JOINT REPLACEMENT   Remove items at home which could result in a fall. This includes throw rugs or furniture in walking pathways ICE to the affected joint every three hours while awake for 30 minutes at a time, for at least the first 3-5 days, and then as needed for pain and swelling.  Continue to use ice for pain and swelling. You may notice swelling that will progress down to the foot and ankle.  This is normal after surgery.  Elevate your leg when you are not up walking on it.   Continue to use the breathing machine you got in the hospital (incentive spirometer) which will help keep your temperature down.  It is common for your temperature to cycle up and down following surgery, especially at night when you are not up moving around and exerting yourself.  The breathing machine keeps your lungs expanded and your temperature down.   DIET:  As you were doing prior to hospitalization, we recommend a well-balanced diet.  DRESSING / WOUND CARE / SHOWERING  Keep the surgical dressing until follow up.  The dressing is water proof, so you can shower without any extra covering.  IF THE DRESSING FALLS OFF or the wound gets wet inside, change the dressing with sterile gauze.  Please use good hand washing techniques before changing the dressing.  Do not use any lotions or creams on the incision until instructed by your surgeon.    ACTIVITY  Increase activity slowly as tolerated, but follow the weight bearing instructions below.   No driving for 6 weeks or until further direction given by your physician.  You cannot drive while taking narcotics.  No lifting or carrying greater than 10 lbs. until further directed by your surgeon. Avoid periods of inactivity such as sitting longer than an hour when not asleep. This helps prevent blood clots.  You may return to work once you are authorized by your doctor.     WEIGHT BEARING   Weight bearing as tolerated with assist device (walker, cane,  etc) as directed, use it as long as suggested by your surgeon or therapist, typically at least 4-6 weeks.   EXERCISES  Results after joint replacement surgery are often greatly improved when you follow the exercise, range of motion and muscle strengthening exercises prescribed by your doctor. Safety measures are also important to protect the joint from further injury. Any time any of these exercises cause you to have increased pain or swelling, decrease what you are doing until you are comfortable again and then slowly increase them. If you have problems or questions, call your caregiver or physical therapist for advice.   Rehabilitation is important following a joint replacement. After just a few days of immobilization, the muscles of the leg can become weakened and shrink (atrophy).  These exercises are designed to build up the tone and strength of the thigh and leg muscles and to improve motion. Often times heat used for twenty to thirty minutes before working out will loosen up your tissues and help with improving the range of motion but do not use heat for the first two weeks following surgery (sometimes heat can increase post-operative swelling).   These exercises can be done on a training (exercise) mat, on the floor, on a table or on a bed. Use whatever works the best and is most comfortable for you.    Use music or television while you are exercising so that the exercises are a pleasant break in your  day. This will make your life better with the exercises acting as a break in your routine that you can look forward to.   Perform all exercises about fifteen times, three times per day or as directed.  You should exercise both the operative leg and the other leg as well.  Exercises include:   Quad Sets - Tighten up the muscle on the front of the thigh (Quad) and hold for 5-10 seconds.   Straight Leg Raises - With your knee straight (if you were given a brace, keep it on), lift the leg to 60  degrees, hold for 3 seconds, and slowly lower the leg.  Perform this exercise against resistance later as your leg gets stronger.  Leg Slides: Lying on your back, slowly slide your foot toward your buttocks, bending your knee up off the floor (only go as far as is comfortable). Then slowly slide your foot back down until your leg is flat on the floor again.  Angel Wings: Lying on your back spread your legs to the side as far apart as you can without causing discomfort.  Hamstring Strength:  Lying on your back, push your heel against the floor with your leg straight by tightening up the muscles of your buttocks.  Repeat, but this time bend your knee to a comfortable angle, and push your heel against the floor.  You may put a pillow under the heel to make it more comfortable if necessary.   A rehabilitation program following joint replacement surgery can speed recovery and prevent re-injury in the future due to weakened muscles. Contact your doctor or a physical therapist for more information on knee rehabilitation.    CONSTIPATION  Constipation is defined medically as fewer than three stools per week and severe constipation as less than one stool per week.  Even if you have a regular bowel pattern at home, your normal regimen is likely to be disrupted due to multiple reasons following surgery.  Combination of anesthesia, postoperative narcotics, change in appetite and fluid intake all can affect your bowels.   YOU MUST use at least one of the following options; they are listed in order of increasing strength to get the job done.  They are all available over the counter, and you may need to use some, POSSIBLY even all of these options:    Drink plenty of fluids (prune juice may be helpful) and high fiber foods Colace 100 mg by mouth twice a day  Senokot for constipation as directed and as needed Dulcolax (bisacodyl), take with full glass of water  Miralax (polyethylene glycol) once or twice a day as  needed.  If you have tried all these things and are unable to have a bowel movement in the first 3-4 days after surgery call either your surgeon or your primary doctor.    If you experience loose stools or diarrhea, hold the medications until you stool forms back up.  If your symptoms do not get better within 1 week or if they get worse, check with your doctor.  If you experience "the worst abdominal pain ever" or develop nausea or vomiting, please contact the office immediately for further recommendations for treatment.   ITCHING:  If you experience itching with your medications, try taking only a single pain pill, or even half a pain pill at a time.  You can also use Benadryl over the counter for itching or also to help with sleep.   TED HOSE STOCKINGS:  Use stockings on both  legs until for at least 2 weeks or as directed by physician office. They may be removed at night for sleeping.  MEDICATIONS:  See your medication summary on the "After Visit Summary" that nursing will review with you.  You may have some home medications which will be placed on hold until you complete the course of blood thinner medication.  It is important for you to complete the blood thinner medication as prescribed.   Blood clot prevention (DVT Prophylaxis): After surgery you are at an increased risk for a blood clot. you were prescribed a blood thinner, Aspirin 81mg, to be taken twice daily for a total of 4 weeks from surgery to help reduce your risk of getting a blood clot. This will help prevent a blood clot. Signs of a pulmonary embolus (blood clot in the lungs) include sudden short of breath, feeling lightheaded or dizzy, chest pain with a deep breath, rapid pulse rapid breathing. Signs of a blood clot in your arms or legs include new unexplained swelling and cramping, warm, red or darkened skin around the painful area. Please call the office or 911 right away if these signs or symptoms develop.  PRECAUTIONS:  If you  experience chest pain or shortness of breath - call 911 immediately for transfer to the hospital emergency department.   If you develop a fever greater that 101 F, purulent drainage from wound, increased redness or drainage from wound, foul odor from the wound/dressing, or calf pain - CONTACT YOUR SURGEON.                                                   FOLLOW-UP APPOINTMENTS:  If you do not already have a post-op appointment, please call the office for an appointment to be seen by your surgeon.  Guidelines for how soon to be seen are listed in your "After Visit Summary", but are typically between 2-3 weeks after surgery.  OTHER INSTRUCTIONS:   Knee Replacement:  Do not place pillow under knee, focus on keeping the knee straight while resting.  DO NOT modify, tear, cut, or change the foam block in any way.  POST-OPERATIVE OPIOID TAPER INSTRUCTIONS: It is important to wean off of your opioid medication as soon as possible. If you do not need pain medication after your surgery it is ok to stop day one. Opioids include: Codeine, Hydrocodone(Norco, Vicodin), Oxycodone(Percocet, oxycontin) and hydromorphone amongst others.  Long term and even short term use of opiods can cause: Increased pain response Dependence Constipation Depression Respiratory depression And more.  Withdrawal symptoms can include Flu like symptoms Nausea, vomiting And more Techniques to manage these symptoms Hydrate well Eat regular healthy meals Stay active Use relaxation techniques(deep breathing, meditating, yoga) Do Not substitute Alcohol to help with tapering If you have been on opioids for less than two weeks and do not have pain than it is ok to stop all together.  Plan to wean off of opioids This plan should start within one week post op of your joint replacement. Maintain the same interval or time between taking each dose and first decrease the dose.  Cut the total daily intake of opioids by one tablet  each day Next start to increase the time between doses. The last dose that should be eliminated is the evening dose.   MAKE SURE YOU:  Understand these instructions.    Get help right away if you are not doing well or get worse.    Thank you for letting us be a part of your medical care team.  It is a privilege we respect greatly.  We hope these instructions will help you stay on track for a fast and full recovery!

## 2022-01-22 NOTE — Anesthesia Postprocedure Evaluation (Signed)
Anesthesia Post Note  Patient: [name]  Procedure(s) Performed: LEFT TOTAL KNEE ARTHROPLASTY (Left: Knee)     Patient location during evaluation: PACU Anesthesia Type: MAC and Spinal Level of consciousness: awake and alert Pain management: pain level controlled Vital Signs Assessment: post-procedure vital signs reviewed and stable Respiratory status: spontaneous breathing and respiratory function stable Cardiovascular status: blood pressure returned to baseline and stable Postop Assessment: spinal receding Anesthetic complications: no  No notable events documented.  Last Vitals:  Vitals:   01/22/22 1000 01/22/22 1006  BP: (!) 149/81 (!) 163/83  Pulse: 79 80  Resp: 15 16  Temp: 36.4 C   SpO2: 100% 100%    Last Pain:  Vitals:   01/22/22 1006  TempSrc:   PainSc: 0-No pain                 Merrillyn Ackerley DANIEL

## 2022-01-22 NOTE — Anesthesia Procedure Notes (Signed)
Spinal  Patient location during procedure: OR Start time: 01/22/2022 7:18 AM End time: 01/22/2022 7:28 AM Reason for block: surgical anesthesia Staffing Performed: anesthesiologist  Anesthesiologist: Duane Boston, MD Performed by: Duane Boston, MD Authorized by: Duane Boston, MD   Preanesthetic Checklist Completed: patient identified, IV checked, risks and benefits discussed, surgical consent, monitors and equipment checked, pre-op evaluation and timeout performed Spinal Block Patient position: sitting Prep: DuraPrep Patient monitoring: cardiac monitor, continuous pulse ox and blood pressure Approach: midline Location: L2-3 Injection technique: single-shot Needle Needle type: Pencan  Needle gauge: 24 G Needle length: 9 cm Assessment Events: CSF return and second provider Additional Notes Functioning IV was confirmed and monitors were applied. Sterile prep and drape, including hand hygiene and sterile gloves were used. The patient was positioned and the spine was prepped. The skin was anesthetized with lidocaine.  Free flow of clear CSF was obtained prior to injecting local anesthetic into the CSF.  The spinal needle aspirated freely following injection.  The needle was carefully withdrawn.  The patient tolerated the procedure well.

## 2022-01-22 NOTE — Op Note (Signed)
DATE OF SURGERY:  01/22/2022 TIME: 8:57 AM  PATIENT NAME:  Alexandra Hoffman   AGE: 72 y.o.    PRE-OPERATIVE DIAGNOSIS: End-stage left knee osteoarthritis with valgus deformity  POST-OPERATIVE DIAGNOSIS:  Same  PROCEDURE:  Left Total Knee Arthroplasty  SURGEON:  Sherell Christoffel A Othal Kubitz, MD   ASSISTANT: Izola Price, RNFA, present and scrubbed throughout the case, critical for assistance with exposure, retraction, instrumentation, and closure.   OPERATIVE IMPLANTS:  Cemented Zimmer persona size 7 standard femur, D tibia, 32 mm patella, 12 mm MC polyliner Implant Name Type Inv. Item Serial No. Manufacturer Lot No. LRB No. Used Action  CEMENT BONE R 1X40 - [id number] Cement CEMENT BONE R 1X40  ZIMMER [employer]ON(ORTH,TRAU,BIO,SG) [id number] Left 2 Implanted  COMP FEM CEMT PERSONA STD SZ7 - [id number] Knees COMP FEM CEMT PERSONA STD SZ7  ZIMMER [employer]ON(ORTH,TRAU,BIO,SG) [id number] Left 1 Implanted  STEM POLY PAT PLY 63M KNEE - [id number] Knees STEM POLY PAT PLY 63M KNEE  ZIMMER [employer]ON(ORTH,TRAU,BIO,SG) [id number] Left 1 Implanted  TIBIA STEM 5 DEG SZ D L KNEE - [id number] Knees TIBIA STEM 5 DEG SZ D L KNEE  ZIMMER [employer]ON(ORTH,TRAU,BIO,SG) [id number] Left 1 Implanted  COMP MED POLY AS PERS S6-7 12 - [id number] Joint COMP MED POLY AS PERS S6-7 12  ZIMMER [employer]ON(ORTH,TRAU,BIO,SG) [id number] Left 1 Implanted      PREOPERATIVE INDICATIONS: Alexandra Hoffman is a 72 y.o. year old female with end stage bone on bone degenerative arthritis of the knee who failed conservative treatment, including injections, antiinflammatories, activity modification, and assistive devices, and had significant impairment of their activities of daily living, and elected for Total Knee Arthroplasty.   The risks, benefits, and alternatives were discussed at length including but not limited to the risks of infection, bleeding, nerve injury, stiffness, blood clots, the need for revision surgery, cardiopulmonary complications, among others, and they  were willing to proceed.  OPERATIVE FINDINGS AND UNIQUE ASPECTS OF THE CASE: Moderate valgus deformity tight laterally and extension, partial release of the IT band laterally to help balance the knee  ESTIMATED BLOOD LOSS: 50cc  OPERATIVE DESCRIPTION:   Once adequate anesthesia was induced, preoperative antibiotics, 2 gm of Ancef,1 gm of Tranexamic Acid, and 8 mg of Decadron administered, the patient was positioned supine with a left thigh tourniquet placed.  The left lower extremity was prepped and draped in sterile fashion.  A time-  out was performed identifying the patient, planned procedure, and the appropriate extremity.     The leg was  exsanguinated, tourniquet elevated to 250 mmHg.  A midline incision was  made followed by median parapatellar arthrotomy. Anterior horn of the medial meniscus was released and resected. A medial release was performed, the infrapatellar fat pad was resected with care taken to protect the patellar tendon. The suprapatellar fat was removed to exposed the distal anterior femur. The anterior horn of the lateral meniscus and ACL were released.    Following initial  exposure, I first started with the femur  The femoral  canal was opened with a drill, canal was suctioned to try to prevent fat emboli.  An  intramedullary rod was passed set at 5 degrees valgus, 10 mm. The distal femur was resected.  Following this resection, the tibia was  subluxated anteriorly.  Using the extramedullary guide, 10 mm of bone was resected off   the tibial spine given significant wear laterally.  We confirmed the gap would be  stable medially and laterally with a size 57m spacer block as well as  confirmed that the tibial cut was perpendicular in the coronal plane, checking with an alignment rod.    Once this was done, the posterior femoral referencing femoral sizer was placed under to the posterior condyles with 3 degrees of external rotational which was parallel to the transepicondylar  axis and perpendicular to [employer]. The femur was sized to be a size 7 in the anterior-  posterior dimension. The  anterior, posterior, and  chamfer cuts were made without difficulty nor   notching making certain that I was along the anterior cortex to help  with flexion gap stability. Next a laminar spreader was placed with the knee in flexion and the medial lateral menisci were resected.  5 cc of the Exparel mixture was injected in the medial side of the back of the knee and 3 cc in the lateral side.  1/2 inch curved osteotome was used to resect posterior osteophyte that was then removed with a pituitary rongeur.       At this point, the tibia was sized to be a size D.  The size D tray was  then pinned in position. Trial reduction was now carried with a 7 femur, D tibia, a 10 mm MC insert.  The knee had slight hyperextension but was also notably tight laterally.  Upsized to an 11 mm poly and used a 15 blade to piecrust the IT band laterally.  Then upsized to a 12 mm poly which had better stability in extension the knee had full extension and was stable to varus valgus stress in extension.  The knee was slightly tight in flexion and the PCL was partially released.   Attention was next directed to the patella.  Precut  measurement was noted to be 21 mm.  I resected down to 14 mm and used a  35m patellar button to restore patellar height as well as cover the cut surface.     The patella lug holes were drilled and a 32 mm patella poly trial was placed.    The knee was brought to full extension with good flexion stability with the patella tracking through the trochlea without application of pressure.     Next the femoral component was again assessed and determined to be seated and appropriately lateralized.  The femoral lug holes were drilled.  The femoral component was then removed. Tibial component was again assessed and felt to be seated and appropriately rotated with the medial third of the  tubercle. The tibia was then drilled, and keel punched.     Final components were  opened and cement was mixed.      Final implants were then  cemented onto cleaned and dried cut surfaces of bone with the knee brought to extension with a 12 mm MC poly.  The knee was irrigated with sterile Betadine diluted in saline as well as pulse lavage normal saline. The synovial lining was  then injected a dilute Exparel.      Once the cement had fully cured, excess cement was removed throughout the knee.  I confirmed that I was satisfied with the range of motion and stability, and the final 12 mm MC poly insert was chosen.  It was placed into the knee.         The tourniquet had been let down at 59 minutes.  No significant hemostasis was required.  The medial parapatellar arthrotomy was then reapproximated using #1 Stratafix sutures with the knee  in flexion.  The remaining wound was closed  with 0 stratafix, 2-0 Vicryl, and running 3-0 Monocryl. The knee was cleaned, dried, dressed sterilely using Dermabond and   Aquacel dressing.  The patient was then brought to recovery room in stable condition, tolerating the procedure  well. There were no complications.   Post op recs: WB: WBAT Abx: ancef Imaging: PACU xrays DVT prophylaxis: Aspirin '81mg'$  BID x4 weeks Follow up: 2 weeks after surgery for a wound check with Dr. Zachery Dakins at [facility].  Address: [address], [place], [place] [address]  Office Phone: [phone]  Charlies Constable, MD Orthopaedic Surgery

## 2022-01-22 NOTE — Progress Notes (Signed)
Orthopedic Tech Progress Note Patient Details:  Alexandra Hoffman [birth year] [id number]  Patient ID: Archie Endo, female   DOB: 1950-10-21, 72 y.o.   MRN: [id number]  Alexandra Hoffman 01/22/2022, 9:52 AM Left bone foam applied in pacu

## 2022-01-22 NOTE — Evaluation (Signed)
Physical Therapy Evaluation Patient Details Name: Alexandra Hoffman MRN: [id number] DOB: Dec 09, 1950 Today's Date: 01/22/2022  History of Present Illness  Pt is a 72 yo female presenting s/p L-TKA on 01/22/22. PMH: anemia, GERD, HLD, HTN.  Clinical Impression  Alexandra Hoffman is a 72 y.o. female POD 0 s/p L-TKA. Patient reports IND with mobility at baseline. Patient is now limited by functional impairments (see PT problem list below) and requires min assist for transfers and min guard for gait with RW. Patient was able to ambulate 40 feet with RW and min guard and cues for safe walker management. Patient educated on safe sequencing for stair mobility and verbalized safe guarding position for people assisting with mobility. Patient instructed in exercises to facilitate ROM and circulation. Patient will benefit from continued skilled PT interventions to address impairments and progress towards PLOF. Patient has met mobility goals at adequate level for discharge home; will continue to follow if pt continues acute stay to progress towards Mod I goals.       Recommendations for follow up therapy are one component of a multi-disciplinary discharge planning process, led by the attending physician.  Recommendations may be updated based on patient status, additional functional criteria and insurance authorization.  Follow Up Recommendations Follow physician's recommendations for discharge plan and follow up therapies      Assistance Recommended at Discharge Frequent or constant Supervision/Assistance  Patient can return home with the following  A little help with walking and/or transfers;A little help with bathing/dressing/bathroom;Assistance with cooking/housework;Assist for transportation;Help with stairs or ramp for entrance    Equipment Recommendations None recommended by PT  Recommendations for Other Services       Functional Status Assessment Patient has had a recent decline in their functional  status and demonstrates the ability to make significant improvements in function in a reasonable and predictable amount of time.     Precautions / Restrictions Precautions Precautions: Knee;Fall Precaution Booklet Issued: No Precaution Comments: no pillow under the knee Restrictions Weight Bearing Restrictions: No Other Position/Activity Restrictions: WBAT      Mobility  Bed Mobility Overal bed mobility: Needs Assistance Bed Mobility: Supine to Sit     Supine to sit: Supervision     General bed mobility comments: For safety    Transfers Overall transfer level: Needs assistance Equipment used: Rolling walker (2 wheels) Transfers: Sit to/from Stand Sit to Stand: Min assist, From elevated surface           General transfer comment: For light lift assist, pt pulled up on PT arm.    Ambulation/Gait Ambulation/Gait assistance: Min guard Gait Distance (Feet): 40 Feet Assistive device: Rolling walker (2 wheels) Gait Pattern/deviations: Step-to pattern Gait velocity: decreased     General Gait Details: Pt ambulated with RW and min guard, no physical assist required or overt LOB noted, VCs for seqencing and reciprocal gait pattern  Stairs            Wheelchair Mobility    Modified Rankin (Stroke Patients Only)       Balance Overall balance assessment: Needs assistance Sitting-balance support: Feet supported, No upper extremity supported Sitting balance-Leahy Scale: Good     Standing balance support: Reliant on assistive device for balance, During functional activity, Bilateral upper extremity supported Standing balance-Leahy Scale: Poor                               Pertinent Vitals/Pain Pain Assessment Pain Assessment:  0-10 Pain Score: 5  Pain Location: left knee Pain Descriptors / Indicators: Operative site guarding Pain Intervention(s): Limited activity within patient's tolerance, Monitored during session, Repositioned, Ice applied     Home Living Family/patient expects to be discharged to:: Private residence Living Arrangements: Alone Available Help at Discharge: Family;Available 24 hours/day Type of Home: House Home Access: Level entry       Home Layout: One level Home Equipment: Cane - single Barista (2 wheels)      Prior Function Prior Level of Function : Independent/Modified Independent;Driving             Mobility Comments: IND ADLs Comments: IND     Hand Dominance   Dominant Hand: Right    Extremity/Trunk Assessment   Upper Extremity Assessment Upper Extremity Assessment: Overall WFL for tasks assessed    Lower Extremity Assessment Lower Extremity Assessment: RLE deficits/detail;LLE deficits/detail RLE Deficits / Details: MMT ank DF/PF 5/5 RLE Sensation: WNL LLE Deficits / Details: MMT ank DF/PF 5/5, no extensor lag noted LLE Sensation: WNL    Cervical / Trunk Assessment Cervical / Trunk Assessment: Normal  Communication   Communication: No difficulties  Cognition Arousal/Alertness: Awake/alert Behavior During Therapy: WFL for tasks assessed/performed Overall Cognitive Status: Within Functional Limits for tasks assessed                                          General Comments      Exercises Total Joint Exercises Ankle Circles/Pumps: AROM, Both, 20 reps Quad Sets: AROM, Left, Other reps (comment) (3) Short Arc Quad: AROM, Left, Other reps (comment) (3) Heel Slides: AROM, Left, Other reps (comment) (3) Hip ABduction/ADduction: AROM, Both, 5 reps Straight Leg Raises: AROM, Left, Other reps (comment) (3) Long Arc Quad: Other (comment) (educated not performed) Knee Flexion: Other (comment) (educated not performed) Goniometric ROM: -5-20deg by gross visual approximation   Assessment/Plan    PT Assessment Patient needs continued PT services  PT Problem List Decreased strength;Decreased range of motion;Decreased activity tolerance;Decreased  balance;Decreased mobility;Pain       PT Treatment Interventions DME instruction;Gait training;Stair training;Functional mobility training;Therapeutic activities;Therapeutic exercise;Balance training;Neuromuscular re-education;Patient/family education    PT Goals (Current goals can be found in the Care Plan section)  Acute Rehab PT Goals Patient Stated Goal: To play with her great-grandchildren PT Goal Formulation: With patient Time For Goal Achievement: 01/29/22 Potential to Achieve Goals: Good    Frequency 7X/week     Co-evaluation               AM-PAC PT "6 Clicks" Mobility  Outcome Measure Help needed turning from your back to your side while in a flat bed without using bedrails?: None Help needed moving from lying on your back to sitting on the side of a flat bed without using bedrails?: A Little Help needed moving to and from a bed to a chair (including a wheelchair)?: A Little Help needed standing up from a chair using your arms (e.g., wheelchair or bedside chair)?: A Little Help needed to walk in hospital room?: A Little Help needed climbing 3-5 steps with a railing? : A Little 6 Click Score: 19    End of Session Equipment Utilized During Treatment: Gait belt Activity Tolerance: Patient tolerated treatment well Patient left: in chair;with call bell/phone within reach Nurse Communication: Mobility status PT Visit Diagnosis: Pain;Difficulty in walking, not elsewhere classified (R26.2) Pain - Right/Left:  Left Pain - part of body: Knee    Time: 1025-4862 PT Time Calculation (min) (ACUTE ONLY): 29 min   Charges:   PT Evaluation $PT Eval Low Complexity: 1 Low PT Treatments $Gait Training: 8-22 mins        Coolidge Breeze, PT, DPT [facility] Department Office: [phone] Weekend pager: [phone]  Coolidge Breeze 01/22/2022, 11:49 AM

## 2022-01-22 NOTE — Anesthesia Procedure Notes (Signed)
Anesthesia Regional Block: Adductor canal block   Pre-Anesthetic Checklist: , timeout performed,  Correct Patient, Correct Site, Correct Laterality,  Correct Procedure, Correct Position, site marked,  Risks and benefits discussed,  Surgical consent,  Pre-op evaluation,  At surgeon's request and post-op pain management  Laterality: Left  Prep: chloraprep       Needles:  Injection technique: Single-shot  Needle Type: Stimulator Needle - 80     Needle Length: 10cm  Needle Gauge: 21     Additional Needles:   Narrative:  Start time: 01/22/2022 7:01 AM End time: 01/22/2022 7:11 AM Injection made incrementally with aspirations every 5 mL.  Performed by: Personally  Anesthesiologist: Duane Boston, MD

## 2022-01-23 ENCOUNTER — Encounter (HOSPITAL_COMMUNITY): Payer: Self-pay | Admitting: Orthopedic Surgery

## 2022-02-06 NOTE — Progress Notes (Signed)
[facility] Note  02/20/2022     CHIEF COMPLAINT Patient presents for Retina Follow Up   HISTORY OF PRESENT ILLNESS: Alexandra Hoffman is a 72 y.o. female who presents to the clinic today for:   HPI     Retina Follow Up   Patient presents with  Other.  In both eyes.  This started years ago.  Duration of 5 weeks.  I, the attending physician,  performed the HPI with the patient and updated documentation appropriately.        Comments   Patient feels that the vision is the same and she is still seeing lots of floaters. She is using AT's OU PRN.      Last edited by [name], MD on 02/21/2022  9:43 PM.    Pt states she is still seeing floaters, but they are getting better, no fol   Referring physician: Madelin Headings, DO [address]  [place], [place] [id number]  HISTORICAL INFORMATION:   Selected notes from the MEDICAL RECORD NUMBER Referred by Dr. Jorja Loa LEE: 01/09/2021 Ocular Hx- Cataracts, DES OU, RD OS    CURRENT MEDICATIONS: Current Outpatient Medications (Ophthalmic Drugs)  Medication Sig   carboxymethylcellulose (REFRESH PLUS) 0.5 % SOLN Place 1 drop into both eyes daily.   No current facility-administered medications for this visit. (Ophthalmic Drugs)   Current Outpatient Medications (Other)  Medication Sig   acetaminophen (TYLENOL) 500 MG tablet Take 2 tablets (1,000 mg total) by mouth every 8 (eight) hours as needed.   ALPRAZolam (XANAX) 0.5 MG tablet Take 0.5 mg by mouth at bedtime.   DULoxetine (CYMBALTA) 30 MG capsule Take 1 capsule (30 mg total) by mouth daily.   gabapentin (NEURONTIN) 100 MG capsule Take 100 mg by mouth 3 (three) times daily.   levocetirizine (XYZAL) 5 MG tablet Take 5 mg by mouth at bedtime.   linaclotide (LINZESS) 72 MCG capsule TAKE ONE CAPSULE BY MOUTH ONCE DAILY WITH BREAKFAST. (Patient taking differently: Take 72 mcg by mouth 2 (two) times a week.)   lisinopril (PRINIVIL,ZESTRIL) 10 MG tablet Take 10  mg by mouth in the morning.   Omega-3 Fatty Acids (FISH OIL) 1200 MG CAPS Take 1,200 mg by mouth in the morning.   pantoprazole (PROTONIX) 40 MG tablet TAKE ONE TABLET BY MOUTH ONCE DAILY BEFORE BREAKFAST.   vitamin B-12 (CYANOCOBALAMIN) 1000 MCG tablet Take 1,000 mcg by mouth in the morning.   No current facility-administered medications for this visit. (Other)   REVIEW OF SYSTEMS: ROS   Positive for: Gastrointestinal, Musculoskeletal, Eyes, Psychiatric Negative for: Constitutional, Neurological, Skin, Genitourinary, HENT, Endocrine, Cardiovascular, Respiratory, Allergic/Imm, Heme/Lymph Last edited by [name], COT on 02/20/2022  8:42 AM.      ALLERGIES No Known Allergies  PAST MEDICAL HISTORY Past Medical History:  Diagnosis Date   Anemia    In childhood   Anxiety    Arthritis    knees   Constipation    Depression    GERD (gastroesophageal reflux disease)    Hyperlipidemia 06/12/2020   Hypertension    Past Surgical History:  Procedure Laterality Date   COLON SURGERY  2006   perforated sigmoid diverticulitis, Dr Arnoldo Morale   COLONOSCOPY  2006   Dr. Gala Romney: normal rectum and a few sigmoid diverticula, 7 mm angry pedunculated polyp at 30 cm, 4 mm sessile polyp base of cecum. Unknown path    COLONOSCOPY WITH PROPOFOL N/A 03/04/2017   Procedure: COLONOSCOPY WITH PROPOFOL;  Surgeon: Gala Romney,  [name], MD;  Location: [facility];  Service: Endoscopy;  Laterality: N/A;  7:30am   COLONOSCOPY WITH PROPOFOL N/A 04/07/2020   Procedure: COLONOSCOPY WITH PROPOFOL;  Surgeon: Daneil Dolin, MD;  Location: [facility];  Service: Endoscopy;  Laterality: N/A;  AM (wants early as possible due to nausea)   ESOPHAGOGASTRODUODENOSCOPY (EGD) WITH PROPOFOL N/A 03/19/2019   Rourk: Gastric polyps (biopsy showed fundic gland), small hiatal hernia.   POLYPECTOMY  03/04/2017   Procedure: POLYPECTOMY;  Surgeon: Daneil Dolin, MD;  Location: [facility];  Service: Endoscopy;;  polyp at cecum  and left colon   POLYPECTOMY  03/19/2019   Procedure: POLYPECTOMY;  Surgeon: Daneil Dolin, MD;  Location: [facility];  Service: Endoscopy;;  gastric   POLYPECTOMY  04/07/2020   Procedure: POLYPECTOMY;  Surgeon: Daneil Dolin, MD;  Location: [facility];  Service: Endoscopy;;   TOTAL KNEE ARTHROPLASTY Left 01/22/2022   Procedure: LEFT TOTAL KNEE ARTHROPLASTY;  Surgeon: Willaim Sheng, MD;  Location: [facility];  Service: Orthopedics;  Laterality: Left;   FAMILY HISTORY Family History  Problem Relation Age of Onset   Arthritis Mother    Depression Mother    Diabetes Mother    Heart disease Mother    Hypertension Mother    Stroke Mother    Kidney disease Mother 93       dialysis   Alcohol abuse Father    Arthritis Father    Cancer Father 31       brain   Hypertension Father    Hypertension Sister    Arthritis Sister    Hypertension Brother    Arthritis Brother    Hypertension Brother    Arthritis Brother    Hypertension Brother    Hypertension Brother    Hypertension Brother    Hypertension Brother    Other Brother        MVA   Colon cancer Neg Hx    SOCIAL HISTORY Social History   Tobacco Use   Smoking status: Never   Smokeless tobacco: Never  Vaping Use   Vaping Use: Never used  Substance Use Topics   Alcohol use: No   Drug use: No       OPHTHALMIC EXAM:  Base Eye Exam     Visual Acuity (Snellen - Linear)       Right Left   Dist cc 20/30 20/20   Dist ph cc 20/20     Correction: Glasses         Tonometry (Tonopen, 8:44 AM)       Right Left   Pressure 15 15         Pupils       Dark Light Shape React APD   Right 3 2 Round Brisk None   Left 3 2 Round Brisk None         Visual Fields       Left Right    Full Full         Extraocular Movement       Right Left    Full, Ortho Full, Ortho         Neuro/Psych     Oriented x3: Yes   Mood/Affect: Normal         Dilation     Both eyes: 1.0% Mydriacyl, 2.5%  Phenylephrine @ 8:43 AM           Slit Lamp and Fundus Exam     Slit Lamp  Exam       Right Left   Lids/Lashes Dermatochalasis - upper lid Dermatochalasis - upper lid   Conjunctiva/Sclera small nasal and temporal pinguecula small temporal pinguecula, no injection   Cornea arcus, 1+ fine Punctate epithelial erosions, tear film debris arcus, 1+fine Punctate epithelial erosions   Anterior Chamber deep, clear, narrow angles deep, clear, narrow angles   Iris Round and reactive Round and dilated   Lens 2+ Nuclear sclerosis, 2+ Cortical cataract 2+ Nuclear sclerosis, 2-3+ Cortical cataract   Anterior Vitreous Mild Vitreous syneresis, PVD, Mariel Kansky Ring Mild Vitreous syneresis, PVD, Weiss ring, vitreous condensations - improved         Fundus Exam       Right Left   Disc Pink and Sharp Pink and Sharp, mild tilt   C/D Ratio 0.5 0.3   Macula Flat, good foveal reflex, RPE mottling, No heme or edema Flat, good foveal reflex, mild RPE mottling, No heme or edema   Vessels mild attenuation, mild tortuosity attenuated, copper wiring, Tortuous   Periphery focal retinal tear at 1100 with SRF -- focal RD -- good laser changes from 0900-1130, focal schisis cavity at 0730 -- very tiny, no outer retinal hole, mild reticular degeneration, pigmented cystoid degeneration, no new RT/RD or lattice Attached, bullous schisis cavity from 0100-0200 periphery, shallow schisis cavity IT periphery w/ peripheral outer retinal hole -- good laser changes surrounding, reticular degeneration, no new RT/RD or lattice           Refraction     Wearing Rx       Sphere Cylinder Axis Add   Right +1.25 +0.25 060 +2.25   Left +1.25 Sphere  +2.25           IMAGING AND PROCEDURES  Imaging and Procedures for 02/20/2022  OCT, Retina - OU - Both Eyes       Right Eye Quality was good. Central Foveal Thickness: 258. Progression has improved. Findings include normal foveal contour, no IRF, no SRF (Stable release of VMA  to full PVD; Trace vit opacities -- improved; Shallow SRF ST periphery caught on widefield - not imaged today).   Left Eye Quality was good. Central Foveal Thickness: 258. Progression has been stable. Findings include normal foveal contour, no IRF, no SRF, subretinal fluid, vitreomacular adhesion (stable release of VMA to full PVD, vitreous opacities - improved, bullous retinoschisis ST and IT periphery caught on widefield ).   Notes *Images captured and stored on drive  Diagnosis / Impression:  OD: Stable release of VMA to full PVD; Trace vit opacities -- improved; Shallow SRF ST periphery caught on widefield - not imaged today OS: stable release of VMA to full PVD, vitreous opacities - improved, bullous retinoschisis ST and IT periphery caught on widefield   Clinical management:  See below  Abbreviations: NFP - Normal foveal profile. CME - cystoid macular edema. PED - pigment epithelial detachment. IRF - intraretinal fluid. SRF - subretinal fluid. EZ - ellipsoid zone. ERM - epiretinal membrane. ORA - outer retinal atrophy. ORT - outer retinal tubulation. SRHM - subretinal hyper-reflective material. IRHM - intraretinal hyper-reflective material            ASSESSMENT/PLAN:    ICD-10-CM   1. Bilateral retinoschisis  H33.103 OCT, Retina - OU - Both Eyes    2. Retinal hole of both eyes  H33.323     3. Right retinal detachment  H33.21     4. Posterior vitreous detachment of both eyes  H43.813 OCT, Retina -  OU - Both Eyes    5. Essential hypertension  I10     6. Hypertensive retinopathy of both eyes  H35.033     7. Combined forms of age-related cataract of both eyes  H25.813      1. Retinoschisis OU  - OD w/ shallow schisis cavity IT periphery  - OS w/ bullous retinoschisis ST periphery (0100-0200) and shallow schisis IT periphery w/ peripheral outer retinal hole  - asymptomatic, BCVA 20/25 OU  - OS lesion found during routine exam by Dr. Jorja Loa  - s/p laser retinopexy OS  (01.13.23) to patches of schisis -- good laser changes in place surrounding schisis  - no treatment recommended for shallow schisis OD at this time -- monitor  - f/u in 6 mos, sooner prn -- DFE/OCT  2,3. Retinal hole w/ focal retinal detachment OD  - new retinal hole at 11 w/ mild heme and surrounding shallow SRF / RD from 09-1128 noted 4.7.23  - asymptomatic  - s/p laser retinopexy OD 04.07.23 -- good laser changes surrounding - no new RT/RD OD - f/u 6 months DFE, OCT  4. PVD / vitreous syneresis OU  - acute symptomatic FOL and floater OD--onset 01/06/22  - acute symptomatic flashes / floaters OS -- onset 06.04.23  - Discussed findings and prognosis  - No RT or RD on 360 scleral depressed exam OU  - Reviewed s/s of RT/RD  - Strict return precautions for any such RT/RD signs/symptoms  - monitor  5,6. Hypertensive retinopathy OU - discussed importance of tight BP control - monitor  7. Mixed Cataract OU - The symptoms of cataract, surgical options, and treatments and risks were discussed with patient. - discussed diagnosis and progression - not yet visually significant - monitor for now  Ophthalmic Meds Ordered this visit:  No orders of the defined types were placed in this encounter.    Return in about 6 months (around 08/21/2022) for f/u retinoschisis OU, DFE, OCT.  There are no Patient Instructions on file for this visit.   Explained the diagnoses, plan, and follow up with the patient and they expressed understanding.  Patient expressed understanding of the importance of proper follow up care.   This document serves as a record of services personally performed by Gardiner Sleeper, MD, PhD. It was created on their behalf by Renaldo Reel, [place] an ophthalmic technician. The creation of this record is the provider's dictation and/or activities during the visit.    Electronically signed by:  Renaldo Reel, COT  02.06.24 9:50 PM  This document serves as a record of  services personally performed by Gardiner Sleeper, MD, PhD. It was created on their behalf by San Jetty. Owens Shark, OA an ophthalmic technician. The creation of this record is the provider's dictation and/or activities during the visit.    Electronically signed by: San Jetty. Owens Shark, [place] 02.20.2024 9:50 PM   Gardiner Sleeper, M.D., Ph.D. Diseases & Surgery of the Retina and Vitreous [facility]  I have reviewed the above documentation for accuracy and completeness, and I agree with the above. Gardiner Sleeper, M.D., Ph.D. 02/21/22 9:54 PM  Abbreviations: M myopia (nearsighted); A astigmatism; H hyperopia (farsighted); P presbyopia; Mrx spectacle prescription;  CTL contact lenses; OD right eye; OS left eye; OU both eyes  XT exotropia; ET esotropia; PEK punctate epithelial keratitis; PEE punctate epithelial erosions; DES dry eye syndrome; MGD meibomian gland dysfunction; ATs artificial tears; PFAT's preservative free artificial tears; [place] nuclear sclerotic  cataract; PSC posterior subcapsular cataract; ERM epi-retinal membrane; PVD posterior vitreous detachment; RD retinal detachment; DM diabetes mellitus; DR diabetic retinopathy; NPDR non-proliferative diabetic retinopathy; PDR proliferative diabetic retinopathy; CSME clinically significant macular edema; DME diabetic macular edema; dbh dot blot hemorrhages; CWS cotton wool spot; POAG primary open angle glaucoma; C/D cup-to-disc ratio; HVF humphrey visual field; GVF goldmann visual field; OCT optical coherence tomography; IOP intraocular pressure; BRVO Branch retinal vein occlusion; CRVO central retinal vein occlusion; CRAO central retinal artery occlusion; BRAO branch retinal artery occlusion; RT retinal tear; SB scleral buckle; PPV pars plana vitrectomy; VH Vitreous hemorrhage; PRP panretinal laser photocoagulation; IVK intravitreal kenalog; VMT vitreomacular traction; MH Macular hole;  NVD neovascularization of the disc; NVE  neovascularization elsewhere; AREDS age related eye disease study; ARMD age related macular degeneration; POAG primary open angle glaucoma; EBMD epithelial/anterior basement membrane dystrophy; ACIOL anterior chamber intraocular lens; IOL intraocular lens; PCIOL posterior chamber intraocular lens; Phaco/IOL phacoemulsification with intraocular lens placement; [place] photorefractive keratectomy; LASIK laser assisted in situ keratomileusis; HTN hypertension; DM diabetes mellitus; COPD chronic obstructive pulmonary disease

## 2022-02-20 ENCOUNTER — Ambulatory Visit (INDEPENDENT_AMBULATORY_CARE_PROVIDER_SITE_OTHER): Payer: 59 | Admitting: Ophthalmology

## 2022-02-20 DIAGNOSIS — H43813 Vitreous degeneration, bilateral: Secondary | ICD-10-CM | POA: Diagnosis not present

## 2022-02-20 DIAGNOSIS — H25813 Combined forms of age-related cataract, bilateral: Secondary | ICD-10-CM

## 2022-02-20 DIAGNOSIS — H3321 Serous retinal detachment, right eye: Secondary | ICD-10-CM | POA: Diagnosis not present

## 2022-02-20 DIAGNOSIS — H33323 Round hole, bilateral: Secondary | ICD-10-CM | POA: Diagnosis not present

## 2022-02-20 DIAGNOSIS — H35033 Hypertensive retinopathy, bilateral: Secondary | ICD-10-CM

## 2022-02-20 DIAGNOSIS — I10 Essential (primary) hypertension: Secondary | ICD-10-CM | POA: Diagnosis not present

## 2022-02-20 DIAGNOSIS — H33103 Unspecified retinoschisis, bilateral: Secondary | ICD-10-CM | POA: Diagnosis not present

## 2022-02-21 ENCOUNTER — Encounter (INDEPENDENT_AMBULATORY_CARE_PROVIDER_SITE_OTHER): Payer: Self-pay | Admitting: Ophthalmology

## 2022-03-12 ENCOUNTER — Other Ambulatory Visit (HOSPITAL_COMMUNITY): Payer: Self-pay | Admitting: Internal Medicine

## 2022-03-12 DIAGNOSIS — Z1231 Encounter for screening mammogram for malignant neoplasm of breast: Secondary | ICD-10-CM

## 2022-03-21 ENCOUNTER — Ambulatory Visit (HOSPITAL_COMMUNITY)
Admission: RE | Admit: 2022-03-21 | Discharge: 2022-03-21 | Disposition: A | Discharge status: Home / Self Care | Payer: 59 | Source: Ambulatory Visit | Attending: Internal Medicine | Admitting: Internal Medicine

## 2022-03-21 ENCOUNTER — Encounter (HOSPITAL_COMMUNITY): Payer: Self-pay

## 2022-03-21 DIAGNOSIS — Z1231 Encounter for screening mammogram for malignant neoplasm of breast: Secondary | ICD-10-CM

## 2022-03-28 ENCOUNTER — Ambulatory Visit: Payer: 59 | Admitting: Gastroenterology

## 2022-03-30 IMAGING — MG MM DIGITAL SCREENING BILAT W/ TOMO AND CAD
8 series · 9 of 24 positions shown · non-contrast
Comparison: Previous exam(s).

CLINICAL DATA: Screening.

EXAM:
DIGITAL SCREENING BILATERAL MAMMOGRAM WITH TOMOSYNTHESIS AND CAD
TECHNIQUE: Bilateral screening digital craniocaudal and mediolateral oblique
mammograms were obtained. Bilateral screening digital breast
tomosynthesis was performed. The images were evaluated with
computer-aided detection.

[L MLO synth-2D]
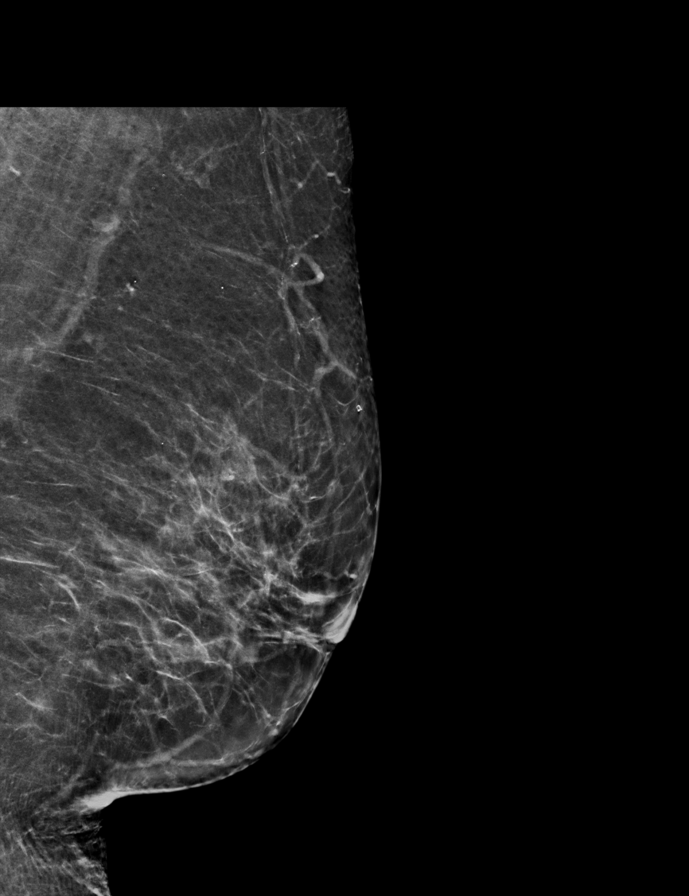

[R CC synth-2D]
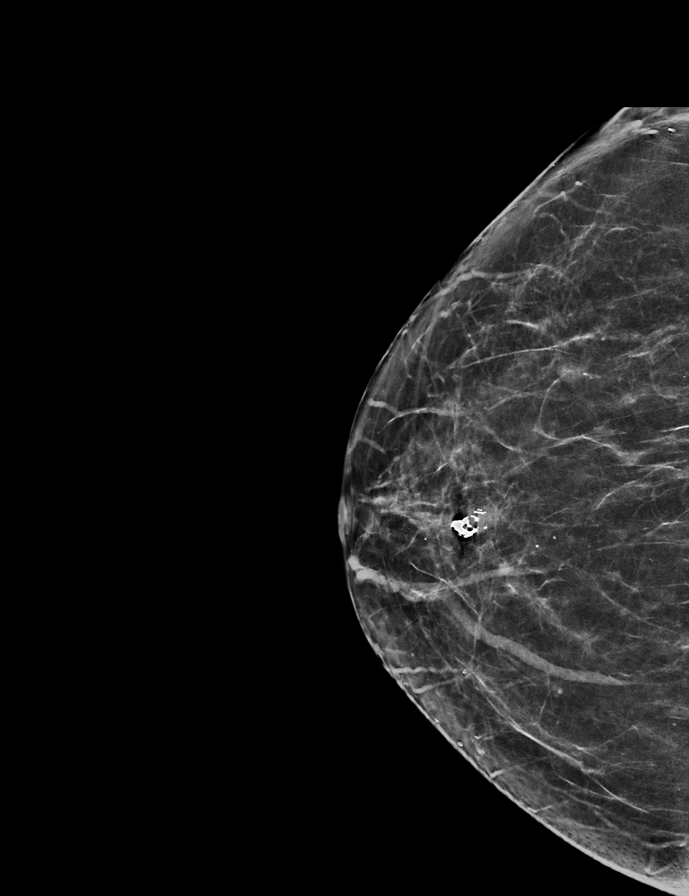

[L CC synth-2D]
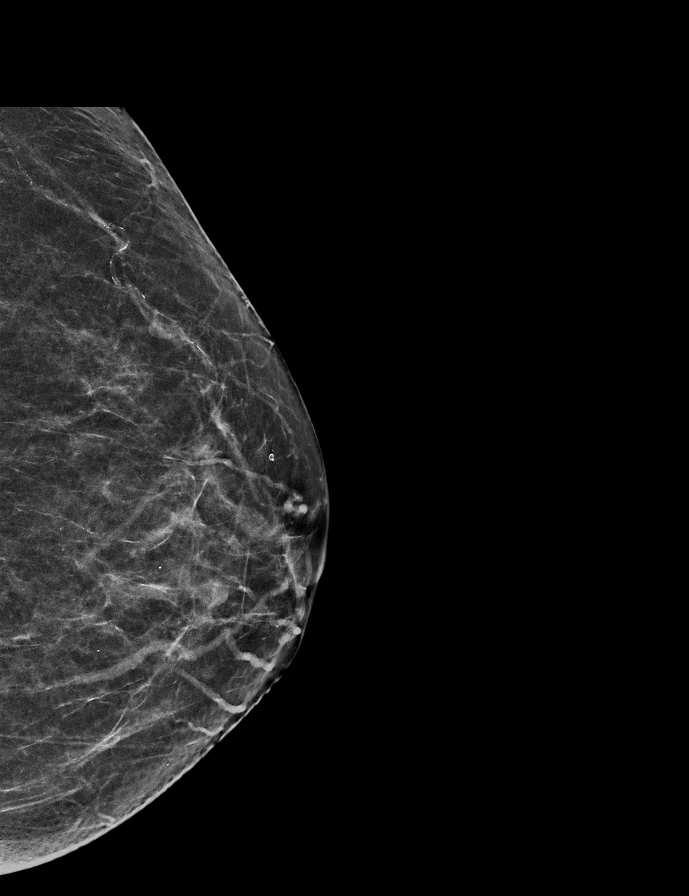

[R MLO synth-2D]
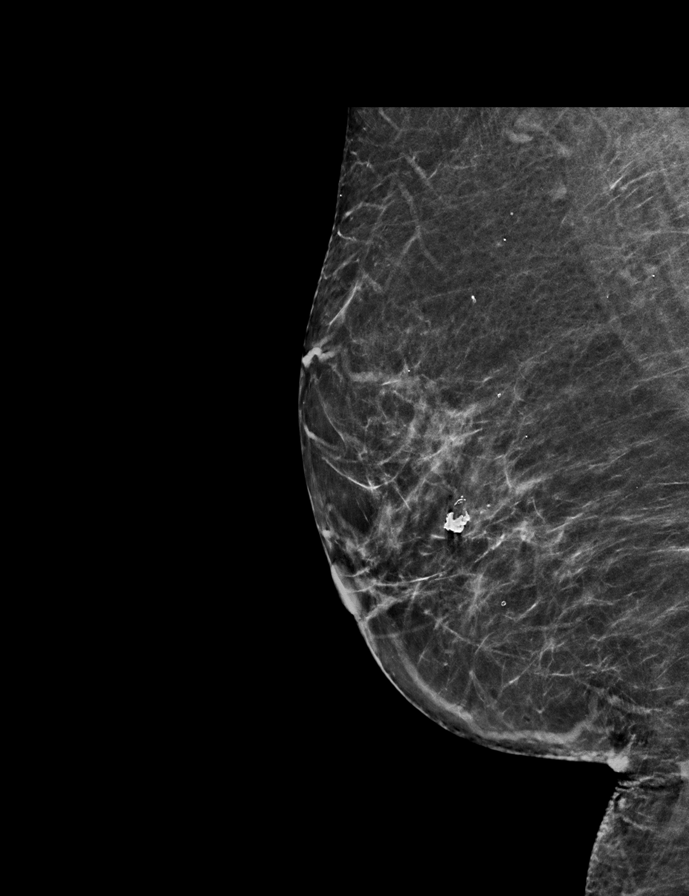

[L CC tomo · 2 of 58 frames shown]
[frame 19/58]
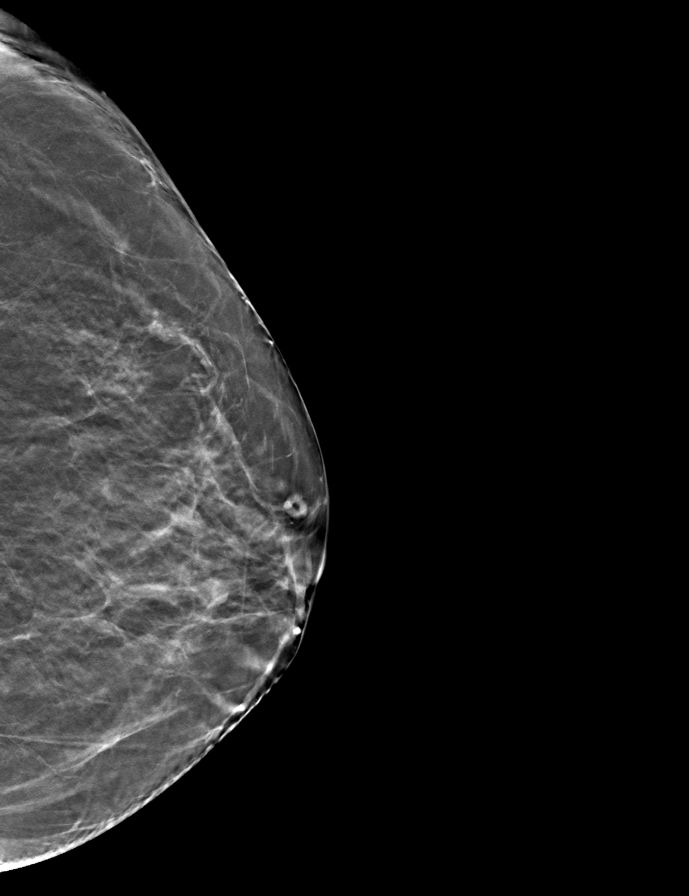
[frame 29/58]
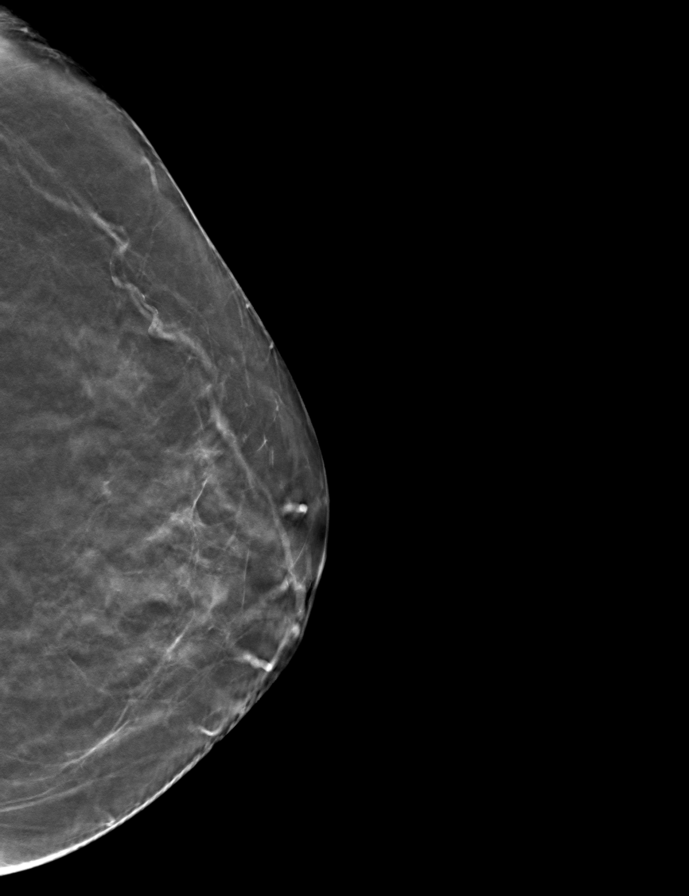

[R MLO tomo · tomo slice 31/62.0]
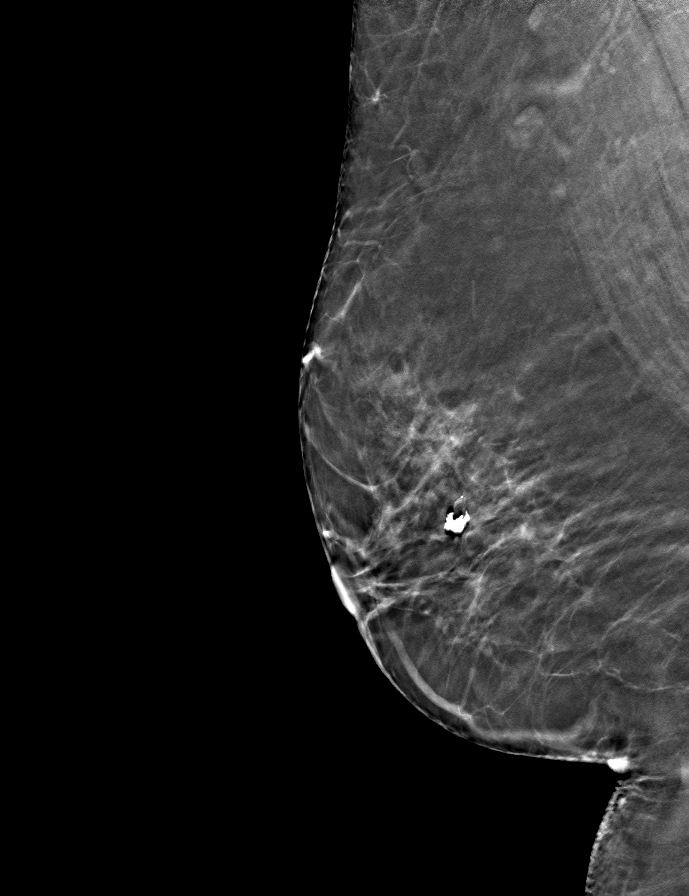

[L MLO tomo · tomo slice 34/67.0]
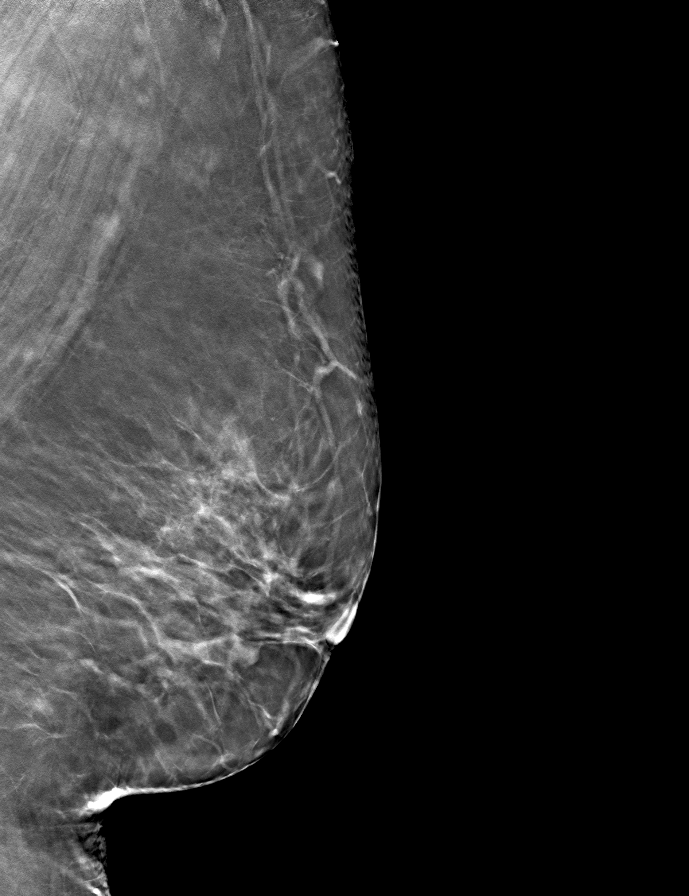

[R CC tomo · tomo slice 31/60.0]
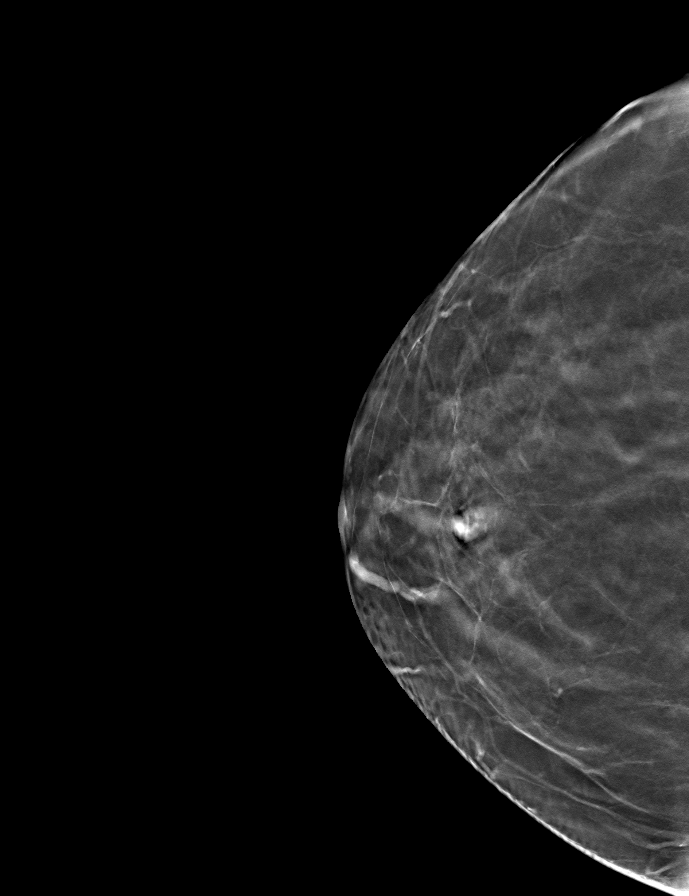

[9 of 24 positions shown; findings below may reference images not displayed]

ACR Breast Density Category b: There are scattered areas of
fibroglandular density.
FINDINGS: There are no findings suspicious for malignancy.
IMPRESSION: No mammographic evidence of malignancy. A result letter of this
screening mammogram will be mailed directly to the patient.

RECOMMENDATION:
Screening mammogram in one year. (Code:51-O-LD2)

BI-RADS CATEGORY  1: Negative.

## 2022-05-08 DIAGNOSIS — J302 Other seasonal allergic rhinitis: Secondary | ICD-10-CM | POA: Insufficient documentation

## 2022-05-08 DIAGNOSIS — J069 Acute upper respiratory infection, unspecified: Secondary | ICD-10-CM | POA: Insufficient documentation

## 2022-05-08 DIAGNOSIS — R059 Cough, unspecified: Secondary | ICD-10-CM | POA: Diagnosis not present

## 2022-07-16 ENCOUNTER — Ambulatory Visit: Payer: 59 | Admitting: Gastroenterology

## 2022-07-16 ENCOUNTER — Encounter: Payer: Self-pay | Admitting: Gastroenterology

## 2022-07-16 VITALS — BP 144/82 | HR 72 | Temp 98.3°F | Ht 65.0 in | Wt 206.4 lb

## 2022-07-16 DIAGNOSIS — K219 Gastro-esophageal reflux disease without esophagitis: Secondary | ICD-10-CM

## 2022-07-16 DIAGNOSIS — K5900 Constipation, unspecified: Secondary | ICD-10-CM

## 2022-07-16 DIAGNOSIS — D225 Melanocytic nevi of trunk: Secondary | ICD-10-CM | POA: Diagnosis not present

## 2022-07-16 DIAGNOSIS — L989 Disorder of the skin and subcutaneous tissue, unspecified: Secondary | ICD-10-CM | POA: Diagnosis not present

## 2022-07-16 DIAGNOSIS — R140 Abdominal distension (gaseous): Secondary | ICD-10-CM

## 2022-07-16 NOTE — Patient Instructions (Addendum)
Try switching to a lactose free milk such as Fairlife or Lactaid. You could also consider other alternative's to cow's milk, such as almond milk.  If you choose to eat something with milk, such as ice cream or cheese, you can try lactaid tablets as per package instructions.  Continue to limit foods on the list below to 1-2 times per week to help reduce gas/bloating.  If your symptoms do not improve, we can consider further work up. Please reach out with a progress report in 2-3 weeks.   Foods to limit Fruits Fresh, dried, and juiced forms of apple, pear, watermelon, peach, plum, cherries, apricots, blackberries, boysenberries, figs, nectarines, and mango. Avocado. Vegetables Chicory root, artichoke, asparagus, cabbage, snow peas, Brussels sprouts, broccoli, sugar snap peas, mushrooms, celery, and cauliflower. Onions, garlic, leeks, and the white part of scallions. Grains Wheat, including kamut, durum, and semolina. Barley and bulgur. Couscous. Wheat-based cereals. Wheat noodles, bread, crackers, and pastries. Meats and other proteins Fried or fatty meat. Sausage. Cashews and pistachios. Soybeans, baked beans, black beans, chickpeas, kidney beans, fava beans, navy beans, lentils, black-eyed peas, and split peas. Dairy Milk, yogurt, ice cream, and soft cheese. Cream and sour cream. Milk-based sauces. Custard. Buttermilk. Soy milk. Seasoning and other foods Any sugar-free gum or candy. Foods that contain artificial sweeteners such as sorbitol, mannitol, isomalt, or xylitol. Foods that contain honey, high-fructose corn syrup, or agave. Bouillon, vegetable stock, beef stock, and chicken stock. Garlic and onion powder. Condiments made with onion, such as hummus, chutney, pickles, relish, salad dressing, and salsa. Tomato paste. Beverages Chicory-based drinks. Coffee substitutes. Chamomile tea. Fennel tea. Sweet or fortified wines such as port or sherry. Diet soft drinks made with isomalt, mannitol,  maltitol, sorbitol, or xylitol. Apple, pear, and mango juice. Juices with high-fructose corn syrup. The items listed above may not be a complete list of foods and beverages you should avoid. Contact a dietitian for more information.

## 2022-07-16 NOTE — Progress Notes (Signed)
GI Office Note    Referring Provider: Benita Stabile, MD Primary Care Physician:  Benita Stabile, MD  Primary Gastroenterologist: Roetta Sessions, MD   Chief Complaint   Chief Complaint  Patient presents with   Bloated    Stays bloated and gassy all the time    History of Present Illness   Alexandra Hoffman is a 72 y.o. female presenting today for further evaluate bloating, gas.  Last seen February 2022.  History of GERD/dyspepsia/constipation.  EGD March 2021 showed gastric polyps (fundic gland), small hiatal hernia.  Last colonoscopy 2022 as outlined below.   Complains of frequent belching and bloating. Feels bloated in the upper abdomen and down the left side of the abdomen. If she pushes on her these areas of her abdomen, then she has uncontrolled belching. She continues to have bloated related to certain vegetables, just deals with it. She notes however now with dairy, she is getting increased bloating and now nausea too. Notes it mostly with milk and ice cream. She does eat pepper jack cheese on her salads and sandwiches. No vomiting. If she feels this way, she often will take second pantoprazole. No heartburn. BM usually every 2 days if she does not take Linzess. She takes Linzess few times per week. No melena, brbpr. She rarely has to take mobic now that she had her knee replacement in 01/2022. She notes she retired completely. She has since gained about 10 pounds. In the past, Miralax caused bloating.  Did not tolerate fiber supplement.  Could not afford Amitiza. No urinary symptoms.   Colonoscopy April 2022: -two 7 to 9 mm polyps in the cecum removed -Nonbleeding internal hemorrhoids -Chronic diverticulosis -Status post segmental resection -Tubular adenomas, next exam in 5 years  Medications   Current Outpatient Medications  Medication Sig Dispense Refill   ALPRAZolam (XANAX) 0.5 MG tablet Take 0.5 mg by mouth at bedtime.     carboxymethylcellulose (REFRESH PLUS) 0.5 %  SOLN Place 1 drop into both eyes daily.     DULoxetine (CYMBALTA) 30 MG capsule Take 1 capsule (30 mg total) by mouth daily. 30 capsule 6   gabapentin (NEURONTIN) 100 MG capsule Take 100 mg by mouth 3 (three) times daily.     levocetirizine (XYZAL) 5 MG tablet Take 5 mg by mouth at bedtime.     linaclotide (LINZESS) 72 MCG capsule TAKE ONE CAPSULE BY MOUTH ONCE DAILY WITH BREAKFAST. (Patient taking differently: Take 72 mcg by mouth 2 (two) times a week.) 30 capsule 5   lisinopril (PRINIVIL,ZESTRIL) 10 MG tablet Take 10 mg by mouth in the morning.     meloxicam (MOBIC) 15 MG tablet Take 15 mg by mouth daily.     Omega-3 Fatty Acids (FISH OIL) 1200 MG CAPS Take 1,200 mg by mouth in the morning.     pantoprazole (PROTONIX) 40 MG tablet TAKE ONE TABLET BY MOUTH ONCE DAILY BEFORE BREAKFAST. 30 tablet 3   valACYclovir (VALTREX) 1000 MG tablet SMARTSIG:1 Tablet(s) By Mouth Every 12 Hours     vitamin B-12 (CYANOCOBALAMIN) 1000 MCG tablet Take 1,000 mcg by mouth in the morning.     No current facility-administered medications for this visit.    Allergies   Allergies as of 07/16/2022   (No Known Allergies)        Review of Systems   General: Negative for anorexia, weight loss, fever, chills, fatigue, weakness. ENT: Negative for hoarseness, difficulty swallowing , nasal congestion. CV: Negative for chest  pain, angina, palpitations, dyspnea on exertion, peripheral edema.  Respiratory: Negative for dyspnea at rest, dyspnea on exertion, cough, sputum, wheezing.  GI: See history of present illness. GU:  Negative for dysuria, hematuria, urinary incontinence, urinary frequency, nocturnal urination.  Endo: Negative for unusual weight change.     Physical Exam   BP (!) 144/82 (BP Location: Right Arm, Patient Position: Sitting, Cuff Size: Large)   Pulse 72   Temp 98.3 F (36.8 C) (Oral)   Ht 5\' 5"  (1.651 m)   Wt 206 lb 6.4 oz (93.6 kg)   LMP 01/02/2004 (Approximate)   SpO2 95%   BMI 34.35 kg/m     General: Well-nourished, well-developed in no acute distress.  Eyes: No icterus. Mouth: Oropharyngeal mucosa moist and pink  Abdomen: Bowel sounds are normal, nontender, nondistended, no hepatosplenomegaly or masses,  no abdominal bruits or hernia , no rebound or guarding.  Rectal: not performed Extremities: No lower extremity edema. No clubbing or deformities. Neuro: Alert and oriented x 4   Skin: Warm and dry, no jaundice.   Psych: Alert and cooperative, normal mood and affect.  Labs   Lab Results  Component Value Date   NA 140 01/09/2022   CL 107 01/09/2022   K 3.8 01/09/2022   CO2 25 01/09/2022   BUN 17 01/09/2022   CREATININE 0.98 01/09/2022   GFRNONAA >60 01/09/2022   CALCIUM 9.0 01/09/2022   GLUCOSE 90 01/09/2022   No results found for: "ALT", "AST", "GGT", "ALKPHOS", "BILITOT" Lab Results  Component Value Date   WBC 5.1 01/09/2022   HGB 13.8 01/09/2022   HCT 42.1 01/09/2022   MCV 89.0 01/09/2022   PLT 198 01/09/2022    Imaging Studies   No results found.  Assessment   *GERD *bloating *constipation  Complains of chronic bloating/belching. Bloating typically upper abdomen, left abdomen. Increased belching noted if pressure applied to abdomen. No heartburn. Continues PPI. Symptoms now noted with dairy and associated with nausea but no vomiting. Really had not noted significant issues with dairy in the past. We discuss changing to lactose free milk or other alternative to cow's milk. Using lactaid with cheese, ice cream etc. We discussed considering CT scan for bloating to rule out more alarming etiologies. I suspect her symptoms are more digestive with possible lactose intolerance. She would like to old off on imaging at this time unless symptoms don't respond. We could also consider screening for celiac. Without other symptoms suggestive of celiac, we will hold off for now.  The patient was found to have elevated blood pressure when vital signs were checked  in the office. The blood pressure was rechecked by the nursing staff and it was found be persistently elevated >140/90 mmHg. I personally advised to the patient to follow up closely with his PCP for hypertension control.   PLAN   Switch to lactose-free milk or other alternative to cows milk. Try Lactaid tablets with ice cream or cheese. Limit foods from the high FODMAP categories. She will call with progress report in 2 to 3 weeks, if persisting symptoms or no improvement, consider imaging at that time.  Consider screening for celiac at that time.   Leanna Battles. Melvyn Neth, MHS, PA-C [facility]

## 2022-07-17 DIAGNOSIS — M25552 Pain in left hip: Secondary | ICD-10-CM | POA: Diagnosis not present

## 2022-08-03 DIAGNOSIS — I10 Essential (primary) hypertension: Secondary | ICD-10-CM | POA: Diagnosis not present

## 2022-08-09 DIAGNOSIS — I10 Essential (primary) hypertension: Secondary | ICD-10-CM | POA: Diagnosis not present

## 2022-08-09 DIAGNOSIS — R42 Dizziness and giddiness: Secondary | ICD-10-CM | POA: Diagnosis not present

## 2022-08-09 DIAGNOSIS — M542 Cervicalgia: Secondary | ICD-10-CM | POA: Diagnosis not present

## 2022-08-09 DIAGNOSIS — G4700 Insomnia, unspecified: Secondary | ICD-10-CM | POA: Diagnosis not present

## 2022-08-09 DIAGNOSIS — K589 Irritable bowel syndrome without diarrhea: Secondary | ICD-10-CM | POA: Diagnosis not present

## 2022-08-09 DIAGNOSIS — N289 Disorder of kidney and ureter, unspecified: Secondary | ICD-10-CM | POA: Diagnosis not present

## 2022-08-09 DIAGNOSIS — D225 Melanocytic nevi of trunk: Secondary | ICD-10-CM | POA: Diagnosis not present

## 2022-08-09 DIAGNOSIS — K219 Gastro-esophageal reflux disease without esophagitis: Secondary | ICD-10-CM | POA: Diagnosis not present

## 2022-08-09 DIAGNOSIS — J302 Other seasonal allergic rhinitis: Secondary | ICD-10-CM | POA: Diagnosis not present

## 2022-08-09 DIAGNOSIS — M79621 Pain in right upper arm: Secondary | ICD-10-CM | POA: Diagnosis not present

## 2022-08-09 DIAGNOSIS — M170 Bilateral primary osteoarthritis of knee: Secondary | ICD-10-CM | POA: Diagnosis not present

## 2022-08-21 ENCOUNTER — Encounter (INDEPENDENT_AMBULATORY_CARE_PROVIDER_SITE_OTHER): Payer: 59 | Admitting: Ophthalmology

## 2022-08-21 DIAGNOSIS — H43813 Vitreous degeneration, bilateral: Secondary | ICD-10-CM

## 2022-08-21 DIAGNOSIS — I10 Essential (primary) hypertension: Secondary | ICD-10-CM

## 2022-08-21 DIAGNOSIS — H33103 Unspecified retinoschisis, bilateral: Secondary | ICD-10-CM

## 2022-08-21 DIAGNOSIS — H35033 Hypertensive retinopathy, bilateral: Secondary | ICD-10-CM

## 2022-08-21 DIAGNOSIS — H3321 Serous retinal detachment, right eye: Secondary | ICD-10-CM

## 2022-08-21 DIAGNOSIS — H33323 Round hole, bilateral: Secondary | ICD-10-CM

## 2022-08-21 DIAGNOSIS — H25813 Combined forms of age-related cataract, bilateral: Secondary | ICD-10-CM

## 2022-08-23 DIAGNOSIS — J0110 Acute frontal sinusitis, unspecified: Secondary | ICD-10-CM | POA: Insufficient documentation

## 2022-08-23 DIAGNOSIS — J029 Acute pharyngitis, unspecified: Secondary | ICD-10-CM | POA: Insufficient documentation

## 2022-09-07 ENCOUNTER — Ambulatory Visit (INDEPENDENT_AMBULATORY_CARE_PROVIDER_SITE_OTHER): Payer: 59 | Admitting: Ophthalmology

## 2022-09-07 ENCOUNTER — Encounter (INDEPENDENT_AMBULATORY_CARE_PROVIDER_SITE_OTHER): Payer: Self-pay | Admitting: Ophthalmology

## 2022-09-07 DIAGNOSIS — H33103 Unspecified retinoschisis, bilateral: Secondary | ICD-10-CM

## 2022-09-07 DIAGNOSIS — H33323 Round hole, bilateral: Secondary | ICD-10-CM | POA: Diagnosis not present

## 2022-09-07 DIAGNOSIS — H35033 Hypertensive retinopathy, bilateral: Secondary | ICD-10-CM

## 2022-09-07 DIAGNOSIS — H3321 Serous retinal detachment, right eye: Secondary | ICD-10-CM

## 2022-09-07 DIAGNOSIS — I10 Essential (primary) hypertension: Secondary | ICD-10-CM | POA: Diagnosis not present

## 2022-09-07 DIAGNOSIS — H43813 Vitreous degeneration, bilateral: Secondary | ICD-10-CM | POA: Diagnosis not present

## 2022-09-07 DIAGNOSIS — H25813 Combined forms of age-related cataract, bilateral: Secondary | ICD-10-CM

## 2022-09-07 NOTE — Progress Notes (Signed)
[facility] - Clinic Note  09/07/2022     CHIEF COMPLAINT Patient presents for Retina Follow Up   HISTORY OF PRESENT ILLNESS: Alexandra Hoffman is a 72 y.o. female who presents to the clinic today for:   HPI     Retina Follow Up   Patient presents with  Other.  In both eyes.  This started years ago.  Duration of 6 weeks.  I, the attending physician,  performed the HPI with the patient and updated documentation appropriately.        Comments   Patient feels the vision is the same. She is using At's OU PRN.      Last edited by Rennis Chris, MD on 09/11/2022  1:59 AM.     Pt states she is still seeing floaters like normal, but no fol   Referring physician: Daisy Lazar, DO [address]  [place], [place] [address]  HISTORICAL INFORMATION:   Selected notes from the MEDICAL RECORD NUMBER Referred by Dr. Charise Killian LEE: 01/09/2021 Ocular Hx- Cataracts, DES OU, RD OS    CURRENT MEDICATIONS: Current Outpatient Medications (Ophthalmic Drugs)  Medication Sig   carboxymethylcellulose (REFRESH PLUS) 0.5 % SOLN Place 1 drop into both eyes daily.   No current facility-administered medications for this visit. (Ophthalmic Drugs)   Current Outpatient Medications (Other)  Medication Sig   ALPRAZolam (XANAX) 0.5 MG tablet Take 0.5 mg by mouth at bedtime.   DULoxetine (CYMBALTA) 30 MG capsule Take 1 capsule (30 mg total) by mouth daily.   gabapentin (NEURONTIN) 100 MG capsule Take 100 mg by mouth 3 (three) times daily.   levocetirizine (XYZAL) 5 MG tablet Take 5 mg by mouth at bedtime.   linaclotide (LINZESS) 72 MCG capsule TAKE ONE CAPSULE BY MOUTH ONCE DAILY WITH BREAKFAST. (Patient taking differently: Take 72 mcg by mouth 2 (two) times a week.)   lisinopril (PRINIVIL,ZESTRIL) 10 MG tablet Take 10 mg by mouth in the morning.   Omega-3 Fatty Acids (FISH OIL) 1200 MG CAPS Take 1,200 mg by mouth in the morning.   pantoprazole (PROTONIX) 40 MG tablet TAKE ONE TABLET BY  MOUTH ONCE DAILY BEFORE BREAKFAST.   valACYclovir (VALTREX) 1000 MG tablet SMARTSIG:1 Tablet(s) By Mouth Every 12 Hours   vitamin B-12 (CYANOCOBALAMIN) 1000 MCG tablet Take 1,000 mcg by mouth in the morning.   meloxicam (MOBIC) 15 MG tablet Take 15 mg by mouth daily.   No current facility-administered medications for this visit. (Other)   REVIEW OF SYSTEMS:    ALLERGIES No Known Allergies  PAST MEDICAL HISTORY Past Medical History:  Diagnosis Date   Anemia    In childhood   Anxiety    Arthritis    knees   Constipation    Depression    GERD (gastroesophageal reflux disease)    Hyperlipidemia 06/12/2020   Hypertension    Past Surgical History:  Procedure Laterality Date   COLON SURGERY  2006   perforated sigmoid diverticulitis, Dr Lovell Sheehan   COLONOSCOPY  2006   Dr. Jena Gauss: normal rectum and a few sigmoid diverticula, 7 mm angry pedunculated polyp at 30 cm, 4 mm sessile polyp base of cecum. Unknown path    COLONOSCOPY WITH PROPOFOL N/A 03/04/2017   Procedure: COLONOSCOPY WITH PROPOFOL;  Surgeon: Corbin Ade, MD;  Location: [facility];  Service: Endoscopy;  Laterality: N/A;  7:30am   COLONOSCOPY WITH PROPOFOL N/A 04/07/2020   Procedure: COLONOSCOPY WITH PROPOFOL;  Surgeon: Corbin Ade, MD;  Location: [facility];  Service: Endoscopy;  Laterality: N/A;  AM (wants early as possible due to nausea)   ESOPHAGOGASTRODUODENOSCOPY (EGD) WITH PROPOFOL N/A 03/19/2019   Rourk: Gastric polyps (biopsy showed fundic gland), small hiatal hernia.   POLYPECTOMY  03/04/2017   Procedure: POLYPECTOMY;  Surgeon: Corbin Ade, MD;  Location: [facility];  Service: Endoscopy;;  polyp at cecum and left colon   POLYPECTOMY  03/19/2019   Procedure: POLYPECTOMY;  Surgeon: Corbin Ade, MD;  Location: [facility];  Service: Endoscopy;;  gastric   POLYPECTOMY  04/07/2020   Procedure: POLYPECTOMY;  Surgeon: Corbin Ade, MD;  Location: [facility];  Service: Endoscopy;;   TOTAL KNEE  ARTHROPLASTY Left 01/22/2022   Procedure: LEFT TOTAL KNEE ARTHROPLASTY;  Surgeon: Joen Laura, MD;  Location: [facility];  Service: Orthopedics;  Laterality: Left;   FAMILY HISTORY Family History  Problem Relation Age of Onset   Arthritis Mother    Depression Mother    Diabetes Mother    Heart disease Mother    Hypertension Mother    Stroke Mother    Kidney disease Mother 67       dialysis   Alcohol abuse Father    Arthritis Father    Cancer Father 26       brain   Hypertension Father    Hypertension Sister    Arthritis Sister    Hypertension Brother    Arthritis Brother    Hypertension Brother    Arthritis Brother    Hypertension Brother    Hypertension Brother    Hypertension Brother    Hypertension Brother    Other Brother        MVA   Colon cancer Neg Hx    SOCIAL HISTORY Social History   Tobacco Use   Smoking status: Never   Smokeless tobacco: Never  Vaping Use   Vaping status: Never Used  Substance Use Topics   Alcohol use: No   Drug use: No       OPHTHALMIC EXAM:  Base Eye Exam     Visual Acuity (Snellen - Linear)       Right Left   Dist cc 20/25 +2 20/20   Dist ph cc 20/20     Correction: Glasses         Tonometry (Tonopen, 8:57 AM)       Right Left   Pressure 17 17         Pupils       Dark Light Shape React APD   Right 3 2 Round Brisk None   Left 3 2 Round Brisk None         Visual Fields       Left Right    Full Full         Extraocular Movement       Right Left    Full, Ortho Full, Ortho         Neuro/Psych     Oriented x3: Yes   Mood/Affect: Normal         Dilation     Both eyes: 1.0% Mydriacyl, 2.5% Phenylephrine @ 8:55 AM           Slit Lamp and Fundus Exam     Slit Lamp Exam       Right Left   Lids/Lashes Dermatochalasis - upper lid, Meibomian gland dysfunction Dermatochalasis - upper lid   Conjunctiva/Sclera nasal and temporal pinguecula small temporal pinguecula   Cornea arcus,  trace  Punctate epithelial erosions, tear film debris arcus, trace Punctate epithelial erosions, mild tear film debris   Anterior Chamber deep, clear, narrow angles deep, clear, narrow angles   Iris Round and reactive Round and dilated   Lens 2+ Nuclear sclerosis, 2+ Cortical cataract 2+ Nuclear sclerosis, 2-3+ Cortical cataract   Anterior Vitreous Mild syneresis, PVD, Janann August Ring, no pigment Mild Vitreous syneresis, PVD, Weiss ring, vitreous condensations - improved         Fundus Exam       Right Left   Disc Pink and Sharp Pink and Sharp, mild tilt   C/D Ratio 0.5 0.3   Macula Flat, good foveal reflex, RPE mottling, No heme or edema Flat, good foveal reflex, mild RPE mottling, No heme or edema   Vessels attenuated, Tortuous, mild copper wiring attenuated, Tortuous, mild copper wiring   Periphery focal retinal tear at 1100 with SRF -- focal RD -- good laser changes from 0900-1130, focal schisis cavity at 0730 -- very tiny, no outer retinal hole, mild reticular degeneration, pigmented cystoid degeneration, no new RT/RD or lattice Attached, bullous schisis cavity from 0100-0200 periphery, shallow schisis cavity IT periphery w/ peripheral outer retinal hole -- good laser changes surrounding, reticular degeneration, no new RT/RD or lattice           Refraction     Wearing Rx       Sphere Cylinder Axis Add   Right +1.25 +0.25 060 +2.25   Left +1.25 Sphere  +2.25           IMAGING AND PROCEDURES  Imaging and Procedures for 09/07/2022  OCT, Retina - OU - Both Eyes       Right Eye Quality was good. Central Foveal Thickness: 259. Progression has been stable. Findings include normal foveal contour, no IRF, no SRF (Stable improvement in vit opacities; Shallow SRF ST periphery caught on widefield - not imaged today).   Left Eye Quality was good. Central Foveal Thickness: 264. Progression has been stable. Findings include normal foveal contour, no IRF, no SRF, subretinal fluid,  vitreomacular adhesion (vitreous opacities - improved, bullous retinoschisis ST and IT periphery caught on widefield ).   Notes *Images captured and stored on drive  Diagnosis / Impression:  OD: Stable improvement in vit opacities; Shallow SRF ST periphery caught on widefield - not imaged today OS: vitreous opacities - improved, bullous retinoschisis ST and IT periphery caught on widefield   Clinical management:  See below  Abbreviations: NFP - Normal foveal profile. CME - cystoid macular edema. PED - pigment epithelial detachment. IRF - intraretinal fluid. SRF - subretinal fluid. EZ - ellipsoid zone. ERM - epiretinal membrane. ORA - outer retinal atrophy. ORT - outer retinal tubulation. SRHM - subretinal hyper-reflective material. IRHM - intraretinal hyper-reflective material            ASSESSMENT/PLAN:    ICD-10-CM   1. Bilateral retinoschisis  H33.103 OCT, Retina - OU - Both Eyes    2. Retinal hole of both eyes  H33.323     3. Right retinal detachment  H33.21     4. Posterior vitreous detachment of both eyes  H43.813     5. Essential hypertension  I10     6. Hypertensive retinopathy of both eyes  H35.033     7. Combined forms of age-related cataract of both eyes  H25.813       1. Retinoschisis OU  - OD w/ shallow schisis cavity IT periphery - OS w/ bullous retinoschisis ST periphery (  0100-0200) and shallow schisis IT periphery w/ peripheral outer retinal hole  - asymptomatic, BCVA 20/20 OU  - OS lesion found during routine exam by Dr. Charise Killian - s/p laser retinopexy OS (01.13.23) to patches of schisis -- good laser changes in place surrounding schisis  - no treatment recommended for shallow schisis OD at this time -- monitor  - f/u in 6-9 mos, sooner prn -- DFE/OCT  2,3. Retinal hole w/ focal retinal detachment OD - new retinal hole at 11 w/ mild heme and surrounding shallow SRF / RD from 09-1128 noted 4.7.23  - asymptomatic  - s/p laser retinopexy OD 04.07.23 --  good laser changes surrounding - no new RT/RD OD - monitor  4. PVD / vitreous syneresis OU  - acute symptomatic FOL and floater OD--onset 01/06/22  - acute symptomatic flashes / floaters OS -- onset 06.04.23  - Discussed findings and prognosis  - No RT or RD on 360 scleral depressed exam OU  - Reviewed s/s of RT/RD  - Strict return precautions for any such RT/RD signs/symptoms  - monitor  5,6. Hypertensive retinopathy OU - discussed importance of tight BP control - monitor  7. Mixed Cataract OU - The symptoms of cataract, surgical options, and treatments and risks were discussed with patient. - discussed diagnosis and progression - not yet visually significant - monitor for now  Ophthalmic Meds Ordered this visit:  No orders of the defined types were placed in this encounter.    Return for f/u 6-9 months, retinoschisis OU, DFE, OCT.  There are no Patient Instructions on file for this visit.   Explained the diagnoses, plan, and follow up with the patient and they expressed understanding.  Patient expressed understanding of the importance of proper follow up care.   This document serves as a record of services personally performed by Karie Chimera, MD, PhD. It was created on their behalf by Glee Arvin. Manson Passey, OA an ophthalmic technician. The creation of this record is the provider's dictation and/or activities during the visit.    Electronically signed by: Glee Arvin. Manson Passey, OA 09/11/22 2:04 AM  Karie Chimera, M.D., Ph.D. Diseases & Surgery of the Retina and Vitreous [facility]  I have reviewed the above documentation for accuracy and completeness, and I agree with the above. Karie Chimera, M.D., Ph.D. 09/11/22 2:07 AM   Abbreviations: M myopia (nearsighted); A astigmatism; H hyperopia (farsighted); P presbyopia; Mrx spectacle prescription;  CTL contact lenses; OD right eye; OS left eye; OU both eyes  XT exotropia; ET esotropia; PEK punctate  epithelial keratitis; PEE punctate epithelial erosions; DES dry eye syndrome; MGD meibomian gland dysfunction; ATs artificial tears; PFAT's preservative free artificial tears; NSC nuclear sclerotic cataract; PSC posterior subcapsular cataract; ERM epi-retinal membrane; PVD posterior vitreous detachment; RD retinal detachment; DM diabetes mellitus; DR diabetic retinopathy; NPDR non-proliferative diabetic retinopathy; PDR proliferative diabetic retinopathy; CSME clinically significant macular edema; DME diabetic macular edema; dbh dot blot hemorrhages; CWS cotton wool spot; POAG primary open angle glaucoma; C/D cup-to-disc ratio; HVF humphrey visual field; GVF goldmann visual field; OCT optical coherence tomography; IOP intraocular pressure; BRVO Branch retinal vein occlusion; CRVO central retinal vein occlusion; CRAO central retinal artery occlusion; BRAO branch retinal artery occlusion; RT retinal tear; SB scleral buckle; PPV pars plana vitrectomy; VH Vitreous hemorrhage; PRP panretinal laser photocoagulation; IVK intravitreal kenalog; VMT vitreomacular traction; MH Macular hole;  NVD neovascularization of the disc; NVE neovascularization elsewhere; AREDS age related eye disease study; ARMD age  related macular degeneration; POAG primary open angle glaucoma; EBMD epithelial/anterior basement membrane dystrophy; ACIOL anterior chamber intraocular lens; IOL intraocular lens; PCIOL posterior chamber intraocular lens; Phaco/IOL phacoemulsification with intraocular lens placement; PRK photorefractive keratectomy; LASIK laser assisted in situ keratomileusis; HTN hypertension; DM diabetes mellitus; COPD chronic obstructive pulmonary disease

## 2022-09-11 ENCOUNTER — Encounter (INDEPENDENT_AMBULATORY_CARE_PROVIDER_SITE_OTHER): Payer: Self-pay | Admitting: Ophthalmology

## 2022-09-12 DIAGNOSIS — J029 Acute pharyngitis, unspecified: Secondary | ICD-10-CM | POA: Insufficient documentation

## 2022-10-03 ENCOUNTER — Other Ambulatory Visit: Payer: Self-pay | Admitting: Gastroenterology

## 2022-10-23 DIAGNOSIS — M545 Low back pain, unspecified: Secondary | ICD-10-CM | POA: Diagnosis not present

## 2022-10-26 DIAGNOSIS — M545 Low back pain, unspecified: Secondary | ICD-10-CM | POA: Diagnosis not present

## 2022-10-29 DIAGNOSIS — Z23 Encounter for immunization: Secondary | ICD-10-CM | POA: Diagnosis not present

## 2022-11-03 DIAGNOSIS — M545 Low back pain, unspecified: Secondary | ICD-10-CM | POA: Diagnosis not present

## 2022-11-14 DIAGNOSIS — M47816 Spondylosis without myelopathy or radiculopathy, lumbar region: Secondary | ICD-10-CM | POA: Diagnosis not present

## 2022-11-14 DIAGNOSIS — N289 Disorder of kidney and ureter, unspecified: Secondary | ICD-10-CM | POA: Diagnosis not present

## 2022-11-14 DIAGNOSIS — M461 Sacroiliitis, not elsewhere classified: Secondary | ICD-10-CM | POA: Diagnosis not present

## 2022-11-26 DIAGNOSIS — M461 Sacroiliitis, not elsewhere classified: Secondary | ICD-10-CM | POA: Diagnosis not present

## 2022-11-26 DIAGNOSIS — M47816 Spondylosis without myelopathy or radiculopathy, lumbar region: Secondary | ICD-10-CM | POA: Diagnosis not present

## 2022-12-03 DIAGNOSIS — M461 Sacroiliitis, not elsewhere classified: Secondary | ICD-10-CM | POA: Diagnosis not present

## 2022-12-03 DIAGNOSIS — M47816 Spondylosis without myelopathy or radiculopathy, lumbar region: Secondary | ICD-10-CM | POA: Diagnosis not present

## 2022-12-14 DIAGNOSIS — J302 Other seasonal allergic rhinitis: Secondary | ICD-10-CM | POA: Diagnosis not present

## 2022-12-14 DIAGNOSIS — J019 Acute sinusitis, unspecified: Secondary | ICD-10-CM | POA: Diagnosis not present

## 2022-12-17 DIAGNOSIS — M47816 Spondylosis without myelopathy or radiculopathy, lumbar region: Secondary | ICD-10-CM | POA: Diagnosis not present

## 2022-12-17 DIAGNOSIS — M461 Sacroiliitis, not elsewhere classified: Secondary | ICD-10-CM | POA: Diagnosis not present

## 2022-12-31 DIAGNOSIS — M461 Sacroiliitis, not elsewhere classified: Secondary | ICD-10-CM | POA: Diagnosis not present

## 2022-12-31 DIAGNOSIS — M47816 Spondylosis without myelopathy or radiculopathy, lumbar region: Secondary | ICD-10-CM | POA: Diagnosis not present

## 2023-01-03 DIAGNOSIS — N289 Disorder of kidney and ureter, unspecified: Secondary | ICD-10-CM | POA: Diagnosis not present

## 2023-01-03 DIAGNOSIS — I10 Essential (primary) hypertension: Secondary | ICD-10-CM | POA: Diagnosis not present

## 2023-01-03 DIAGNOSIS — M25561 Pain in right knee: Secondary | ICD-10-CM | POA: Diagnosis not present

## 2023-01-08 DIAGNOSIS — M1990 Unspecified osteoarthritis, unspecified site: Secondary | ICD-10-CM | POA: Diagnosis not present

## 2023-01-08 DIAGNOSIS — G4700 Insomnia, unspecified: Secondary | ICD-10-CM | POA: Diagnosis not present

## 2023-01-08 DIAGNOSIS — J302 Other seasonal allergic rhinitis: Secondary | ICD-10-CM | POA: Diagnosis not present

## 2023-01-08 DIAGNOSIS — I10 Essential (primary) hypertension: Secondary | ICD-10-CM | POA: Diagnosis not present

## 2023-01-08 DIAGNOSIS — L989 Disorder of the skin and subcutaneous tissue, unspecified: Secondary | ICD-10-CM | POA: Diagnosis not present

## 2023-01-08 DIAGNOSIS — K219 Gastro-esophageal reflux disease without esophagitis: Secondary | ICD-10-CM | POA: Diagnosis not present

## 2023-01-08 DIAGNOSIS — R42 Dizziness and giddiness: Secondary | ICD-10-CM | POA: Diagnosis not present

## 2023-01-08 DIAGNOSIS — K589 Irritable bowel syndrome without diarrhea: Secondary | ICD-10-CM | POA: Diagnosis not present

## 2023-01-08 DIAGNOSIS — M79621 Pain in right upper arm: Secondary | ICD-10-CM | POA: Diagnosis not present

## 2023-01-08 DIAGNOSIS — M542 Cervicalgia: Secondary | ICD-10-CM | POA: Diagnosis not present

## 2023-01-08 DIAGNOSIS — N289 Disorder of kidney and ureter, unspecified: Secondary | ICD-10-CM | POA: Diagnosis not present

## 2023-01-14 ENCOUNTER — Encounter (INDEPENDENT_AMBULATORY_CARE_PROVIDER_SITE_OTHER): Payer: Self-pay | Admitting: Otolaryngology

## 2023-01-22 DIAGNOSIS — M25561 Pain in right knee: Secondary | ICD-10-CM | POA: Diagnosis not present

## 2023-01-23 ENCOUNTER — Ambulatory Visit: Payer: Self-pay | Admitting: Emergency Medicine

## 2023-01-23 DIAGNOSIS — G8929 Other chronic pain: Secondary | ICD-10-CM

## 2023-01-23 NOTE — H&P (Signed)
TOTAL KNEE ADMISSION H&P  Patient is being admitted for right total knee arthroplasty.  Subjective:  Chief Complaint:right knee pain.  HPI: Alexandra Hoffman, 73 y.o. female, has a history of pain and functional disability in the right knee due to arthritis and has failed non-surgical conservative treatments for greater than 12 weeks to includeNSAID's and/or analgesics, corticosteriod injections, viscosupplementation injections, and activity modification.  Onset of symptoms was gradual, starting >10 years ago with gradually worsening course since that time. The patient noted no past surgery on the right knee(s).  Patient currently rates pain in the right knee(s) at 8 out of 10 with activity. Patient has night pain, worsening of pain with activity and weight bearing, pain that interferes with activities of daily living, and pain with passive range of motion.  Patient has evidence of periarticular osteophytes and joint space narrowing by imaging studies.   There is no active infection.  Patient Active Problem List   Diagnosis Date Noted   Weakness 03/23/2021   Myalgia 03/23/2021   Acute sinusitis 01/16/2021   Epidermoid cyst of skin of thigh 11/10/2020   Hyperlipidemia 06/12/2020   Vitamin D deficiency 06/12/2020   Bloating 06/22/2019   Constipation 01/31/2017   History of colonic polyps 01/31/2017   Essential hypertension 02/06/2016   GERD without esophagitis 02/06/2016   Benign colon polyp 02/06/2016   Anxiety with depression 02/06/2016   Osteoarthritis of both knees 02/06/2016   Benzodiazepine dependence, continuous (HCC) 02/06/2016   Past Medical History:  Diagnosis Date   Anemia    In childhood   Anxiety    Arthritis    knees   Constipation    Depression    GERD (gastroesophageal reflux disease)    Hyperlipidemia 06/12/2020   Hypertension     Past Surgical History:  Procedure Laterality Date   COLON SURGERY  2006   perforated sigmoid diverticulitis, Dr Lovell Sheehan    COLONOSCOPY  2006   Dr. Jena Gauss: normal rectum and a few sigmoid diverticula, 7 mm angry pedunculated polyp at 30 cm, 4 mm sessile polyp base of cecum. Unknown path    COLONOSCOPY WITH PROPOFOL N/A 03/04/2017   Procedure: COLONOSCOPY WITH PROPOFOL;  Surgeon: Corbin Ade, MD;  [facility]: [facility];  Service: Endoscopy;  Laterality: N/A;  7:30am   COLONOSCOPY WITH PROPOFOL N/A 04/07/2020   Procedure: COLONOSCOPY WITH PROPOFOL;  Surgeon: Corbin Ade, MD;  Location: [facility];  Service: Endoscopy;  Laterality: N/A;  AM (wants early as possible due to nausea)   ESOPHAGOGASTRODUODENOSCOPY (EGD) WITH PROPOFOL N/A 03/19/2019   Rourk: Gastric polyps (biopsy showed fundic gland), small hiatal hernia.   POLYPECTOMY  03/04/2017   Procedure: POLYPECTOMY;  Surgeon: Corbin Ade, MD;  Location: [facility];  Service: Endoscopy;;  polyp at cecum and left colon   POLYPECTOMY  03/19/2019   Procedure: POLYPECTOMY;  Surgeon: Corbin Ade, MD;  Location: [facility];  Service: Endoscopy;;  gastric   POLYPECTOMY  04/07/2020   Procedure: POLYPECTOMY;  Surgeon: Corbin Ade, MD;  Location: [facility];  Service: Endoscopy;;   TOTAL KNEE ARTHROPLASTY Left 01/22/2022   Procedure: LEFT TOTAL KNEE ARTHROPLASTY;  Surgeon: Joen Laura, MD;  Location: [facility];  Service: Orthopedics;  Laterality: Left;    Current Outpatient Medications  Medication Sig Dispense Refill Last Dose/Taking   ALPRAZolam (XANAX) 0.5 MG tablet Take 0.5 mg by mouth at bedtime.      carboxymethylcellulose (REFRESH PLUS) 0.5 % SOLN Place 1 drop into  both eyes daily.      DULoxetine (CYMBALTA) 30 MG capsule Take 1 capsule (30 mg total) by mouth daily. 30 capsule 6    gabapentin (NEURONTIN) 100 MG capsule Take 100 mg by mouth 3 (three) times daily.      levocetirizine (XYZAL) 5 MG tablet Take 5 mg by mouth at bedtime.      LINZESS 72 MCG capsule TAKE ONE CAPSULE BY MOUTH ONCE DAILY WITH BREAKFAST. 30 capsule 5    lisinopril  (PRINIVIL,ZESTRIL) 10 MG tablet Take 10 mg by mouth in the morning.      meloxicam (MOBIC) 15 MG tablet Take 15 mg by mouth daily.      Omega-3 Fatty Acids (FISH OIL) 1200 MG CAPS Take 1,200 mg by mouth in the morning.      pantoprazole (PROTONIX) 40 MG tablet TAKE ONE TABLET BY MOUTH ONCE DAILY BEFORE BREAKFAST. 30 tablet 3    valACYclovir (VALTREX) 1000 MG tablet SMARTSIG:1 Tablet(s) By Mouth Every 12 Hours      vitamin B-12 (CYANOCOBALAMIN) 1000 MCG tablet Take 1,000 mcg by mouth in the morning.      No current facility-administered medications for this visit.   No Known Allergies  Social History   Tobacco Use   Smoking status: Never   Smokeless tobacco: Never  Substance Use Topics   Alcohol use: No    Family History  Problem Relation Age of Onset   Arthritis Mother    Depression Mother    Diabetes Mother    Heart disease Mother    Hypertension Mother    Stroke Mother    Kidney disease Mother 61       dialysis   Alcohol abuse Father    Arthritis Father    Cancer Father 32       brain   Hypertension Father    Hypertension Sister    Arthritis Sister    Hypertension Brother    Arthritis Brother    Hypertension Brother    Arthritis Brother    Hypertension Brother    Hypertension Brother    Hypertension Brother    Hypertension Brother    Other Brother        MVA   Colon cancer Neg Hx      Review of Systems  Musculoskeletal:  Positive for arthralgias.  All other systems reviewed and are negative.   Objective:  Physical Exam Constitutional:      General: She is not in acute distress.    Appearance: Normal appearance. She is not ill-appearing.  HENT:     Head: Normocephalic and atraumatic.     Right Ear: External ear normal.     Left Ear: External ear normal.     Nose: Nose normal.     Mouth/Throat:     Mouth: Mucous membranes are moist.     Pharynx: Oropharynx is clear.  Eyes:     Extraocular Movements: Extraocular movements intact.      Conjunctiva/sclera: Conjunctivae normal.  Cardiovascular:     Rate and Rhythm: Normal rate and regular rhythm.     Pulses: Normal pulses.     Heart sounds: Normal heart sounds.  Pulmonary:     Effort: Pulmonary effort is normal.     Breath sounds: Normal breath sounds.  Abdominal:     General: Bowel sounds are normal.     Palpations: Abdomen is soft.     Tenderness: There is no abdominal tenderness.  Musculoskeletal:        General:  Tenderness present.     Cervical back: Normal range of motion and neck supple.     Comments: TTP over medial and lateral joint line.  No calf tenderness, swelling, or erythema.  No overlying lesions of area of chief complaint.  Decreased strength and ROM due to elicited pain.  Pre-operative ROM 0-120.  Dorsiflexion and plantarflexion intact.  Stable to varus and valgus stress.  BLE appear grossly neurovascularly intact.  Gait mildly antalgic.   Skin:    General: Skin is warm and dry.  Neurological:     Mental Status: She is alert and oriented to person, place, and time. Mental status is at baseline.  Psychiatric:        Mood and Affect: Mood normal.        Behavior: Behavior normal.     Vital signs in last 24 hours: @VSRANGES @  Labs:   Estimated body mass index is 34.35 kg/m as calculated from the following:   Height as of 07/16/22: 5\' 5"  (1.651 m).   Weight as of 07/16/22: 93.6 kg.   Imaging Review Plain radiographs demonstrate severe degenerative joint disease of the right knee(s). The overall alignment ismild valgus. The bone quality appears to be fair for age and reported activity level.      Assessment/Plan:  End stage arthritis, right knee   The patient history, physical examination, clinical judgment of the provider and imaging studies are consistent with end stage degenerative joint disease of the right knee(s) and total knee arthroplasty is deemed medically necessary. The treatment options including medical management, injection  therapy arthroscopy and arthroplasty were discussed at length. The risks and benefits of total knee arthroplasty were presented and reviewed. The risks due to aseptic loosening, infection, stiffness, patella tracking problems, thromboembolic complications and other imponderables were discussed. The patient acknowledged the explanation, agreed to proceed with the plan and consent was signed. Patient is being admitted for inpatient treatment for surgery, pain control, PT, OT, prophylactic antibiotics, VTE prophylaxis, progressive ambulation and ADL's and discharge planning. The patient is planning to be discharged home with outpatient PT     Patient's anticipated LOS is less than 2 midnights, meeting these requirements: - Lives within 1 hour of care - Has a competent adult at home to recover with post-op recover - NO history of  - Chronic pain requiring opiods  - Diabetes  - Coronary Artery Disease  - Heart failure  - Heart attack  - Stroke  - DVT/VTE  - Cardiac arrhythmia  - Respiratory Failure/COPD  - Renal failure  - Anemia  - Advanced Liver disease

## 2023-01-24 DIAGNOSIS — D225 Melanocytic nevi of trunk: Secondary | ICD-10-CM | POA: Diagnosis not present

## 2023-01-24 DIAGNOSIS — L821 Other seborrheic keratosis: Secondary | ICD-10-CM | POA: Diagnosis not present

## 2023-01-24 DIAGNOSIS — L918 Other hypertrophic disorders of the skin: Secondary | ICD-10-CM | POA: Diagnosis not present

## 2023-01-30 NOTE — Patient Instructions (Addendum)
SURGICAL WAITING ROOM VISITATION Patients having surgery or a procedure may have no more than 2 support people in the waiting area - these visitors may rotate.    Children under the age of 98 must have an adult with them who is not the patient.  Due to an increase in RSV and influenza rates and associated hospitalizations, children ages 50 and under may not visit patients in [facility] hospitals.   If the patient needs to stay at the hospital during part of their recovery, the visitor guidelines for inpatient rooms apply. Pre-op nurse will coordinate an appropriate time for 1 support person to accompany patient in pre-op.  This support person may not rotate.    Please refer to the [facility] website for the visitor guidelines for Inpatients (after your surgery is over and you are in a regular room).       Your procedure is scheduled on: 02-11-23   Report to [facility] Main Entrance    Report to admitting at 5:15 AM   Call this number if you have problems the morning of surgery [phone]   Do not eat food :After Midnight.   After Midnight you may have the following liquids until 4:30 AM DAY OF SURGERY  Water Non-Citrus Juices (without pulp, NO RED-Apple, White grape, White cranberry) Black Coffee (NO MILK/CREAM OR CREAMERS, sugar ok)  Clear Tea (NO MILK/CREAM OR CREAMERS, sugar ok) regular and decaf                             Plain Jell-O (NO RED)                                           Fruit ices (not with fruit pulp, NO RED)                                     Popsicles (NO RED)                                                               Sports drinks like Gatorade (NO RED)                   The day of surgery:  Drink ONE (1) Pre-Surgery Clear Ensure by 4:30 AM the morning of surgery. Drink in one sitting. Do not sip.  This drink was given to you during your hospital  pre-op appointment visit. Nothing else to drink after completing the Pre-Surgery Clear  Ensure           If you have questions, please contact your surgeon's office.   FOLLOW  ANY ADDITIONAL PRE OP INSTRUCTIONS YOU RECEIVED FROM YOUR SURGEON'S OFFICE!!!     Oral Hygiene is also important to reduce your risk of infection.                                    Remember - BRUSH YOUR TEETH THE MORNING OF SURGERY WITH YOUR REGULAR TOOTHPASTE  Do NOT smoke after Midnight   Take these medicines the morning of surgery with A SIP OF WATER:   Duloxetine  Gabapentin  Pantoprazole  Alprazolam if needed  Okay to use eyedrops  Stop all vitamins and herbal supplements 7 days before surgery  Bring CPAP mask and tubing day of surgery.                              You may not have any metal on your body including hair pins, jewelry, and body piercing             Do not wear make-up, lotions, powders, perfumes or deodorant  Do not wear nail polish including gel and S&S, artificial/acrylic nails, or any other type of covering on natural nails including finger and toenails. If you have artificial nails, gel coating, etc. that needs to be removed by a nail salon please have this removed prior to surgery or surgery may need to be canceled/ delayed if the surgeon/ anesthesia feels like they are unable to be safely monitored.   Do not shave  48 hours prior to surgery.          Do not bring valuables to the hospital. [facility] IS NOT RESPONSIBLE   FOR VALUABLES.   Contacts, dentures or bridgework may not be worn into surgery.  DO NOT BRING YOUR HOME MEDICATIONS TO THE HOSPITAL. PHARMACY WILL DISPENSE MEDICATIONS LISTED ON YOUR MEDICATION LIST TO YOU DURING YOUR ADMISSION IN THE HOSPITAL!    Patients discharged on the day of surgery will not be allowed to drive home.  Someone NEEDS to stay with you for the first 24 hours after anesthesia.   Special Instructions: Bring a copy of your healthcare power of attorney and living will documents the day of surgery if you haven't scanned them  before.              Please read over the following fact sheets you were given: IF YOU HAVE QUESTIONS ABOUT YOUR PRE-OP INSTRUCTIONS PLEASE CALL [phone] Gwen  If you received a COVID test during your pre-op visit  it is requested that you wear a mask when out in public, stay away from anyone that may not be feeling well and notify your surgeon if you develop symptoms. If you test positive for Covid or have been in contact with anyone that has tested positive in the last 10 days please notify you surgeon.    Pre-operative 5 CHG Bath Instructions   You can play a key role in reducing the risk of infection after surgery. Your skin needs to be as free of germs as possible. You can reduce the number of germs on your skin by washing with CHG (chlorhexidine gluconate) soap before surgery. CHG is an antiseptic soap that kills germs and continues to kill germs even after washing.   DO NOT use if you have an allergy to chlorhexidine/CHG or antibacterial soaps. If your skin becomes reddened or irritated, stop using the CHG and notify one of our RNs at [phone].   Please shower with the CHG soap starting 4 days before surgery using the following schedule:     Please keep in mind the following:  DO NOT shave, including legs and underarms, starting the day of your first shower.   You may shave your face at any point before/day of surgery.  Place clean sheets on your bed the day you start using CHG  soap. Use a clean washcloth (not used since being washed) for each shower. DO NOT sleep with pets once you start using the CHG.   CHG Shower Instructions:  If you choose to wash your hair and private area, wash first with your normal shampoo/soap.  After you use shampoo/soap, rinse your hair and body thoroughly to remove shampoo/soap residue.  Turn the water OFF and apply about 3 tablespoons (45 ml) of CHG soap to a CLEAN washcloth.  Apply CHG soap ONLY FROM YOUR NECK DOWN TO YOUR TOES (washing for  3-5 minutes)  DO NOT use CHG soap on face, private areas, open wounds, or sores.  Pay special attention to the area where your surgery is being performed.  If you are having back surgery, having someone wash your back for you may be helpful. Wait 2 minutes after CHG soap is applied, then you may rinse off the CHG soap.  Pat dry with a clean towel  Put on clean clothes/pajamas   If you choose to wear lotion, please use ONLY the CHG-compatible lotions on the back of this paper.     Additional instructions for the day of surgery: DO NOT APPLY any lotions, deodorants, cologne, or perfumes.   Put on clean/comfortable clothes.  Brush your teeth.  Ask your nurse before applying any prescription medications to the skin.      CHG Compatible Lotions   Aveeno Moisturizing lotion  Cetaphil Moisturizing Cream  Cetaphil Moisturizing Lotion  Clairol Herbal Essence Moisturizing Lotion, Dry Skin  Clairol Herbal Essence Moisturizing Lotion, Extra Dry Skin  Clairol Herbal Essence Moisturizing Lotion, Normal Skin  Curel Age Defying Therapeutic Moisturizing Lotion with Alpha Hydroxy  Curel Extreme Care Body Lotion  Curel Soothing Hands Moisturizing Hand Lotion  Curel Therapeutic Moisturizing Cream, Fragrance-Free  Curel Therapeutic Moisturizing Lotion, Fragrance-Free  Curel Therapeutic Moisturizing Lotion, Original Formula  Eucerin Daily Replenishing Lotion  Eucerin Dry Skin Therapy Plus Alpha Hydroxy Crme  Eucerin Dry Skin Therapy Plus Alpha Hydroxy Lotion  Eucerin Original Crme  Eucerin Original Lotion  Eucerin Plus Crme Eucerin Plus Lotion  Eucerin TriLipid Replenishing Lotion  Keri Anti-Bacterial Hand Lotion  Keri Deep Conditioning Original Lotion Dry Skin Formula Softly Scented  Keri Deep Conditioning Original Lotion, Fragrance Free Sensitive Skin Formula  Keri Lotion Fast Absorbing Fragrance Free Sensitive Skin Formula  Keri Lotion Fast Absorbing Softly Scented Dry Skin Formula  Keri  Original Lotion  Keri Skin Renewal Lotion Keri Silky Smooth Lotion  Keri Silky Smooth Sensitive Skin Lotion  Nivea Body Creamy Conditioning Oil  Nivea Body Extra Enriched Lotion  Nivea Body Original Lotion  Nivea Body Sheer Moisturizing Lotion Nivea Crme  Nivea Skin Firming Lotion  NutraDerm 30 Skin Lotion  NutraDerm Skin Lotion  NutraDerm Therapeutic Skin Cream  NutraDerm Therapeutic Skin Lotion  ProShield Protective Hand Cream  Provon moisturizing lotion   PATIENT SIGNATURE_________________________________  NURSE SIGNATURE__________________________________  ________________________________________________________________________    Alexandra Hoffman  An incentive spirometer is a tool that can help keep your lungs clear and active. This tool measures how well you are filling your lungs with each breath. Taking long deep breaths may help reverse or decrease the chance of developing breathing (pulmonary) problems (especially infection) following: A long period of time when you are unable to move or be active. BEFORE THE PROCEDURE  If the spirometer includes an indicator to show your best effort, your nurse or respiratory therapist will set it to a desired goal. If possible, sit up straight or lean slightly forward.  Try not to slouch. Hold the incentive spirometer in an upright position. INSTRUCTIONS FOR USE  Sit on the edge of your bed if possible, or sit up as far as you can in bed or on a chair. Hold the incentive spirometer in an upright position. Breathe out normally. Place the mouthpiece in your mouth and seal your lips tightly around it. Breathe in slowly and as deeply as possible, raising the piston or the ball toward the top of the column. Hold your breath for 3-5 seconds or for as long as possible. Allow the piston or ball to fall to the bottom of the column. Remove the mouthpiece from your mouth and breathe out normally. Rest for a few seconds and repeat Steps 1  through 7 at least 10 times every 1-2 hours when you are awake. Take your time and take a few normal breaths between deep breaths. The spirometer may include an indicator to show your best effort. Use the indicator as a goal to work toward during each repetition. After each set of 10 deep breaths, practice coughing to be sure your lungs are clear. If you have an incision (the cut made at the time of surgery), support your incision when coughing by placing a pillow or rolled up towels firmly against it. Once you are able to get out of bed, walk around indoors and cough well. You may stop using the incentive spirometer when instructed by your caregiver.  RISKS AND COMPLICATIONS Take your time so you do not get dizzy or light-headed. If you are in pain, you may need to take or ask for pain medication before doing incentive spirometry. It is harder to take a deep breath if you are having pain. AFTER USE Rest and breathe slowly and easily. It can be helpful to keep track of a log of your progress. Your caregiver can provide you with a simple table to help with this. If you are using the spirometer at home, follow these instructions: SEEK MEDICAL CARE IF:  You are having difficultly using the spirometer. You have trouble using the spirometer as often as instructed. Your pain medication is not giving enough relief while using the spirometer. You develop fever of 100.5 F (38.1 C) or higher. SEEK IMMEDIATE MEDICAL CARE IF:  You cough up bloody sputum that had not been present before. You develop fever of 102 F (38.9 C) or greater. You develop worsening pain at or near the incision site. MAKE SURE YOU:  Understand these instructions. Will watch your condition. Will get help right away if you are not doing well or get worse. Document Released: 04/30/2006 Document Revised: 03/12/2011 Document Reviewed: 07/01/2006 [facility] Patient Information 2014 [place],  [place].   ________________________________________________________________________

## 2023-01-30 NOTE — Progress Notes (Addendum)
COVID Vaccine Completed:  Yes  Date of COVID positive in last 90 days:  PCP - Nita Sells, MD Cardiologist -   Medical clearance under media tab  Chest x-ray -  EKG -  Stress Test -  ECHO -  Cardiac Cath -  Pacemaker/ICD device last checked: Spinal Cord Stimulator:  Bowel Prep -   Sleep Study -  CPAP -   Fasting Blood Sugar -  Checks Blood Sugar _____ times a day  Last dose of GLP1 agonist-  N/A GLP1 instructions:  Hold 7 days before surgery    Last dose of SGLT-2 inhibitors-  N/A SGLT-2 instructions:  Hold 3 days before surgery    Blood Thinner Instructions:  Time Aspirin Instructions: Last Dose:  Activity level:  Can go up a flight of stairs and perform activities of daily living without stopping and without symptoms of chest pain or shortness of breath.  Able to exercise without symptoms  Unable to go up a flight of stairs without symptoms of     Anesthesia review:   Patient denies shortness of breath, fever, cough and chest pain at PAT appointment  Patient verbalized understanding of instructions that were given to them at the PAT appointment. Patient was also instructed that they will need to review over the PAT instructions again at home before surgery.

## 2023-01-31 NOTE — Care Plan (Signed)
Ortho Bundle Case Management Note  Patient Details  Name: Alexandra Hoffman MRN: [id number] Date of Birth: 10-Apr-1950  met with patient in the office for H&P. will discharge to home with family to assist. has RW, CPM ordered. OPPT set up with [facility]. discharge instructions discussed and questions answered. Patient and MD in agreement with plan. Choice offered                     DME Arranged:  CPM DME Agency:  [employer]  HH Arranged:    HH Agency:     Additional Comments: Please contact me with any questions of if this plan should need to change.  Shauna Hugh,  RN,BSN,MHA,CCM  [facility] Orthopaedic Specialist  [phone] 01/31/2023, 8:11 AM

## 2023-02-01 ENCOUNTER — Other Ambulatory Visit: Payer: Self-pay

## 2023-02-01 ENCOUNTER — Encounter (HOSPITAL_COMMUNITY): Payer: Self-pay

## 2023-02-01 ENCOUNTER — Encounter (HOSPITAL_COMMUNITY)
Admission: RE | Admit: 2023-02-01 | Discharge: 2023-02-01 | Disposition: A | Discharge status: Home / Self Care | Payer: 59 | Source: Ambulatory Visit | Attending: Orthopedic Surgery | Admitting: Orthopedic Surgery

## 2023-02-01 VITALS — BP 126/69 | HR 85 | Temp 98.4°F | Ht 65.0 in | Wt 202.0 lb

## 2023-02-01 DIAGNOSIS — G8929 Other chronic pain: Secondary | ICD-10-CM | POA: Diagnosis not present

## 2023-02-01 DIAGNOSIS — Z01818 Encounter for other preprocedural examination: Secondary | ICD-10-CM | POA: Insufficient documentation

## 2023-02-01 DIAGNOSIS — I10 Essential (primary) hypertension: Secondary | ICD-10-CM | POA: Insufficient documentation

## 2023-02-01 DIAGNOSIS — M25561 Pain in right knee: Secondary | ICD-10-CM | POA: Insufficient documentation

## 2023-02-01 DIAGNOSIS — R9431 Abnormal electrocardiogram [ECG] [EKG]: Secondary | ICD-10-CM | POA: Insufficient documentation

## 2023-02-01 LAB — COMPREHENSIVE METABOLIC PANEL
ALT: 11 U/L (ref 0–44)
AST: 13 U/L — ABNORMAL LOW (ref 15–41)
Albumin: 3.9 g/dL (ref 3.5–5.0)
Alkaline Phosphatase: 54 U/L (ref 38–126)
Anion gap: 10 (ref 5–15)
BUN: 22 mg/dL (ref 8–23)
CO2: 24 mmol/L (ref 22–32)
Calcium: 9.5 mg/dL (ref 8.9–10.3)
Chloride: 107 mmol/L (ref 98–111)
Creatinine, Ser: 1.03 mg/dL — ABNORMAL HIGH (ref 0.44–1.00)
GFR, Estimated: 58 mL/min — ABNORMAL LOW (ref >60)
Glucose, Bld: 94 mg/dL (ref 70–99)
Potassium: 4.4 mmol/L (ref 3.5–5.1)
Sodium: 141 mmol/L (ref 135–145)
Total Bilirubin: 0.7 mg/dL (ref 0.0–1.2)
Total Protein: 7 g/dL (ref 6.5–8.1)

## 2023-02-01 LAB — CBC WITH DIFFERENTIAL/PLATELET
Abs Immature Granulocytes: 0.01 10*3/uL (ref 0.00–0.07)
Basophils Absolute: 0.1 10*3/uL (ref 0.0–0.1)
Basophils Relative: 1 %
Eosinophils Absolute: 0.1 10*3/uL (ref 0.0–0.5)
Eosinophils Relative: 3 %
HCT: 45.1 % (ref 36.0–46.0)
Hemoglobin: 14.7 g/dL (ref 12.0–15.0)
Immature Granulocytes: 0 %
Lymphocytes Relative: 34 %
Lymphs Abs: 1.6 10*3/uL (ref 0.7–4.0)
MCH: 28.9 pg (ref 26.0–34.0)
MCHC: 32.6 g/dL (ref 30.0–36.0)
MCV: 88.6 fL (ref 80.0–100.0)
Monocytes Absolute: 0.4 10*3/uL (ref 0.1–1.0)
Monocytes Relative: 9 %
Neutro Abs: 2.5 10*3/uL (ref 1.7–7.7)
Neutrophils Relative %: 53 %
Platelets: 222 10*3/uL (ref 150–400)
RBC: 5.09 MIL/uL (ref 3.87–5.11)
RDW: 13.2 % (ref 11.5–15.5)
WBC: 4.7 10*3/uL (ref 4.0–10.5)
nRBC: 0 % (ref 0.0–0.2)

## 2023-02-01 LAB — SURGICAL PCR SCREEN
MRSA, PCR: NEGATIVE
Staphylococcus aureus: NEGATIVE

## 2023-02-11 ENCOUNTER — Encounter (HOSPITAL_COMMUNITY): Payer: Self-pay | Admitting: Orthopedic Surgery

## 2023-02-11 ENCOUNTER — Ambulatory Visit (HOSPITAL_COMMUNITY): Payer: 59

## 2023-02-11 ENCOUNTER — Encounter (HOSPITAL_COMMUNITY)
Admission: RE | Disposition: A | Discharge status: Home / Self Care | Payer: Self-pay | Source: Home / Self Care | Attending: Orthopedic Surgery

## 2023-02-11 ENCOUNTER — Ambulatory Visit (HOSPITAL_COMMUNITY)
Admission: RE | Admit: 2023-02-11 | Discharge: 2023-02-11 | Disposition: A | Discharge status: Home / Self Care | Payer: 59 | Attending: Orthopedic Surgery | Admitting: Orthopedic Surgery

## 2023-02-11 ENCOUNTER — Other Ambulatory Visit: Payer: Self-pay

## 2023-02-11 ENCOUNTER — Ambulatory Visit (HOSPITAL_COMMUNITY): Payer: 59 | Admitting: Anesthesiology

## 2023-02-11 DIAGNOSIS — Z96651 Presence of right artificial knee joint: Secondary | ICD-10-CM | POA: Diagnosis not present

## 2023-02-11 DIAGNOSIS — R609 Edema, unspecified: Secondary | ICD-10-CM | POA: Diagnosis not present

## 2023-02-11 DIAGNOSIS — M1711 Unilateral primary osteoarthritis, right knee: Secondary | ICD-10-CM | POA: Diagnosis not present

## 2023-02-11 DIAGNOSIS — G8918 Other acute postprocedural pain: Secondary | ICD-10-CM | POA: Diagnosis not present

## 2023-02-11 DIAGNOSIS — Z471 Aftercare following joint replacement surgery: Secondary | ICD-10-CM | POA: Diagnosis not present

## 2023-02-11 HISTORY — PX: TOTAL KNEE ARTHROPLASTY: SHX125

## 2023-02-11 LAB — TYPE AND SCREEN
ABO/RH(D): A POS
Antibody Screen: NEGATIVE

## 2023-02-11 LAB — ABO/RH: ABO/RH(D): A POS

## 2023-02-11 SURGERY — ARTHROPLASTY, KNEE, TOTAL
Anesthesia: Monitor Anesthesia Care | Site: Knee | Laterality: Right

## 2023-02-11 MED ORDER — ORAL CARE MOUTH RINSE: 15.0000 mL | Freq: Once | OROMUCOSAL | Status: AC

## 2023-02-11 MED ORDER — PROPOFOL 1000 MG/100ML IV EMUL
INTRAVENOUS | Status: AC
  Filled 2023-02-11: qty 100

## 2023-02-11 MED ORDER — LACTATED RINGERS IV BOLUS
250.0000 mL | Freq: Once | INTRAVENOUS | Status: AC
  Administered 2023-02-11: 250 mL via INTRAVENOUS

## 2023-02-11 MED ORDER — MIDAZOLAM HCL 5 MG/5ML IJ SOLN
INTRAMUSCULAR | Status: DC | PRN
  Administered 2023-02-11 (×2): 1 mg via INTRAVENOUS

## 2023-02-11 MED ORDER — FENTANYL CITRATE (PF) 100 MCG/2ML IJ SOLN
INTRAMUSCULAR | Status: DC | PRN
  Administered 2023-02-11 (×2): 50 ug via INTRAVENOUS

## 2023-02-11 MED ORDER — PHENYLEPHRINE HCL-NACL 20-0.9 MG/250ML-% IV SOLN
INTRAVENOUS | Status: DC | PRN
  Administered 2023-02-11: 40 ug/min via INTRAVENOUS

## 2023-02-11 MED ORDER — BUPIVACAINE LIPOSOME 1.3 % IJ SUSP
INTRAMUSCULAR | Status: AC
  Filled 2023-02-11: qty 20

## 2023-02-11 MED ORDER — ACETAMINOPHEN 500 MG PO TABS: 1000.0000 mg | ORAL_TABLET | Freq: Three times a day (TID) | ORAL | Status: AC | PRN

## 2023-02-11 MED ORDER — ONDANSETRON HCL 4 MG/2ML IJ SOLN: 4.0000 mg | Freq: Four times a day (QID) | INTRAMUSCULAR | Status: DC | PRN

## 2023-02-11 MED ORDER — 0.9 % SODIUM CHLORIDE (POUR BTL) OPTIME
TOPICAL | Status: DC | PRN
  Administered 2023-02-11: 1000 mL

## 2023-02-11 MED ORDER — METHOCARBAMOL 500 MG PO TABS: 500.0000 mg | ORAL_TABLET | Freq: Three times a day (TID) | ORAL | 0 refills | Status: AC | PRN

## 2023-02-11 MED ORDER — PHENYLEPHRINE 80 MCG/ML (10ML) SYRINGE FOR IV PUSH (FOR BLOOD PRESSURE SUPPORT)
PREFILLED_SYRINGE | INTRAVENOUS | Status: DC | PRN
  Administered 2023-02-11 (×2): 80 ug via INTRAVENOUS
  Administered 2023-02-11 (×3): 120 ug via INTRAVENOUS

## 2023-02-11 MED ORDER — PROPOFOL 500 MG/50ML IV EMUL
INTRAVENOUS | Status: AC
  Filled 2023-02-11: qty 50

## 2023-02-11 MED ORDER — ISOPROPYL ALCOHOL 70 % SOLN
Status: DC | PRN
  Administered 2023-02-11: 1 via TOPICAL

## 2023-02-11 MED ORDER — CELECOXIB 100 MG PO CAPS: 100.0000 mg | ORAL_CAPSULE | Freq: Two times a day (BID) | ORAL | 0 refills | Status: AC

## 2023-02-11 MED ORDER — OXYCODONE HCL 5 MG PO TABS: 2.5000 mg | ORAL_TABLET | Freq: Four times a day (QID) | ORAL | 0 refills | Status: AC | PRN

## 2023-02-11 MED ORDER — SODIUM CHLORIDE (PF) 0.9 % IJ SOLN
INTRAMUSCULAR | Status: AC
  Filled 2023-02-11: qty 50

## 2023-02-11 MED ORDER — BUPIVACAINE-EPINEPHRINE 0.25% -1:200000 IJ SOLN
INTRAMUSCULAR | Status: AC
  Filled 2023-02-11: qty 1

## 2023-02-11 MED ORDER — OXYCODONE HCL 5 MG/5ML PO SOLN: 5.0000 mg | Freq: Once | ORAL | Status: AC | PRN

## 2023-02-11 MED ORDER — OXYCODONE HCL 5 MG PO TABS
ORAL_TABLET | ORAL | Status: AC
  Filled 2023-02-11: qty 1

## 2023-02-11 MED ORDER — BUPIVACAINE LIPOSOME 1.3 % IJ SUSP: 20.0000 mL | Freq: Once | INTRAMUSCULAR | Status: DC

## 2023-02-11 MED ORDER — DEXAMETHASONE SODIUM PHOSPHATE 10 MG/ML IJ SOLN
8.0000 mg | Freq: Once | INTRAMUSCULAR | Status: AC
  Administered 2023-02-11: 8 mg via INTRAVENOUS

## 2023-02-11 MED ORDER — SODIUM CHLORIDE (PF) 0.9 % IJ SOLN
INTRAMUSCULAR | Status: DC | PRN
  Administered 2023-02-11: 80 mL

## 2023-02-11 MED ORDER — DEXAMETHASONE SODIUM PHOSPHATE 10 MG/ML IJ SOLN
INTRAMUSCULAR | Status: AC
  Filled 2023-02-11: qty 1

## 2023-02-11 MED ORDER — ONDANSETRON HCL 4 MG/2ML IJ SOLN
INTRAMUSCULAR | Status: AC
  Filled 2023-02-11: qty 2

## 2023-02-11 MED ORDER — TRANEXAMIC ACID-NACL 1000-0.7 MG/100ML-% IV SOLN
1000.0000 mg | INTRAVENOUS | Status: AC
  Administered 2023-02-11: 1000 mg via INTRAVENOUS
  Filled 2023-02-11: qty 100

## 2023-02-11 MED ORDER — CEFAZOLIN SODIUM-DEXTROSE 2-4 GM/100ML-% IV SOLN
2.0000 g | INTRAVENOUS | Status: AC
  Administered 2023-02-11: 2 g via INTRAVENOUS
  Filled 2023-02-11: qty 100

## 2023-02-11 MED ORDER — OXYCODONE HCL 5 MG PO TABS
5.0000 mg | ORAL_TABLET | Freq: Once | ORAL | Status: AC | PRN
  Administered 2023-02-11: 5 mg via ORAL

## 2023-02-11 MED ORDER — POVIDONE-IODINE 10 % EX SWAB: 2.0000 | Freq: Once | CUTANEOUS | Status: DC

## 2023-02-11 MED ORDER — HYDROMORPHONE HCL 2 MG/ML IJ SOLN
INTRAMUSCULAR | Status: AC
  Filled 2023-02-11: qty 1

## 2023-02-11 MED ORDER — MIDAZOLAM HCL 2 MG/2ML IJ SOLN
INTRAMUSCULAR | Status: AC
  Filled 2023-02-11: qty 2

## 2023-02-11 MED ORDER — HYDROMORPHONE HCL 1 MG/ML IJ SOLN
INTRAMUSCULAR | Status: DC | PRN
  Administered 2023-02-11: .5 mg via INTRAVENOUS
  Administered 2023-02-11 (×3): .2 mg via INTRAVENOUS
  Administered 2023-02-11: .4 mg via INTRAVENOUS
  Administered 2023-02-11: .2 mg via INTRAVENOUS

## 2023-02-11 MED ORDER — ACETAMINOPHEN 500 MG PO TABS
1000.0000 mg | ORAL_TABLET | Freq: Once | ORAL | Status: AC
  Administered 2023-02-11: 1000 mg via ORAL
  Filled 2023-02-11: qty 2

## 2023-02-11 MED ORDER — ONDANSETRON HCL 4 MG PO TABS: 4.0000 mg | ORAL_TABLET | Freq: Three times a day (TID) | ORAL | 0 refills | Status: AC | PRN

## 2023-02-11 MED ORDER — SODIUM CHLORIDE 0.9 % IR SOLN
Status: DC | PRN
  Administered 2023-02-11: 1000 mL

## 2023-02-11 MED ORDER — EPHEDRINE 5 MG/ML INJ
INTRAVENOUS | Status: AC
  Filled 2023-02-11: qty 5

## 2023-02-11 MED ORDER — LACTATED RINGERS IV SOLN: INTRAVENOUS | Status: DC

## 2023-02-11 MED ORDER — PHENYLEPHRINE HCL-NACL 20-0.9 MG/250ML-% IV SOLN
INTRAVENOUS | Status: AC
  Filled 2023-02-11: qty 250

## 2023-02-11 MED ORDER — FENTANYL CITRATE PF 50 MCG/ML IJ SOSY: 25.0000 ug | PREFILLED_SYRINGE | INTRAMUSCULAR | Status: DC | PRN

## 2023-02-11 MED ORDER — LACTATED RINGERS IV BOLUS
500.0000 mL | Freq: Once | INTRAVENOUS | Status: AC
  Administered 2023-02-11: 500 mL via INTRAVENOUS

## 2023-02-11 MED ORDER — ROPIVACAINE HCL 5 MG/ML IJ SOLN
INTRAMUSCULAR | Status: DC | PRN
  Administered 2023-02-11: 25 mL via PERINEURAL

## 2023-02-11 MED ORDER — SODIUM CHLORIDE 0.9 % IV SOLN: INTRAVENOUS | Status: DC

## 2023-02-11 MED ORDER — FENTANYL CITRATE (PF) 100 MCG/2ML IJ SOLN
INTRAMUSCULAR | Status: AC
  Filled 2023-02-11: qty 2

## 2023-02-11 MED ORDER — PROPOFOL 500 MG/50ML IV EMUL
INTRAVENOUS | Status: DC | PRN
  Administered 2023-02-11: 200 mg via INTRAVENOUS
  Administered 2023-02-11: 100 ug/kg/min via INTRAVENOUS

## 2023-02-11 MED ORDER — WATER FOR IRRIGATION, STERILE IR SOLN
Status: DC | PRN
  Administered 2023-02-11: 1000 mL

## 2023-02-11 MED ORDER — POLYETHYLENE GLYCOL 3350 17 G PO PACK: 17.0000 g | PACK | Freq: Every day | ORAL | 0 refills | Status: DC

## 2023-02-11 MED ORDER — CHLORHEXIDINE GLUCONATE 0.12 % MT SOLN
15.0000 mL | Freq: Once | OROMUCOSAL | Status: AC
  Administered 2023-02-11: 15 mL via OROMUCOSAL

## 2023-02-11 MED ORDER — ASPIRIN 81 MG PO TBEC: 81.0000 mg | DELAYED_RELEASE_TABLET | Freq: Two times a day (BID) | ORAL | Status: AC

## 2023-02-11 MED ORDER — ONDANSETRON HCL 4 MG/2ML IJ SOLN
INTRAMUSCULAR | Status: DC | PRN
  Administered 2023-02-11: 4 mg via INTRAVENOUS

## 2023-02-11 SURGICAL SUPPLY — 55 items
BAG COUNTER SPONGE SURGICOUNT (BAG) IMPLANT
BLADE SAG 18X100X1.27 (BLADE) ×1 IMPLANT
BLADE SAW SAG 35X64 .89 (BLADE) ×1 IMPLANT
BLADE SAW SGTL 11.0X1.19X90.0M (BLADE) IMPLANT
BNDG COHESIVE 3X5 TAN ST LF (GAUZE/BANDAGES/DRESSINGS) ×1 IMPLANT
BNDG ELASTIC 6X10 VLCR STRL LF (GAUZE/BANDAGES/DRESSINGS) ×1 IMPLANT
BOWL SMART MIX CTS (DISPOSABLE) ×1 IMPLANT
CEMENT BONE R 1X40 (Cement) IMPLANT
CEMENT BONE REFOBACIN R1X40 US (Cement) IMPLANT
CHLORAPREP W/TINT 26 (MISCELLANEOUS) ×2 IMPLANT
COMP FEM CMT CR PERS SZ8 RT (Joint) ×1 IMPLANT
COMPONENT FEM CMT CR PRS SZ8RT (Joint) IMPLANT
COVER SURGICAL LIGHT HANDLE (MISCELLANEOUS) ×1 IMPLANT
CUFF TRNQT CYL 34X4.125X (TOURNIQUET CUFF) ×1 IMPLANT
DERMABOND ADVANCED .7 DNX12 (GAUZE/BANDAGES/DRESSINGS) ×1 IMPLANT
DRAPE INCISE IOBAN 85X60 (DRAPES) ×1 IMPLANT
DRAPE SHEET LG 3/4 BI-LAMINATE (DRAPES) ×1 IMPLANT
DRAPE U-SHAPE 47X51 STRL (DRAPES) ×1 IMPLANT
DRSG AQUACEL AG ADV 3.5X10 (GAUZE/BANDAGES/DRESSINGS) ×1 IMPLANT
ELECT REM PT RETURN 15FT ADLT (MISCELLANEOUS) ×1 IMPLANT
GAUZE SPONGE 4X4 12PLY STRL (GAUZE/BANDAGES/DRESSINGS) ×1 IMPLANT
GLOVE BIO SURGEON STRL SZ 6.5 (GLOVE) ×2 IMPLANT
GLOVE BIOGEL PI IND STRL 6.5 (GLOVE) ×1 IMPLANT
GLOVE BIOGEL PI IND STRL 8 (GLOVE) ×1 IMPLANT
GLOVE SURG ORTHO 8.0 STRL STRW (GLOVE) ×2 IMPLANT
GOWN STRL REUS W/ TWL XL LVL3 (GOWN DISPOSABLE) ×2 IMPLANT
HOLDER FOLEY CATH W/STRAP (MISCELLANEOUS) ×1 IMPLANT
HOOD PEEL AWAY T7 (MISCELLANEOUS) ×3 IMPLANT
INSERT TIB ASF CD/8-9 12 RT (Insert) IMPLANT
KIT TURNOVER KIT A (KITS) IMPLANT
MANIFOLD NEPTUNE II (INSTRUMENTS) ×1 IMPLANT
MARKER SKIN DUAL TIP RULER LAB (MISCELLANEOUS) ×1 IMPLANT
NS IRRIG 1000ML POUR BTL (IV SOLUTION) ×1 IMPLANT
PACK TOTAL KNEE CUSTOM (KITS) ×1 IMPLANT
PIN DRILL HDLS TROCAR 75 4PK (PIN) IMPLANT
SCREW HEADED 33MM KNEE (MISCELLANEOUS) IMPLANT
SET HNDPC FAN SPRY TIP SCT (DISPOSABLE) ×1 IMPLANT
SOLUTION IRRIG SURGIPHOR (IV SOLUTION) IMPLANT
SPIKE FLUID TRANSFER (MISCELLANEOUS) ×1 IMPLANT
STEM POLY PAT PLY 32M KNEE (Knees) IMPLANT
STEM TIBIA 5 DEG SZ D R KNEE (Knees) IMPLANT
STRIP CLOSURE SKIN 1/2X4 (GAUZE/BANDAGES/DRESSINGS) ×1 IMPLANT
SUT MNCRL AB 3-0 PS2 18 (SUTURE) ×1 IMPLANT
SUT STRATAFIX 0 PDS 27 VIOLET (SUTURE) ×1
SUT STRATAFIX 14 PDO 48 VLT (SUTURE) ×1 IMPLANT
SUT STRATAFIX PDO 1 14 VIOLET (SUTURE) ×1
SUT STRATAFIX PDS+ 0 24IN (SUTURE) IMPLANT
SUT VIC AB 2-0 CT2 27 (SUTURE) ×2 IMPLANT
SUTURE STRATFX 0 PDS 27 VIOLET (SUTURE) ×1 IMPLANT
SYR 50ML LL SCALE MARK (SYRINGE) ×1 IMPLANT
TIBIA STEM 5 DEG SZ D R KNEE (Knees) ×1 IMPLANT
TRAY FOLEY MTR SLVR 14FR STAT (SET/KITS/TRAYS/PACK) IMPLANT
TUBE SUCTION HIGH CAP CLEAR NV (SUCTIONS) ×1 IMPLANT
UNDERPAD 30X36 HEAVY ABSORB (UNDERPADS AND DIAPERS) ×1 IMPLANT
WRAP KNEE MAXI GEL POST OP (GAUZE/BANDAGES/DRESSINGS) IMPLANT

## 2023-02-11 NOTE — Op Note (Signed)
 DATE OF SURGERY:  02/11/2023 TIME: 9:05 AM  PATIENT NAME:  Alexandra Hoffman   AGE: 73 y.o.    PRE-OPERATIVE DIAGNOSIS: End-stage right knee osteoarthritis  POST-OPERATIVE DIAGNOSIS:  Same  PROCEDURE: Right total Knee Arthroplasty  SURGEON:  Delaynee Alred A Sheronda Parran, MD   ASSISTANT: Mason Sole, PA-C, present and scrubbed throughout the case, critical for assistance with exposure, retraction, instrumentation, and closure.   OPERATIVE IMPLANTS:  [employer] persona cemented CR size 8 narrow femur, right size D tibial baseplate, 12 mm MC poly insert, 32 mm all poly cemented patella Implant Name Type Inv. Item Serial No. Manufacturer Lot No. LRB No. Used Action  CEMENT BONE R 1X40 - [id number] Cement CEMENT BONE R 1X40  [employer](ORTH,TRAU,BIO,SG) [id number] Right 2 Implanted  COMP FEM CMT CR PERS SZ8 RT - [id number] Joint COMP FEM CMT CR PERS SZ8 RT  [employer](ORTH,TRAU,BIO,SG) [id number] Right 1 Implanted  STEM POLY PAT PLY 1M KNEE - [id number] Knees STEM POLY PAT PLY 1M KNEE  [employer]ON(ORTH,TRAU,BIO,SG) [id number] Right 1 Implanted  TIBIA STEM 5 DEG SZ D R KNEE - [id number] Knees TIBIA STEM 5 DEG SZ D R KNEE  [employer]ON(ORTH,TRAU,BIO,SG) [id number] Right 1 Implanted  INSERT TIB ASF CD/8-9 12 RT - [id number] Insert INSERT TIB ASF CD/8-9 12 RT  [employer](ORTH,TRAU,BIO,SG) [id number] Right 1 Implanted      PREOPERATIVE INDICATIONS:  Alexandra Hoffman is a 73 y.o. year old female with end stage bone on bone degenerative arthritis of the knee who failed conservative treatment, including injections, antiinflammatories, activity modification, and assistive devices, and had significant impairment of their activities of daily living, and elected for Total Knee Arthroplasty.   The risks, benefits, and alternatives were discussed at length including but not limited to the risks of infection, bleeding, nerve injury, stiffness, blood clots, the need for revision surgery, cardiopulmonary  complications, among others, and they were willing to proceed.  ESTIMATED BLOOD LOSS: 25cc  OPERATIVE DESCRIPTION:   Once adequate anesthesia was induced, preoperative antibiotics, 2 gm of ancef ,1 gm of Tranexamic Acid , and 8 mg of Decadron  administered, the patient was positioned supine with a right thigh tourniquet placed.  The right lower extremity was prepped and draped in sterile fashion.  A time-  out was performed identifying the patient, planned procedure, and the appropriate extremity.     The leg was  exsanguinated, tourniquet elevated to 250 mmHg.  A midline incision was  made followed by median parapatellar arthrotomy. Anterior horn of the medial meniscus was released and resected. A medial release was performed, the infrapatellar fat pad was resected with care taken to protect the patellar tendon. The suprapatellar fat was removed to exposed the distal anterior femur. The anterior horn of the lateral meniscus and ACL were released.    Following initial  exposure, I first started with the femur  The femoral  canal was opened with a drill, canal was suctioned to try to prevent fat emboli.  An  intramedullary rod was passed set at 6 degrees valgus, 10 mm. The distal femur was resected.  Following this resection, the tibia was  subluxated anteriorly.  Using the extramedullary guide, 10 mm of bone was resected off   the proximal lateral tibia.  We confirmed the gap would be  stable medially and laterally with a size 10mm spacer block as well as confirmed that the tibial cut was perpendicular in the coronal plane, checking with an alignment rod.    Once this was done, the posterior  femoral referencing femoral sizer was placed under to the posterior condyles with 3 degrees of external rotational which was parallel to the transepicondylar axis and perpendicular to [employer]. The femur was sized to be a size 8 in the anterior-  posterior dimension. The  anterior, posterior, and  chamfer cuts  were made without difficulty nor   notching making certain that I was along the anterior cortex to help  with flexion gap stability. Next a laminar spreader was placed with the knee in flexion and the medial lateral menisci were resected.  5 cc of the Exparel  mixture was injected in the medial side of the back of the knee and 3 cc in the lateral side.  1/2 inch curved osteotome was used to resect posterior osteophyte that was then removed with a pituitary rongeur.       At this point, the tibia was sized to be a size D.  The size D tray was  then pinned in position. Trial reduction was now carried with a 8 femur, D tibia, a 10 mm MC insert.  There was relative medial laxity so we upsized the poly to a size 12 which improve the stability and still had full extension.  The knee had full extension and was stable to varus valgus stress in extension.  The knee was was stable and the PCL was left intact  Attention was next directed to the patella.  Precut  measurement was noted to be 20 mm.  I resected down to 13 mm and used a  32mm patellar button to restore patellar height as well as cover the cut surface.     The patella lug holes were drilled and a 32 mm patella poly trial was placed.    The knee was brought to full extension with good flexion stability with the patella tracking through the trochlea without application of pressure.     Next the femoral component was again assessed and determined to be seated and appropriately lateralized.  The femoral lug holes were drilled.  The femoral component was then removed. Tibial component was again assessed and felt to be seated and appropriately rotated with the medial third of the tubercle. The tibia was then drilled, and keel punched.     Final components were  opened and cement was mixed.      Final implants were then  cemented onto cleaned and dried cut surfaces of bone with the knee brought to extension with a 12 mm MC poly.  The knee was irrigated  with sterile Betadine  diluted in saline as well as pulse lavage normal saline. The synovial lining was  then injected a dilute Exparel  with 30cc of 0.25% marcaine  with epinephrine .        Once the cement had fully cured, excess cement was removed throughout the knee.  I confirmed that I was satisfied with the range of motion and stability, and the final 12mm MC poly insert was chosen.  It was placed into the knee.         The tourniquet had been let down at 58 minutes.  No significant hemostasis was required.  The medial parapatellar arthrotomy was then reapproximated using #1 Stratafix sutures with the knee  in flexion.  The remaining wound was closed with 0 stratafix, 2-0 Vicryl, and running 3-0 Monocryl. The knee was cleaned, dried, dressed sterilely using Dermabond and   Aquacel dressing.  The patient was then brought to recovery room in stable condition, tolerating the procedure  well. There were no complications.   Post op recs: WB: WBAT Abx: ancef  Imaging: PACU xrays DVT prophylaxis: Aspirin  81mg  BID x4 weeks Follow up: 2 weeks after surgery for a wound check with Dr. Pryor Browning at [facility].  Address: [address], [place], [place] [address]  Office Phone: [phone]  Priscille Brought, MD Orthopaedic Surgery

## 2023-02-11 NOTE — Evaluation (Signed)
 Physical Therapy Evaluation Patient Details Name: Alexandra Hoffman MRN: [id number] DOB: 04-19-1950 Today's Date: 02/11/2023  History of Present Illness  Pt is a 73 yo female presenting s/p R TKA on 12/11/2023 due to failure of conservative measures. Pt PMH includes but is not limited to: anemia, GERD, HLD, HTN and L TKA on 01/22/22.  Clinical Impression   Alexandra Hoffman is a 73 y.o. female POD 0 s/p R TKA. Patient reports IND with mobility at baseline. Patient is now limited by functional impairments (see PT problem list below) and requires CGA and cues for transfers and gait with RW. Patient was able to ambulate 45 feet x 2  with RW and CGA and progressing to close S and cues for safe walker management. Patient educated on safe sequencing for functional mobility tasks, fall risk prevention, use of RW, pain management and goal, CP/ice, CPM recommendations and car transfers pt  and daughter verbalized understanding of safe guarding position for people assisting with mobility. Patient instructed in exercises to facilitate ROM and circulation reviewed and HO provided.  Patient will benefit from continued skilled PT interventions to address impairments and progress towards PLOF. Patient has met mobility goals at adequate level for discharge home today with family support and OPPT services scheduled for 2/13; will continue to follow if pt continues acute stay to progress towards Mod I goals.       If plan is discharge home, recommend the following: A little help with walking and/or transfers;A little help with bathing/dressing/bathroom;Assistance with cooking/housework;Assist for transportation;Help with stairs or ramp for entrance   Can travel by private vehicle        Equipment Recommendations None recommended by PT  Recommendations for Other Services       Functional Status Assessment Patient has had a recent decline in their functional status and demonstrates the ability to make significant  improvements in function in a reasonable and predictable amount of time.     Precautions / Restrictions Precautions Precautions: Fall;Knee Restrictions Weight Bearing Restrictions Per Provider Order: No      Mobility  Bed Mobility Overal bed mobility: Needs Assistance Bed Mobility: Supine to Sit     Supine to sit: Supervision, HOB elevated     General bed mobility comments: min cues    Transfers Overall transfer level: Needs assistance Equipment used: Rolling walker (2 wheels) Transfers: Sit to/from Stand Sit to Stand: Contact guard assist           General transfer comment: min cues for proper UE and AD placement with bed, recliner and commode transfers    Ambulation/Gait Ambulation/Gait assistance: Contact guard assist, Supervision Gait Distance (Feet): 45 Feet Assistive device: Rolling walker (2 wheels) Gait Pattern/deviations: Step-to pattern, Antalgic, Trunk flexed Gait velocity: decreased     General Gait Details: slight trunk flexion with B UE support at RW for offloading R LE in stance phase, pt reported soreness but no pain R knee. pt required min cues progressed from CGA to close [employer]     Tilt Bed    Modified Rankin (Stroke Patients Only)       Balance Overall balance assessment: Needs assistance Sitting-balance support: Feet supported Sitting balance-Leahy Scale: Good     Standing balance support: Bilateral upper extremity supported, During functional activity, Reliant on assistive device for balance Standing balance-Leahy Scale: Fair Standing balance comment: static standing no UE support  Pertinent Vitals/Pain Pain Assessment Pain Assessment: 0-10 Pain Score: 0-No pain Pain Location: R knee Pain Descriptors / Indicators: Sore Pain Intervention(s): Limited activity within patient's tolerance, Monitored during session, Premedicated before session,  Repositioned, Ice applied    Home Living Family/patient expects to be discharged to:: Private residence Living Arrangements: Alone Available Help at Discharge: Family Type of Home: House Home Access: Level entry       Home Layout: One level Home Equipment: Agricultural consultant (2 wheels);Cane - single point      Prior Function Prior Level of Function : Independent/Modified Independent;Driving (recently retired Producer, television/film/video)             Mobility Comments: IND no AD with all ADLs, self care tasks and IADLs       Extremity/Trunk Assessment        Lower Extremity Assessment Lower Extremity Assessment: RLE deficits/detail RLE Deficits / Details: ankle DF/PF 5/5; SLR < 10 degree lag RLE Sensation: WNL    Cervical / Trunk Assessment Cervical / Trunk Assessment: Normal  Communication   Communication Communication: No apparent difficulties  Cognition Arousal: Alert Behavior During Therapy: WFL for tasks assessed/performed Overall Cognitive Status: Within Functional Limits for tasks assessed                                          General Comments      Exercises Total Joint Exercises Ankle Circles/Pumps: AROM, Both, 10 reps Quad Sets: AROM, Right, 5 reps Short Arc Quad: AROM, Right, 5 reps Heel Slides: AROM, Right, 5 reps Hip ABduction/ADduction: AROM, Right, 5 reps Straight Leg Raises: AROM, Right, 5 reps Knee Flexion: AROM, Right, 5 reps, Seated   Assessment/Plan    PT Assessment Patient needs continued PT services  PT Problem List Decreased strength;Decreased range of motion;Decreased activity tolerance;Decreased balance;Decreased mobility;Decreased coordination;Pain       PT Treatment Interventions DME instruction;Gait training;Functional mobility training;Therapeutic activities;Therapeutic exercise;Balance training;Neuromuscular re-education;Modalities;Patient/family education    PT Goals (Current goals can be found in the Care Plan  section)  Acute Rehab PT Goals Patient Stated Goal: to [place] the beach PT Goal Formulation: With patient Time For Goal Achievement: 02/25/23 Potential to Achieve Goals: Good    Frequency 7X/week     Co-evaluation               AM-PAC PT "6 Clicks" Mobility  Outcome Measure Help needed turning from your back to your side while in a flat bed without using bedrails?: None Help needed moving from lying on your back to sitting on the side of a flat bed without using bedrails?: None Help needed moving to and from a bed to a chair (including a wheelchair)?: A Little Help needed standing up from a chair using your arms (e.g., wheelchair or bedside chair)?: A Little Help needed to walk in hospital room?: A Little Help needed climbing 3-5 steps with a railing? : A Lot 6 Click Score: 19    End of Session Equipment Utilized During Treatment: Gait belt Activity Tolerance: Patient tolerated treatment well;No increased pain Patient left: in chair;with chair alarm set;with family/visitor present Nurse Communication: Mobility status;Other (comment) (pt readiness for d/c from PT standpoint) PT Visit Diagnosis: Unsteadiness on feet (R26.81);Other abnormalities of gait and mobility (R26.89);Muscle weakness (generalized) (M62.81);Difficulty in walking, not elsewhere classified (R26.2);Pain Pain - Right/Left: Right Pain - part of body: Knee;Leg  Time: 1610-9604 PT Time Calculation (min) (ACUTE ONLY): 39 min   Charges:   PT Evaluation $PT Eval Low Complexity: 1 Low PT Treatments $Gait Training: 8-22 mins $Therapeutic Exercise: 8-22 mins PT General Charges $$ ACUTE PT VISIT: 1 Visit         Cary Clarks, PT Acute Rehab   Annalee Kiang 02/11/2023, 12:34 PM

## 2023-02-11 NOTE — Progress Notes (Signed)
 Orthopedic Tech Progress Note Patient Details:  Alexandra Hoffman 09/01/1950 [id number]  Ortho Devices Type of Ortho Device: Bone foam zero knee Ortho Device/Splint Interventions: Ordered   Post Interventions Patient Tolerated: Well Instructions Provided: Care of device  [place] A Florinda Hush 02/11/2023, 9:59 AM

## 2023-02-11 NOTE — Anesthesia Preprocedure Evaluation (Signed)
 Anesthesia Evaluation  Patient identified by MRN, date of birth, ID band Patient awake    Reviewed: Allergy & Precautions, H&P , NPO status , Patient's Chart, lab work & pertinent test results  Airway Mallampati: II   Neck ROM: full    Dental   Pulmonary neg pulmonary ROS   breath sounds clear to auscultation       Cardiovascular hypertension,  Rhythm:regular Rate:Normal     Neuro/Psych  PSYCHIATRIC DISORDERS Anxiety Depression       GI/Hepatic ,GERD  ,,  Endo/Other    Renal/GU      Musculoskeletal  (+) Arthritis ,    Abdominal   Peds  Hematology   Anesthesia Other Findings   Reproductive/Obstetrics                             Anesthesia Physical Anesthesia Plan  ASA: 2  Anesthesia Plan: Spinal and MAC   Post-op Pain Management: Regional block*   Induction: Intravenous  PONV Risk Score and Plan: 2 and Propofol  infusion and Treatment may vary due to age or medical condition  Airway Management Planned: Simple Face Mask  Additional Equipment:   Intra-op Plan:   Post-operative Plan:   Informed Consent: I have reviewed the patients History and Physical, chart, labs and discussed the procedure including the risks, benefits and alternatives for the proposed anesthesia with the patient or authorized representative who has indicated his/her understanding and acceptance.     Dental advisory given  Plan Discussed with: CRNA, Anesthesiologist and Surgeon  Anesthesia Plan Comments:        Anesthesia Quick Evaluation

## 2023-02-11 NOTE — Anesthesia Procedure Notes (Signed)
 Spinal  Patient location during procedure: OR Start time: 02/11/2023 7:18 AM End time: 02/11/2023 7:30 AM Reason for block: surgical anesthesia Staffing Performed: anesthesiologist  Anesthesiologist: Ellena Gurney, MD Performed by: Ellena Gurney, MD Authorized by: Ellena Gurney, MD   Preanesthetic Checklist Completed: patient identified, IV checked, risks and benefits discussed, surgical consent, monitors and equipment checked, pre-op evaluation and timeout performed Spinal Block Patient position: sitting Prep: DuraPrep Patient monitoring: cardiac monitor, continuous pulse ox and blood pressure Approach: midline Injection technique: single-shot Needle Needle type: Pencan  Needle gauge: 24 G Needle length: 9 cm Additional Notes Functioning IV was confirmed and monitors were applied. Sterile prep and drape, including hand hygiene and sterile gloves were used. The patient was positioned and the spine was prepped. The skin was anesthetized with lidocaine .  Attempted spinal at L3/4 and L2/3 midline with no CSF return.  Paramedian approach at L3/4 attempted without success.  Spinal procedure aborted.  The patient tolerated the procedure well.

## 2023-02-11 NOTE — Interval H&P Note (Signed)

## 2023-02-11 NOTE — Discharge Instructions (Addendum)

## 2023-02-11 NOTE — Transfer of Care (Signed)
 Immediate Anesthesia Transfer of Care Note  Patient: Alexandra Hoffman  Procedure(s) Performed: TOTAL KNEE ARTHROPLASTY (Right: Knee)  Patient Location: PACU  Anesthesia Type:General  Level of Consciousness: drowsy and patient cooperative  Airway & Oxygen Therapy: Patient Spontanous Breathing  Post-op Assessment: Report given to RN  Post vital signs: Reviewed and stable  Last Vitals:  Vitals Value Taken Time  BP 116/62 02/11/23 0947  Temp    Pulse 80 02/11/23 0950  Resp 14 02/11/23 0950  SpO2 88 % 02/11/23 0950  Vitals shown include unfiled device data.  Last Pain:  Vitals:   02/11/23 0550  TempSrc: Oral  PainSc:          Complications: No notable events documented.

## 2023-02-11 NOTE — Anesthesia Procedure Notes (Signed)
 Procedure Name: LMA Insertion Date/Time: 02/11/2023 7:34 AM  Performed by: Raylene Calamity, CRNAPre-anesthesia Checklist: Patient identified, Emergency Drugs available, Suction available and Patient being monitored Patient Re-evaluated:Patient Re-evaluated prior to induction Oxygen Delivery Method: Circle System Utilized Preoxygenation: Pre-oxygenation with 100% oxygen Induction Type: IV induction Ventilation: Mask ventilation without difficulty LMA: LMA inserted LMA Size: 4.0 Number of attempts: 1 Airway Equipment and Method: Bite block Placement Confirmation: positive ETCO2 Tube secured with: Tape Dental Injury: Teeth and Oropharynx as per pre-operative assessment

## 2023-02-11 NOTE — Anesthesia Procedure Notes (Signed)
 Anesthesia Regional Block: Adductor canal block   Pre-Anesthetic Checklist: , timeout performed,  Correct Patient, Correct Site, Correct Laterality,  Correct Procedure, Correct Position, site marked,  Risks and benefits discussed,  Surgical consent,  Pre-op evaluation,  At surgeon's request and post-op pain management  Laterality: Right  Prep: chloraprep       Needles:  Injection technique: Single-shot  Needle Type: Echogenic Needle     Needle Length: 9cm  Needle Gauge: 21     Additional Needles:   Narrative:  Start time: 02/11/2023 6:56 AM End time: 02/11/2023 7:02 AM Injection made incrementally with aspirations every 5 mL.  Performed by: Personally  Anesthesiologist: Ellena Gurney, MD  Additional Notes: Pt tolerated the procedure well.

## 2023-02-12 ENCOUNTER — Encounter (HOSPITAL_COMMUNITY): Payer: Self-pay | Admitting: Orthopedic Surgery

## 2023-02-12 DIAGNOSIS — M1711 Unilateral primary osteoarthritis, right knee: Secondary | ICD-10-CM | POA: Diagnosis not present

## 2023-02-12 DIAGNOSIS — Z96651 Presence of right artificial knee joint: Secondary | ICD-10-CM | POA: Diagnosis not present

## 2023-02-13 DIAGNOSIS — M1711 Unilateral primary osteoarthritis, right knee: Secondary | ICD-10-CM | POA: Diagnosis not present

## 2023-02-14 ENCOUNTER — Encounter (HOSPITAL_COMMUNITY): Payer: Self-pay | Admitting: Orthopedic Surgery

## 2023-02-14 DIAGNOSIS — M1711 Unilateral primary osteoarthritis, right knee: Secondary | ICD-10-CM | POA: Diagnosis not present

## 2023-02-14 DIAGNOSIS — R262 Difficulty in walking, not elsewhere classified: Secondary | ICD-10-CM | POA: Diagnosis not present

## 2023-02-14 DIAGNOSIS — M25661 Stiffness of right knee, not elsewhere classified: Secondary | ICD-10-CM | POA: Diagnosis not present

## 2023-02-14 DIAGNOSIS — M6281 Muscle weakness (generalized): Secondary | ICD-10-CM | POA: Diagnosis not present

## 2023-02-14 NOTE — Anesthesia Postprocedure Evaluation (Signed)
Anesthesia Post Note  Patient: Phylliss Strege Ehler  Procedure(s) Performed: TOTAL KNEE ARTHROPLASTY (Right: Knee)     Patient location during evaluation: PACU Anesthesia Type: General and Regional Level of consciousness: awake and alert Pain management: pain level controlled Vital Signs Assessment: post-procedure vital signs reviewed and stable Respiratory status: spontaneous breathing, nonlabored ventilation, respiratory function stable and patient connected to nasal cannula oxygen Cardiovascular status: blood pressure returned to baseline and stable Postop Assessment: no apparent nausea or vomiting Anesthetic complications: no   No notable events documented.  Last Vitals:  Vitals:   02/11/23 1100 02/11/23 1215  BP: (!) 149/98 (!) 147/93  Pulse:    Resp:    Temp:    SpO2:      Last Pain:  Vitals:   02/11/23 1200  TempSrc:   PainSc: 0-No pain                 Emberley Kral S

## 2023-02-19 DIAGNOSIS — R262 Difficulty in walking, not elsewhere classified: Secondary | ICD-10-CM | POA: Diagnosis not present

## 2023-02-19 DIAGNOSIS — M1711 Unilateral primary osteoarthritis, right knee: Secondary | ICD-10-CM | POA: Diagnosis not present

## 2023-02-19 DIAGNOSIS — M6281 Muscle weakness (generalized): Secondary | ICD-10-CM | POA: Diagnosis not present

## 2023-02-19 DIAGNOSIS — M25661 Stiffness of right knee, not elsewhere classified: Secondary | ICD-10-CM | POA: Diagnosis not present

## 2023-02-20 ENCOUNTER — Encounter (INDEPENDENT_AMBULATORY_CARE_PROVIDER_SITE_OTHER): Payer: 59 | Admitting: Ophthalmology

## 2023-02-26 DIAGNOSIS — M1711 Unilateral primary osteoarthritis, right knee: Secondary | ICD-10-CM | POA: Diagnosis not present

## 2023-02-26 DIAGNOSIS — R262 Difficulty in walking, not elsewhere classified: Secondary | ICD-10-CM | POA: Diagnosis not present

## 2023-02-26 DIAGNOSIS — M25661 Stiffness of right knee, not elsewhere classified: Secondary | ICD-10-CM | POA: Diagnosis not present

## 2023-02-26 DIAGNOSIS — M6281 Muscle weakness (generalized): Secondary | ICD-10-CM | POA: Diagnosis not present

## 2023-02-28 DIAGNOSIS — M1711 Unilateral primary osteoarthritis, right knee: Secondary | ICD-10-CM | POA: Diagnosis not present

## 2023-02-28 DIAGNOSIS — M6281 Muscle weakness (generalized): Secondary | ICD-10-CM | POA: Diagnosis not present

## 2023-02-28 DIAGNOSIS — M25661 Stiffness of right knee, not elsewhere classified: Secondary | ICD-10-CM | POA: Diagnosis not present

## 2023-02-28 DIAGNOSIS — R262 Difficulty in walking, not elsewhere classified: Secondary | ICD-10-CM | POA: Diagnosis not present

## 2023-03-04 ENCOUNTER — Encounter (INDEPENDENT_AMBULATORY_CARE_PROVIDER_SITE_OTHER): Payer: 59 | Admitting: Ophthalmology

## 2023-03-05 DIAGNOSIS — R262 Difficulty in walking, not elsewhere classified: Secondary | ICD-10-CM | POA: Diagnosis not present

## 2023-03-05 DIAGNOSIS — M1711 Unilateral primary osteoarthritis, right knee: Secondary | ICD-10-CM | POA: Diagnosis not present

## 2023-03-05 DIAGNOSIS — M6281 Muscle weakness (generalized): Secondary | ICD-10-CM | POA: Diagnosis not present

## 2023-03-05 DIAGNOSIS — M25661 Stiffness of right knee, not elsewhere classified: Secondary | ICD-10-CM | POA: Diagnosis not present

## 2023-03-05 NOTE — Progress Notes (Signed)
 [facility] - Clinic Note  03/13/2023     CHIEF COMPLAINT Patient presents for Retina Follow Up   HISTORY OF PRESENT ILLNESS: Alexandra Hoffman is a 73 y.o. female who presents to the clinic today for:   HPI     Retina Follow Up   Patient presents with  Other.  In both eyes.  This started 6 months ago.  Duration of 6 months.  I, the attending physician,  performed the HPI with the patient and updated documentation appropriately.        Comments   Patient feels the vision has not changed. She is using AT's. She had a knee replacement 4 weeks ago.       Last edited by Rennis Chris, MD on 03/14/2023  2:26 PM.    Pt states    Referring physician: Daisy Lazar, DO [address]  [place], [place] [address]  HISTORICAL INFORMATION:   Selected notes from the MEDICAL RECORD NUMBER Referred by Dr. Charise Killian LEE: 01/09/2021 Ocular Hx- Cataracts, DES OU, RD OS    CURRENT MEDICATIONS: Current Outpatient Medications (Ophthalmic Drugs)  Medication Sig   carboxymethylcellulose (REFRESH PLUS) 0.5 % SOLN Place 1 drop into both eyes 2 (two) times daily as needed (dry eyes).   No current facility-administered medications for this visit. (Ophthalmic Drugs)   Current Outpatient Medications (Other)  Medication Sig   ALPRAZolam (XANAX) 0.5 MG tablet Take 0.5 mg by mouth 2 (two) times daily as needed for anxiety.   cholecalciferol (VITAMIN D3) 25 MCG (1000 UNIT) tablet Take 1,000 Units by mouth daily.   DULoxetine (CYMBALTA) 30 MG capsule Take 1 capsule (30 mg total) by mouth daily.   gabapentin (NEURONTIN) 100 MG capsule Take 100 mg by mouth 3 (three) times daily.   levocetirizine (XYZAL) 5 MG tablet Take 5 mg by mouth at bedtime.   LINZESS 72 MCG capsule TAKE ONE CAPSULE BY MOUTH ONCE DAILY WITH BREAKFAST. (Patient taking differently: Take 72 mcg by mouth daily as needed (constipation).)   lisinopril (PRINIVIL,ZESTRIL) 10 MG tablet Take 10 mg by mouth in the morning and  at bedtime.   Omega-3 Fatty Acids (FISH OIL) 1200 MG CAPS Take 1,200 mg by mouth in the morning.   pantoprazole (PROTONIX) 40 MG tablet TAKE ONE TABLET BY MOUTH ONCE DAILY BEFORE BREAKFAST.   vitamin B-12 (CYANOCOBALAMIN) 1000 MCG tablet Take 1,000 mcg by mouth in the morning.   polyethylene glycol (MIRALAX) 17 g packet Take 17 g by mouth daily.   No current facility-administered medications for this visit. (Other)   REVIEW OF SYSTEMS: ROS   Positive for: Gastrointestinal, Musculoskeletal, Eyes, Psychiatric Negative for: Constitutional, Neurological, Skin, Genitourinary, HENT, Endocrine, Cardiovascular, Respiratory, Allergic/Imm, Heme/Lymph Last edited by Charlette Caffey, COT on 03/13/2023  9:01 AM.       ALLERGIES No Known Allergies  PAST MEDICAL HISTORY Past Medical History:  Diagnosis Date   Anemia    In childhood   Anxiety    Arthritis    knees   Constipation    Depression    GERD (gastroesophageal reflux disease)    Hyperlipidemia 06/12/2020   Hypertension    Past Surgical History:  Procedure Laterality Date   COLON SURGERY  2006   perforated sigmoid diverticulitis, Dr Lovell Sheehan   COLONOSCOPY  2006   Dr. Jena Gauss: normal rectum and a few sigmoid diverticula, 7 mm angry pedunculated polyp at 30 cm, 4 mm sessile polyp base of cecum. Unknown path    COLONOSCOPY WITH  PROPOFOL N/A 03/04/2017   Procedure: COLONOSCOPY WITH PROPOFOL;  Surgeon: Corbin Ade, MD;  Location: [facility];  Service: Endoscopy;  Laterality: N/A;  7:30am   COLONOSCOPY WITH PROPOFOL N/A 04/07/2020   Procedure: COLONOSCOPY WITH PROPOFOL;  Surgeon: Corbin Ade, MD;  Location: [facility];  Service: Endoscopy;  Laterality: N/A;  AM (wants early as possible due to nausea)   ESOPHAGOGASTRODUODENOSCOPY (EGD) WITH PROPOFOL N/A 03/19/2019   Rourk: Gastric polyps (biopsy showed fundic gland), small hiatal hernia.   POLYPECTOMY  03/04/2017   Procedure: POLYPECTOMY;  Surgeon: Corbin Ade, MD;   Location: [facility];  Service: Endoscopy;;  polyp at cecum and left colon   POLYPECTOMY  03/19/2019   Procedure: POLYPECTOMY;  Surgeon: Corbin Ade, MD;  Location: [facility];  Service: Endoscopy;;  gastric   POLYPECTOMY  04/07/2020   Procedure: POLYPECTOMY;  Surgeon: Corbin Ade, MD;  Location: [facility];  Service: Endoscopy;;   REFRACTIVE SURGERY Left    TOTAL KNEE ARTHROPLASTY Left 01/22/2022   Procedure: LEFT TOTAL KNEE ARTHROPLASTY;  Surgeon: Joen Laura, MD;  Location: [facility];  Service: Orthopedics;  Laterality: Left;   TOTAL KNEE ARTHROPLASTY Right 02/11/2023   Procedure: TOTAL KNEE ARTHROPLASTY;  Surgeon: Joen Laura, MD;  Location: [facility];  Service: Orthopedics;  Laterality: Right;   FAMILY HISTORY Family History  Problem Relation Age of Onset   Arthritis Mother    Depression Mother    Diabetes Mother    Heart disease Mother    Hypertension Mother    Stroke Mother    Kidney disease Mother 4       dialysis   Alcohol abuse Father    Arthritis Father    Cancer Father 50       brain   Hypertension Father    Hypertension Sister    Arthritis Sister    Hypertension Brother    Arthritis Brother    Hypertension Brother    Arthritis Brother    Hypertension Brother    Hypertension Brother    Hypertension Brother    Hypertension Brother    Other Brother        MVA   Colon cancer Neg Hx    SOCIAL HISTORY Social History   Tobacco Use   Smoking status: Never   Smokeless tobacco: Never  Vaping Use   Vaping status: Never Used  Substance Use Topics   Alcohol use: No   Drug use: No       OPHTHALMIC EXAM:  Base Eye Exam     Visual Acuity (Snellen - Linear)       Right Left   Dist cc 20/20 +1 20/20    Correction: Glasses         Tonometry (Tonopen, 9:04 AM)       Right Left   Pressure 16 13         Pupils       Dark Light Shape React APD   Right 3 2 Round Brisk None   Left 3 2 Round Brisk None          Visual Fields       Left Right    Full Full         Extraocular Movement       Right Left    Full, Ortho Full, Ortho         Neuro/Psych     Oriented x3: Yes   Mood/Affect: Normal  Dilation     Both eyes: 1.0% Mydriacyl, 2.5% Phenylephrine @ 9:02 AM           Slit Lamp and Fundus Exam     Slit Lamp Exam       Right Left   Lids/Lashes Dermatochalasis - upper lid, Meibomian gland dysfunction Dermatochalasis - upper lid   Conjunctiva/Sclera nasal and temporal pinguecula small temporal pinguecula   Cornea arcus, trace Punctate epithelial erosions, tear film debris arcus, trace Punctate epithelial erosions, mild tear film debris   Anterior Chamber deep, clear, narrow angles deep, clear, narrow angles   Iris Round and reactive Round and dilated   Lens 2+ Nuclear sclerosis, 2-3+ Cortical cataract 2+ Nuclear sclerosis, 2-3+ Cortical cataract   Anterior Vitreous Mild syneresis, PVD, Janann August Ring, no pigment Mild Vitreous syneresis, PVD, Weiss ring, vitreous condensations - improved         Fundus Exam       Right Left   Disc Pink and Sharp Pink and Sharp, mild tilt   C/D Ratio 0.5 0.3   Macula Flat, good foveal reflex, RPE mottling, No heme or edema Flat, good foveal reflex, mild RPE mottling, No heme or edema   Vessels mild tortuosity attenuated, Tortuous, mild copper wiring   Periphery focal retinal tear at 1100 with SRF -- focal RD -- good laser changes from 0900-1130, focal shallow schisis cavity at 0730, no outer retinal hole, mild reticular degeneration, pigmented cystoid degeneration, no new RT/RD or lattice Attached, bullous schisis cavity from 0100-0200 periphery, shallow schisis cavity IT periphery w/ peripheral outer retinal hole -- good laser changes surrounding, reticular degeneration, no new RT/RD or lattice           Refraction     Wearing Rx       Sphere Cylinder Axis Add   Right +1.25 +0.25 060 +2.25   Left +1.25 Sphere  +2.25            IMAGING AND PROCEDURES  Imaging and Procedures for 03/13/2023  OCT, Retina - OU - Both Eyes       Right Eye Quality was good. Central Foveal Thickness: 259. Progression has been stable. Findings include normal foveal contour, no IRF, no SRF (Stable improvement in vit opacities; Shallow SRF ST periphery caught on widefield - not imaged today).   Left Eye Quality was good. Central Foveal Thickness: 264. Progression has been stable. Findings include normal foveal contour, no IRF, no SRF, subretinal fluid, vitreomacular adhesion (vitreous opacities - improved, bullous retinoschisis ST and IT periphery caught on widefield ).   Notes *Images captured and stored on drive  Diagnosis / Impression:  OD: Stable improvement in vit opacities; Shallow SRF ST periphery caught on widefield - not imaged today OS: vitreous opacities - improved, bullous retinoschisis ST and IT periphery caught on widefield   Clinical management:  See below  Abbreviations: NFP - Normal foveal profile. CME - cystoid macular edema. PED - pigment epithelial detachment. IRF - intraretinal fluid. SRF - subretinal fluid. EZ - ellipsoid zone. ERM - epiretinal membrane. ORA - outer retinal atrophy. ORT - outer retinal tubulation. SRHM - subretinal hyper-reflective material. IRHM - intraretinal hyper-reflective material             ASSESSMENT/PLAN:    ICD-10-CM   1. Bilateral retinoschisis  H33.103 OCT, Retina - OU - Both Eyes    2. Retinal hole of both eyes  H33.323     3. Right retinal detachment  H33.21     4.  Posterior vitreous detachment of both eyes  H43.813 OCT, Retina - OU - Both Eyes    5. Essential hypertension  I10     6. Hypertensive retinopathy of both eyes  H35.033     7. Combined forms of age-related cataract of both eyes  H25.813      1. Retinoschisis OU  - OD w/ shallow schisis cavity IT periphery - OS w/ bullous retinoschisis ST periphery (0100-0200) and shallow schisis IT periphery w/  peripheral outer retinal hole  - asymptomatic, BCVA 20/20 OU  - OS lesion found during routine exam by Dr. Charise Killian - s/p laser retinopexy OS (01.13.23) to patches of schisis -- good laser changes in place surrounding schisis  - no treatment recommended for shallow schisis OD at this time -- monitor  - f/u in 9 mos, sooner prn -- DFE/OCT  2,3. Retinal hole w/ focal retinal detachment OD - new retinal hole at 11 w/ mild heme and surrounding shallow SRF / RD from 09-1128 noted 4.7.23  - asymptomatic  - s/p laser retinopexy OD 04.07.23 -- good laser changes surrounding - no new RT/RD OD - monitor  4. PVD / vitreous syneresis OU  - acute symptomatic FOL and floater OD--onset 01/06/22  - acute symptomatic flashes / floaters OS -- onset 06.04.23  - Discussed findings and prognosis  - No RT or RD on 360 scleral depressed exam OU  - Reviewed s/s of RT/RD  - Strict return precautions for any such RT/RD signs/symptoms  - monitor  5,6. Hypertensive retinopathy OU - discussed importance of tight BP control - monitor  7. Mixed Cataract OU - The symptoms of cataract, surgical options, and treatments and risks were discussed with patient. - discussed diagnosis and progression - monitor  Ophthalmic Meds Ordered this visit:  No orders of the defined types were placed in this encounter.    Return in about 9 months (around 12/13/2023) for f/u retinoschisis OU, DFE, OCT.  There are no Patient Instructions on file for this visit.   Explained the diagnoses, plan, and follow up with the patient and they expressed understanding.  Patient expressed understanding of the importance of proper follow up care.   This document serves as a record of services personally performed by Karie Chimera, MD, PhD. It was created on their behalf by Glee Arvin. Manson Passey, OA an ophthalmic technician. The creation of this record is the provider's dictation and/or activities during the visit.    Electronically signed by:  Glee Arvin. Manson Passey, OA 03/14/23 2:26 PM  Karie Chimera, M.D., Ph.D. Diseases & Surgery of the Retina and Vitreous [facility]  I have reviewed the above documentation for accuracy and completeness, and I agree with the above. Karie Chimera, M.D., Ph.D. 03/14/23 2:27 PM   Abbreviations: M myopia (nearsighted); A astigmatism; H hyperopia (farsighted); P presbyopia; Mrx spectacle prescription;  CTL contact lenses; OD right eye; OS left eye; OU both eyes  XT exotropia; ET esotropia; PEK punctate epithelial keratitis; PEE punctate epithelial erosions; DES dry eye syndrome; MGD meibomian gland dysfunction; ATs artificial tears; PFAT's preservative free artificial tears; NSC nuclear sclerotic cataract; PSC posterior subcapsular cataract; ERM epi-retinal membrane; PVD posterior vitreous detachment; RD retinal detachment; DM diabetes mellitus; DR diabetic retinopathy; NPDR non-proliferative diabetic retinopathy; PDR proliferative diabetic retinopathy; CSME clinically significant macular edema; DME diabetic macular edema; dbh dot blot hemorrhages; CWS cotton wool spot; POAG primary open angle glaucoma; C/D cup-to-disc ratio; HVF humphrey visual field; GVF goldmann visual  field; OCT optical coherence tomography; IOP intraocular pressure; BRVO Branch retinal vein occlusion; CRVO central retinal vein occlusion; CRAO central retinal artery occlusion; BRAO branch retinal artery occlusion; RT retinal tear; SB scleral buckle; PPV pars plana vitrectomy; VH Vitreous hemorrhage; PRP panretinal laser photocoagulation; IVK intravitreal kenalog; VMT vitreomacular traction; MH Macular hole;  NVD neovascularization of the disc; NVE neovascularization elsewhere; AREDS age related eye disease study; ARMD age related macular degeneration; POAG primary open angle glaucoma; EBMD epithelial/anterior basement membrane dystrophy; ACIOL anterior chamber intraocular lens; IOL intraocular lens; PCIOL posterior chamber  intraocular lens; Phaco/IOL phacoemulsification with intraocular lens placement; PRK photorefractive keratectomy; LASIK laser assisted in situ keratomileusis; HTN hypertension; DM diabetes mellitus; COPD chronic obstructive pulmonary disease

## 2023-03-07 DIAGNOSIS — M1711 Unilateral primary osteoarthritis, right knee: Secondary | ICD-10-CM | POA: Diagnosis not present

## 2023-03-07 DIAGNOSIS — M6281 Muscle weakness (generalized): Secondary | ICD-10-CM | POA: Diagnosis not present

## 2023-03-07 DIAGNOSIS — M25661 Stiffness of right knee, not elsewhere classified: Secondary | ICD-10-CM | POA: Diagnosis not present

## 2023-03-07 DIAGNOSIS — R262 Difficulty in walking, not elsewhere classified: Secondary | ICD-10-CM | POA: Diagnosis not present

## 2023-03-12 DIAGNOSIS — M25661 Stiffness of right knee, not elsewhere classified: Secondary | ICD-10-CM | POA: Diagnosis not present

## 2023-03-12 DIAGNOSIS — M1711 Unilateral primary osteoarthritis, right knee: Secondary | ICD-10-CM | POA: Diagnosis not present

## 2023-03-12 DIAGNOSIS — R262 Difficulty in walking, not elsewhere classified: Secondary | ICD-10-CM | POA: Diagnosis not present

## 2023-03-12 DIAGNOSIS — M6281 Muscle weakness (generalized): Secondary | ICD-10-CM | POA: Diagnosis not present

## 2023-03-13 ENCOUNTER — Encounter (INDEPENDENT_AMBULATORY_CARE_PROVIDER_SITE_OTHER): Payer: Self-pay | Admitting: Ophthalmology

## 2023-03-13 ENCOUNTER — Ambulatory Visit (INDEPENDENT_AMBULATORY_CARE_PROVIDER_SITE_OTHER): Payer: 59 | Admitting: Ophthalmology

## 2023-03-13 DIAGNOSIS — H33323 Round hole, bilateral: Secondary | ICD-10-CM | POA: Diagnosis not present

## 2023-03-13 DIAGNOSIS — H43813 Vitreous degeneration, bilateral: Secondary | ICD-10-CM

## 2023-03-13 DIAGNOSIS — H35033 Hypertensive retinopathy, bilateral: Secondary | ICD-10-CM | POA: Diagnosis not present

## 2023-03-13 DIAGNOSIS — H25813 Combined forms of age-related cataract, bilateral: Secondary | ICD-10-CM | POA: Diagnosis not present

## 2023-03-13 DIAGNOSIS — H3321 Serous retinal detachment, right eye: Secondary | ICD-10-CM | POA: Diagnosis not present

## 2023-03-13 DIAGNOSIS — H33103 Unspecified retinoschisis, bilateral: Secondary | ICD-10-CM | POA: Diagnosis not present

## 2023-03-13 DIAGNOSIS — I10 Essential (primary) hypertension: Secondary | ICD-10-CM | POA: Diagnosis not present

## 2023-03-14 ENCOUNTER — Encounter (INDEPENDENT_AMBULATORY_CARE_PROVIDER_SITE_OTHER): Payer: Self-pay | Admitting: Ophthalmology

## 2023-03-14 DIAGNOSIS — M25661 Stiffness of right knee, not elsewhere classified: Secondary | ICD-10-CM | POA: Diagnosis not present

## 2023-03-14 DIAGNOSIS — M1711 Unilateral primary osteoarthritis, right knee: Secondary | ICD-10-CM | POA: Diagnosis not present

## 2023-03-14 DIAGNOSIS — M6281 Muscle weakness (generalized): Secondary | ICD-10-CM | POA: Diagnosis not present

## 2023-03-14 DIAGNOSIS — R262 Difficulty in walking, not elsewhere classified: Secondary | ICD-10-CM | POA: Diagnosis not present

## 2023-03-19 DIAGNOSIS — M6281 Muscle weakness (generalized): Secondary | ICD-10-CM | POA: Diagnosis not present

## 2023-03-19 DIAGNOSIS — M1711 Unilateral primary osteoarthritis, right knee: Secondary | ICD-10-CM | POA: Diagnosis not present

## 2023-03-19 DIAGNOSIS — M25661 Stiffness of right knee, not elsewhere classified: Secondary | ICD-10-CM | POA: Diagnosis not present

## 2023-03-19 DIAGNOSIS — R262 Difficulty in walking, not elsewhere classified: Secondary | ICD-10-CM | POA: Diagnosis not present

## 2023-03-21 ENCOUNTER — Other Ambulatory Visit (HOSPITAL_COMMUNITY): Payer: Self-pay | Admitting: Nurse Practitioner

## 2023-03-21 DIAGNOSIS — R590 Localized enlarged lymph nodes: Secondary | ICD-10-CM | POA: Diagnosis not present

## 2023-03-21 DIAGNOSIS — H9201 Otalgia, right ear: Secondary | ICD-10-CM | POA: Diagnosis not present

## 2023-03-21 DIAGNOSIS — H9209 Otalgia, unspecified ear: Secondary | ICD-10-CM | POA: Diagnosis not present

## 2023-03-22 ENCOUNTER — Other Ambulatory Visit (HOSPITAL_COMMUNITY): Payer: Self-pay | Admitting: Nephrology

## 2023-03-22 DIAGNOSIS — E559 Vitamin D deficiency, unspecified: Secondary | ICD-10-CM | POA: Diagnosis not present

## 2023-03-22 DIAGNOSIS — I129 Hypertensive chronic kidney disease with stage 1 through stage 4 chronic kidney disease, or unspecified chronic kidney disease: Secondary | ICD-10-CM

## 2023-03-22 DIAGNOSIS — N1831 Chronic kidney disease, stage 3a: Secondary | ICD-10-CM

## 2023-03-22 DIAGNOSIS — E785 Hyperlipidemia, unspecified: Secondary | ICD-10-CM | POA: Diagnosis not present

## 2023-03-26 DIAGNOSIS — M1711 Unilateral primary osteoarthritis, right knee: Secondary | ICD-10-CM | POA: Diagnosis not present

## 2023-03-26 DIAGNOSIS — M25661 Stiffness of right knee, not elsewhere classified: Secondary | ICD-10-CM | POA: Diagnosis not present

## 2023-03-26 DIAGNOSIS — M6281 Muscle weakness (generalized): Secondary | ICD-10-CM | POA: Diagnosis not present

## 2023-03-26 DIAGNOSIS — R262 Difficulty in walking, not elsewhere classified: Secondary | ICD-10-CM | POA: Diagnosis not present

## 2023-03-28 ENCOUNTER — Encounter (HOSPITAL_COMMUNITY): Payer: Self-pay

## 2023-03-28 ENCOUNTER — Ambulatory Visit (HOSPITAL_COMMUNITY)

## 2023-03-28 ENCOUNTER — Other Ambulatory Visit: Payer: Self-pay | Admitting: Gastroenterology

## 2023-03-28 DIAGNOSIS — M25661 Stiffness of right knee, not elsewhere classified: Secondary | ICD-10-CM | POA: Diagnosis not present

## 2023-03-28 DIAGNOSIS — M6281 Muscle weakness (generalized): Secondary | ICD-10-CM | POA: Diagnosis not present

## 2023-03-28 DIAGNOSIS — M1711 Unilateral primary osteoarthritis, right knee: Secondary | ICD-10-CM | POA: Diagnosis not present

## 2023-03-28 DIAGNOSIS — R262 Difficulty in walking, not elsewhere classified: Secondary | ICD-10-CM | POA: Diagnosis not present

## 2023-04-01 ENCOUNTER — Ambulatory Visit (HOSPITAL_COMMUNITY)
Admission: RE | Admit: 2023-04-01 | Discharge: 2023-04-01 | Disposition: A | Discharge status: Home / Self Care | Source: Ambulatory Visit | Attending: Nephrology | Admitting: Nephrology

## 2023-04-01 DIAGNOSIS — N1831 Chronic kidney disease, stage 3a: Secondary | ICD-10-CM | POA: Insufficient documentation

## 2023-04-01 DIAGNOSIS — I129 Hypertensive chronic kidney disease with stage 1 through stage 4 chronic kidney disease, or unspecified chronic kidney disease: Secondary | ICD-10-CM | POA: Diagnosis not present

## 2023-04-01 DIAGNOSIS — N183 Chronic kidney disease, stage 3 unspecified: Secondary | ICD-10-CM | POA: Diagnosis not present

## 2023-04-23 ENCOUNTER — Encounter: Payer: Self-pay | Admitting: Gastroenterology

## 2023-04-23 ENCOUNTER — Ambulatory Visit (INDEPENDENT_AMBULATORY_CARE_PROVIDER_SITE_OTHER): Admitting: Gastroenterology

## 2023-04-23 VITALS — BP 146/85 | HR 80 | Temp 98.8°F | Ht 66.0 in | Wt 208.8 lb

## 2023-04-23 DIAGNOSIS — R194 Change in bowel habit: Secondary | ICD-10-CM

## 2023-04-23 DIAGNOSIS — A09 Infectious gastroenteritis and colitis, unspecified: Secondary | ICD-10-CM

## 2023-04-23 NOTE — Patient Instructions (Signed)
 Complete stool test as soon as possible. We will be in touch with results as available. If no infectious, then we can pursue colonoscopy.  Continue to hold Linzess  for now.

## 2023-04-23 NOTE — Progress Notes (Signed)
 Alexandra Hoffman

## 2023-04-23 NOTE — Progress Notes (Signed)
 GI Office Note    Referring Provider: Omie Bickers, MD Primary Care Physician:  Omie Bickers, MD  Primary Gastroenterologist: Rheba Cedar, MD   Chief Complaint   Chief Complaint  Patient presents with   Follow-up    Pt is no longer taking the linzess  due to bowels changing. States that her stools are loose after she eats.     History of Present Illness   Alexandra Hoffman is a 73 y.o. female presenting today for follow up.  Last seen July 2024.History of GERD/dyspepsia/constipation. EGD March 2021 showed gastric polyps (fundic gland), small hiatal hernia. Last colonoscopy 2022 as outlined below.   Today: patient presents with change in stools over the past 2-3 weeks. Postprandially loose stools. Stopped her Linzess  over 2 weeks ago when symptoms started. BM several per day, watery. No melena, brbpr. Does ok with lighter meals.No nocturanal stools.No abdominal pain. No ugi symptoms. No weight loss. Denies recent antibiotics except possibly around time of her knee replacement in Feb. No ill contacts. No other change in medications. No travel or camping.  Colonoscopy April 2022: -two 7 to 9 mm polyps in the cecum removed -Nonbleeding internal hemorrhoids -Chronic diverticulosis -Status post segmental resection -Tubular adenomas, next exam in 5 years  Medications   Current Outpatient Medications  Medication Sig Dispense Refill   ALPRAZolam (XANAX) 0.5 MG tablet Take 0.5 mg by mouth 2 (two) times daily as needed for anxiety.     carboxymethylcellulose (REFRESH PLUS) 0.5 % SOLN Place 1 drop into both eyes 2 (two) times daily as needed (dry eyes).     cholecalciferol (VITAMIN D3) 25 MCG (1000 UNIT) tablet Take 1,000 Units by mouth daily.     DULoxetine  (CYMBALTA ) 30 MG capsule Take 1 capsule (30 mg total) by mouth daily. 30 capsule 6   gabapentin (NEURONTIN) 100 MG capsule Take 100 mg by mouth 2 (two) times daily.     levocetirizine (XYZAL) 5 MG tablet Take 5 mg by mouth at  bedtime.     lisinopril (PRINIVIL,ZESTRIL) 10 MG tablet Take 10 mg by mouth in the morning and at bedtime.     Omega-3 Fatty Acids (FISH OIL) 1200 MG CAPS Take 1,200 mg by mouth in the morning.     pantoprazole  (PROTONIX ) 40 MG tablet TAKE ONE TABLET BY MOUTH ONCE DAILY BEFORE BREAKFAST. 30 tablet 3   vitamin B-12 (CYANOCOBALAMIN) 1000 MCG tablet Take 1,000 mcg by mouth in the morning.     LINZESS  72 MCG capsule TAKE ONE CAPSULE BY MOUTH ONCE DAILY WITH BREAKFAST. (Patient not taking: Reported on 04/23/2023) 30 capsule 5   No current facility-administered medications for this visit.    Allergies   Allergies as of 04/23/2023   (No Known Allergies)     Past Medical History   Past Medical History:  Diagnosis Date   Anemia    In childhood   Anxiety    Arthritis    knees   Constipation    Depression    GERD (gastroesophageal reflux disease)    Hyperlipidemia 06/12/2020   Hypertension     Past Surgical History   Past Surgical History:  Procedure Laterality Date   COLON SURGERY  2006   perforated sigmoid diverticulitis, Dr Larrie Po   COLONOSCOPY  2006   Dr. Riley Cheadle: normal rectum and a few sigmoid diverticula, 7 mm angry pedunculated polyp at 30 cm, 4 mm sessile polyp base of cecum. Unknown path    COLONOSCOPY WITH PROPOFOL  N/A 03/04/2017  Procedure: COLONOSCOPY WITH PROPOFOL ;  Surgeon: Suzette Espy, MD;  [facility]: [facility];  Service: Endoscopy;  Laterality: N/A;  7:30am   COLONOSCOPY WITH PROPOFOL  N/A 04/07/2020   Procedure: COLONOSCOPY WITH PROPOFOL ;  Surgeon: Suzette Espy, MD;  Location: [facility];  Service: Endoscopy;  Laterality: N/A;  AM (wants early as possible due to nausea)   ESOPHAGOGASTRODUODENOSCOPY (EGD) WITH PROPOFOL  N/A 03/19/2019   Rourk: Gastric polyps (biopsy showed fundic gland), small hiatal hernia.   POLYPECTOMY  03/04/2017   Procedure: POLYPECTOMY;  Surgeon: Suzette Espy, MD;  Location: [facility];  Service: Endoscopy;;  polyp at cecum  and left colon   POLYPECTOMY  03/19/2019   Procedure: POLYPECTOMY;  Surgeon: Suzette Espy, MD;  Location: [facility];  Service: Endoscopy;;  gastric   POLYPECTOMY  04/07/2020   Procedure: POLYPECTOMY;  Surgeon: Suzette Espy, MD;  Location: [facility];  Service: Endoscopy;;   REFRACTIVE SURGERY Left    TOTAL KNEE ARTHROPLASTY Left 01/22/2022   Procedure: LEFT TOTAL KNEE ARTHROPLASTY;  Surgeon: Murleen Arms, MD;  Location: [facility];  Service: Orthopedics;  Laterality: Left;   TOTAL KNEE ARTHROPLASTY Right 02/11/2023   Procedure: TOTAL KNEE ARTHROPLASTY;  Surgeon: Murleen Arms, MD;  Location: [facility];  Service: Orthopedics;  Laterality: Right;    Past Family History   Family History  Problem Relation Age of Onset   Arthritis Mother    Depression Mother    Diabetes Mother    Heart disease Mother    Hypertension Mother    Stroke Mother    Kidney disease Mother 54       dialysis   Alcohol  abuse Father    Arthritis Father    Cancer Father 65       brain   Hypertension Father    Hypertension Sister    Arthritis Sister    Hypertension Brother    Arthritis Brother    Hypertension Brother    Arthritis Brother    Hypertension Brother    Hypertension Brother    Hypertension Brother    Hypertension Brother    Other Brother        MVA   Colon cancer Neg Hx     Past Social History   Social History   Socioeconomic History   Marital status: Widowed    Spouse name: Not on file   Number of children: 2   Years of education: 10   Highest education level: Not on file  Occupational History   Occupation: Producer, television/film/video    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   Social Drivers of Health   Financial Resource Strain: Low Risk  (05/31/2020)   Received from [facility], [facility]   Overall Financial Resource Strain (CARDIA)    Difficulty of Paying Living Expenses: Not hard at all  Food Insecurity: No Food Insecurity (05/31/2020)   Received from [facility], [facility]   Hunger Vital Sign    Worried  About Running Out of Food in the Last Year: Never true    Ran Out of Food in the Last Year: Never true  Transportation Needs: No Transportation Needs (05/31/2020)   Received from [facility], [facility]   [facility] - Transportation    Lack of Transportation (Medical): No    Lack of Transportation (Non-Medical): No  Physical Activity: Insufficiently Active (05/31/2020)   Received from [facility], [facility]   Exercise Vital Sign    Days of Exercise per Week: 4 days    Minutes of Exercise per Session: 20 min  Stress: Stress Concern Present (05/31/2020)   Received from [facility], [facility] - Occupational Stress Questionnaire    Feeling of Stress : To some extent  Social Connections: Unknown (05/06/2021)   Received from [facility], [facility]   Social Network    Social Network: Not on file  Intimate Partner Violence: Unknown (04/06/2021)   Received from [facility], [facility]   HITS    Physically Hurt: Not on file    Insult or Talk Down To: Not on file    Threaten Physical Harm: Not on file    Scream or Curse: Not on file    Review of Systems   General: Negative for anorexia, weight loss, fever, chills, fatigue, weakness. ENT: Negative for hoarseness, difficulty swallowing , nasal congestion. CV: Negative for chest pain, angina, palpitations, dyspnea on exertion, peripheral edema.  Respiratory: Negative for dyspnea at rest, dyspnea on exertion,  cough, sputum, wheezing.  GI: See history of present illness. GU:  Negative for dysuria, hematuria, urinary incontinence, urinary frequency, nocturnal urination.  Endo: Negative for unusual weight change.     Physical Exam   BP (!) 146/85 (BP Location: Right Arm, Patient Position: Sitting, Cuff Size: Large)   Pulse 80   Temp 98.8 F (37.1 C) (Oral)   Ht 5\' 6"  (1.676 m)   Wt 208 lb 12.8 oz (94.7 kg)   LMP 01/02/2004 (Approximate)   SpO2 97%   BMI 33.70 kg/m    General: Well-nourished, well-developed in no acute distress.  Eyes: No icterus. Mouth: Oropharyngeal mucosa moist and pink  Lungs: Clear to auscultation bilaterally.  Heart: Regular rate and rhythm, no murmurs rubs or gallops.  Abdomen: Bowel sounds are normal, nontender, nondistended, no hepatosplenomegaly or masses,  no abdominal bruits or hernia , no rebound or guarding.  Rectal: not performed Extremities: No lower extremity edema. No clubbing or deformities. Neuro: Alert and oriented x 4   Skin: Warm and dry, no jaundice.   Psych: Alert and cooperative, normal mood and affect.  Labs   Lab Results  Component Value Date   NA 141 02/01/2023   CL 107 02/01/2023   K 4.4 02/01/2023   CO2 24 02/01/2023   BUN 22 02/01/2023   CREATININE 1.03 (H) 02/01/2023   GFRNONAA 58 (L) 02/01/2023   CALCIUM 9.5 02/01/2023   ALBUMIN 3.9 02/01/2023   GLUCOSE 94 02/01/2023   Lab Results  Component Value Date   WBC 4.7 02/01/2023   HGB 14.7 02/01/2023   HCT 45.1 02/01/2023   MCV 88.6 02/01/2023   PLT 222 02/01/2023   Lab Results  Component Value Date   ALT 11 02/01/2023   AST 13 (L) 02/01/2023   ALKPHOS 54 02/01/2023   BILITOT 0.7 02/01/2023    Imaging Studies   US  RENAL Result Date: 04/01/2023 CLINICAL DATA:  Stage 3 chronic kidney disease. EXAM: RENAL / URINARY  TRACT ULTRASOUND COMPLETE COMPARISON:  November 07, 2004 CT abdomen and pelvis. FINDINGS: Right Kidney: Renal measurements: 9.54.7 x 4.9 cm = volume: 113.3  mL. Echogenicity within normal limits. No mass or hydronephrosis visualized. Left Kidney: Renal measurements: 11.1 x 4.8 x 4.8 cm = volume: 133.4 mL. Echogenicity within normal limits. No mass or hydronephrosis visualized. Mild fullness of renal pelvis. Bladder: Appears normal for degree of bladder distention. Postvoid volume of 29.2 cc. Other: None. IMPRESSION: Normal renal ultrasound. Electronically Signed   By: Anna Barnes M.D.   On: 04/01/2023 11:47    Assessment/Plan:   Change in bowels: -went from constipation requiring Linzess  to postprandial watery stools. She has been off Linzess  for over two weeks. -consider infectious etiology, will check GI profile, if negative, consider updating colonoscopy   [name], MHS, PA-C [facility]

## 2023-04-25 DIAGNOSIS — A09 Infectious gastroenteritis and colitis, unspecified: Secondary | ICD-10-CM | POA: Diagnosis not present

## 2023-04-25 DIAGNOSIS — Z8619 Personal history of other infectious and parasitic diseases: Secondary | ICD-10-CM | POA: Diagnosis not present

## 2023-04-27 LAB — GI PROFILE, STOOL, PCR

## 2023-04-28 ENCOUNTER — Other Ambulatory Visit: Payer: Self-pay | Admitting: Gastroenterology

## 2023-04-28 MED ORDER — VANCOMYCIN HCL 125 MG PO CAPS
125.0000 mg | ORAL_CAPSULE | Freq: Four times a day (QID) | ORAL | 0 refills | Status: AC
Start: 2023-04-28 — End: 2023-05-08

## 2023-05-01 DIAGNOSIS — I10 Essential (primary) hypertension: Secondary | ICD-10-CM | POA: Diagnosis not present

## 2023-05-09 DIAGNOSIS — M1711 Unilateral primary osteoarthritis, right knee: Secondary | ICD-10-CM | POA: Diagnosis not present

## 2023-05-21 DIAGNOSIS — I10 Essential (primary) hypertension: Secondary | ICD-10-CM | POA: Diagnosis not present

## 2023-05-21 DIAGNOSIS — J019 Acute sinusitis, unspecified: Secondary | ICD-10-CM | POA: Diagnosis not present

## 2023-05-28 ENCOUNTER — Encounter: Payer: Self-pay | Admitting: Gastroenterology

## 2023-05-28 ENCOUNTER — Ambulatory Visit (INDEPENDENT_AMBULATORY_CARE_PROVIDER_SITE_OTHER): Admitting: Gastroenterology

## 2023-05-28 VITALS — BP 137/80 | HR 85 | Temp 98.2°F | Ht 66.0 in | Wt 202.0 lb

## 2023-05-28 DIAGNOSIS — R197 Diarrhea, unspecified: Secondary | ICD-10-CM | POA: Insufficient documentation

## 2023-05-28 DIAGNOSIS — Z8619 Personal history of other infectious and parasitic diseases: Secondary | ICD-10-CM | POA: Insufficient documentation

## 2023-05-28 DIAGNOSIS — A09 Infectious gastroenteritis and colitis, unspecified: Secondary | ICD-10-CM

## 2023-05-28 DIAGNOSIS — R195 Other fecal abnormalities: Secondary | ICD-10-CM | POA: Diagnosis not present

## 2023-05-28 NOTE — Patient Instructions (Addendum)
 Collect stool specimen for [facility], [address], [place].  We will be in touch with results as available.   Try to hold off on Linzess  for now so we can monitor your symptoms better.   Try adding Benefiber 2 teaspoons daily for one week and then increase to twice daily to help with stool consistency.  We may add a probiotic in near future but for now we will just add the fiber.   Keep track of number of stools daily and consistency.

## 2023-05-28 NOTE — Progress Notes (Signed)
 GI Office Note    Referring Provider: Omie Bickers, MD Primary Care Physician:  Omie Bickers, MD  Primary Gastroenterologist: Rheba Cedar, MD   Chief Complaint   Chief Complaint  Patient presents with   Follow-up    States that she has issues with diarrhea 30 mins after she eats greasy foods    History of Present Illness   Alexandra Hoffman is a 73 y.o. female presenting today for follow up. Last seen 04/2023. History of GERD/dyspepsia/constipation. EGD March 2021 showed gastric polyps (fundic gland), small hiatal hernia. Last colonoscopy 2022 as outlined below.   Recent stools studies positive for C.diff. treated with vancomycin  for 10 days. Has been off abx good 2-3 weeks. Stools are not back to her baseline constipation but are more formed. She is having about four stools daily, softer, sometimes small amounts and sticky. Hard to clean up. No melena, brbpr. Sometimes postprandial urgency, afraid to eat at times, especially if not at home. No nocturnal stools. Has abdominal cramping before BM. No N/V. She is really concerned about not waiting too late to consider colonoscopy. She had hard time with perforated diverticulitis requiring surgery in 2006.     Colonoscopy April 2022: -two 7 to 9 mm polyps in the cecum removed -Nonbleeding internal hemorrhoids -Chronic diverticulosis -Status post segmental resection -Tubular adenomas, next exam in 5 years  Medications   Current Outpatient Medications  Medication Sig Dispense Refill   ALPRAZolam (XANAX) 0.5 MG tablet Take 0.5 mg by mouth 2 (two) times daily as needed for anxiety.     amLODipine (NORVASC) 5 MG tablet Take 5 mg by mouth daily.     carboxymethylcellulose (REFRESH PLUS) 0.5 % SOLN Place 1 drop into both eyes 2 (two) times daily as needed (dry eyes).     cholecalciferol (VITAMIN D3) 25 MCG (1000 UNIT) tablet Take 1,000 Units by mouth daily.     DULoxetine  (CYMBALTA ) 30 MG capsule Take 1 capsule (30 mg total) by mouth  daily. 30 capsule 6   gabapentin (NEURONTIN) 100 MG capsule Take 100 mg by mouth 2 (two) times daily.     levocetirizine (XYZAL) 5 MG tablet Take 5 mg by mouth at bedtime.     LINZESS  72 MCG capsule TAKE ONE CAPSULE BY MOUTH ONCE DAILY WITH BREAKFAST. 30 capsule 5   lisinopril (PRINIVIL,ZESTRIL) 10 MG tablet Take 10 mg by mouth in the morning and at bedtime.     Omega-3 Fatty Acids (FISH OIL) 1200 MG CAPS Take 1,200 mg by mouth in the morning.     pantoprazole  (PROTONIX ) 40 MG tablet TAKE ONE TABLET BY MOUTH ONCE DAILY BEFORE BREAKFAST. 30 tablet 3   vitamin B-12 (CYANOCOBALAMIN) 1000 MCG tablet Take 1,000 mcg by mouth in the morning.     No current facility-administered medications for this visit.    Allergies   Allergies as of 05/28/2023   (No Known Allergies)       Review of Systems   General: Negative for anorexia, weight loss, fever, chills, fatigue, weakness. ENT: Negative for hoarseness, difficulty swallowing , nasal congestion. CV: Negative for chest pain, angina, palpitations, dyspnea on exertion, peripheral edema.  Respiratory: Negative for dyspnea at rest, dyspnea on exertion, cough, sputum, wheezing.  GI: See history of present illness. GU:  Negative for dysuria, hematuria, urinary incontinence, urinary frequency, nocturnal urination.  Endo: Negative for unusual weight change.     Physical Exam   BP 137/80 (BP Location: Right Arm, Patient Position: Sitting,  Cuff Size: Large)   Pulse 85   Temp 98.2 F (36.8 C) (Oral)   Ht 5\' 6"  (1.676 m)   Wt 202 lb (91.6 kg)   LMP 01/02/2004 (Approximate)   SpO2 97%   BMI 32.60 kg/m    General: Well-nourished, well-developed in no acute distress.  Eyes: No icterus. Mouth: Oropharyngeal mucosa moist and pink   Abdomen: Bowel sounds are normal, nontender, nondistended, no hepatosplenomegaly or masses,  no abdominal bruits or hernia , no rebound or guarding.  Rectal: not performed  Extremities: No lower extremity edema. No  clubbing or deformities. Neuro: Alert and oriented x 4   Skin: Warm and dry, no jaundice.   Psych: Alert and cooperative, normal mood and affect.  Labs   None available  Imaging Studies   No results found.  Assessment/Plan:   Change in bowels: back in April developed diarrhea, baseline of constipation and does well on Linzess  daily. Started having watery postprandial stools. Cdiff PCR positive. She was treated with 10 day course of Vancomycin . Stools not at baseline. Still with four stools daily although less loose. Significant postprandial urgency. May have some persistent Cdiff vs postinfectious IBS.  -recheck stool for Cdiff G[facility]. We discussed that it could be early to recheck but she prefers not to wait.  -add benefiber  -consider probiotics in near future, don't want too many changes at once -hold off on Linzess  for now, may restart if Cdiff negative as she almost describes somewhat non-productive stools -will not rule out possible updated colonoscopy and/or CT if ongoing symptoms.      [name], MHS, PA-C [facility]

## 2023-05-31 DIAGNOSIS — A09 Infectious gastroenteritis and colitis, unspecified: Secondary | ICD-10-CM | POA: Diagnosis not present

## 2023-05-31 DIAGNOSIS — Z8619 Personal history of other infectious and parasitic diseases: Secondary | ICD-10-CM | POA: Diagnosis not present

## 2023-06-04 ENCOUNTER — Ambulatory Visit: Payer: Self-pay | Admitting: Gastroenterology

## 2023-06-04 LAB — CLOSTRIDIUM DIFFICILE TOXIN B, QUALITATIVE, REAL-TIME PCR: Toxigenic C. Difficile by PCR: DETECTED — AB

## 2023-06-04 LAB — C. DIFFICILE GDH AND TOXIN A/B
GDH ANTIGEN: DETECTED
MICRO NUMBER:: 16521172
SPECIMEN QUALITY:: ADEQUATE
TOXIN A AND B: NOT DETECTED

## 2023-06-06 ENCOUNTER — Other Ambulatory Visit (HOSPITAL_COMMUNITY): Payer: Self-pay

## 2023-06-06 ENCOUNTER — Other Ambulatory Visit: Payer: Self-pay

## 2023-06-06 MED ORDER — DIFICID 200 MG PO TABS
200.0000 mg | ORAL_TABLET | Freq: Two times a day (BID) | ORAL | 0 refills | Status: AC
  Filled 2023-06-06: qty 20, 10d supply, fill #0

## 2023-06-07 ENCOUNTER — Other Ambulatory Visit (HOSPITAL_COMMUNITY): Payer: Self-pay

## 2023-06-17 NOTE — Progress Notes (Signed)
 GI Office Note    Referring Provider: Omie Bickers, MD Primary Care Physician:  Omie Bickers, MD  Primary Gastroenterologist: Rheba Cedar, MD   Chief Complaint   No chief complaint on file.   History of Present Illness   Alexandra Hoffman is a 73 y.o. female presenting today for follow up. Last seen 05/28/23 for follow up of Cdiff. Treated with 10 day course of vancomycin .   At last ov, stools better but not near her baseline constipation. Having four stools daily, very soft.   She completed Cdiff GDH 05/31/23, GDH antigen positive, toxin A and B not detected, confirmation toxigenic Cdiff pcr was positive. She was started on fidaxomicin  200mg  BID for 10 days.      Colonoscopy April 2022: -two 7 to 9 mm polyps in the cecum removed -Nonbleeding internal hemorrhoids -Chronic diverticulosis -Status post segmental resection -Tubular adenomas, next exam in 5 years  Medications   Current Outpatient Medications  Medication Sig Dispense Refill   ALPRAZolam (XANAX) 0.5 MG tablet Take 0.5 mg by mouth 2 (two) times daily as needed for anxiety.     amLODipine (NORVASC) 5 MG tablet Take 5 mg by mouth daily.     carboxymethylcellulose (REFRESH PLUS) 0.5 % SOLN Place 1 drop into both eyes 2 (two) times daily as needed (dry eyes).     cholecalciferol (VITAMIN D3) 25 MCG (1000 UNIT) tablet Take 1,000 Units by mouth daily.     DULoxetine  (CYMBALTA ) 30 MG capsule Take 1 capsule (30 mg total) by mouth daily. 30 capsule 6   fidaxomicin  (DIFICID ) 200 MG TABS tablet Take 1 tablet (200 mg total) by mouth 2 (two) times daily for 10 days. 20 tablet 0   gabapentin (NEURONTIN) 100 MG capsule Take 100 mg by mouth 2 (two) times daily.     levocetirizine (XYZAL) 5 MG tablet Take 5 mg by mouth at bedtime.     LINZESS  72 MCG capsule TAKE ONE CAPSULE BY MOUTH ONCE DAILY WITH BREAKFAST. 30 capsule 5   lisinopril (PRINIVIL,ZESTRIL) 10 MG tablet Take 10 mg by mouth in the morning and at bedtime.      Omega-3 Fatty Acids (FISH OIL) 1200 MG CAPS Take 1,200 mg by mouth in the morning.     pantoprazole  (PROTONIX ) 40 MG tablet TAKE ONE TABLET BY MOUTH ONCE DAILY BEFORE BREAKFAST. 30 tablet 3   vitamin B-12 (CYANOCOBALAMIN) 1000 MCG tablet Take 1,000 mcg by mouth in the morning.     No current facility-administered medications for this visit.    Allergies   Allergies as of 06/18/2023   (No Known Allergies)     Past Medical History   Past Medical History:  Diagnosis Date   Anemia    In childhood   Anxiety    Arthritis    knees   Constipation    Depression    GERD (gastroesophageal reflux disease)    Hyperlipidemia 06/12/2020   Hypertension     Past Surgical History   Past Surgical History:  Procedure Laterality Date   COLON SURGERY  2006   perforated sigmoid diverticulitis, Dr Larrie Po   COLONOSCOPY  2006   Dr. Riley Cheadle: normal rectum and a few sigmoid diverticula, 7 mm angry pedunculated polyp at 30 cm, 4 mm sessile polyp base of cecum. Unknown path    COLONOSCOPY WITH PROPOFOL  N/A 03/04/2017   Procedure: COLONOSCOPY WITH PROPOFOL ;  Surgeon: Suzette Espy, MD;  [facility]: [facility];  Service: Endoscopy;  Laterality: N/A;  7:30am   COLONOSCOPY WITH PROPOFOL  N/A 04/07/2020   Procedure: COLONOSCOPY WITH PROPOFOL ;  Surgeon: Suzette Espy, MD;  Location: [facility];  Service: Endoscopy;  Laterality: N/A;  AM (wants early as possible due to nausea)   ESOPHAGOGASTRODUODENOSCOPY (EGD) WITH PROPOFOL  N/A 03/19/2019   Rourk: Gastric polyps (biopsy showed fundic gland), small hiatal hernia.   POLYPECTOMY  03/04/2017   Procedure: POLYPECTOMY;  Surgeon: Suzette Espy, MD;  Location: [facility];  Service: Endoscopy;;  polyp at cecum and left colon   POLYPECTOMY  03/19/2019   Procedure: POLYPECTOMY;  Surgeon: Suzette Espy, MD;  Location: [facility];  Service: Endoscopy;;  gastric   POLYPECTOMY  04/07/2020   Procedure: POLYPECTOMY;  Surgeon: Suzette Espy, MD;   [facility]: [facility];  Service: Endoscopy;;   REFRACTIVE SURGERY Left    TOTAL KNEE ARTHROPLASTY Left 01/22/2022   Procedure: LEFT TOTAL KNEE ARTHROPLASTY;  Surgeon: Murleen Arms, MD;  Location: [facility];  Service: Orthopedics;  Laterality: Left;   TOTAL KNEE ARTHROPLASTY Right 02/11/2023   Procedure: TOTAL KNEE ARTHROPLASTY;  Surgeon: Murleen Arms, MD;  Location: [facility];  Service: Orthopedics;  Laterality: Right;    Past Family History   Family History  Problem Relation Age of Onset   Arthritis Mother    Depression Mother    Diabetes Mother    Heart disease Mother    Hypertension Mother    Stroke Mother    Kidney disease Mother 51       dialysis   Alcohol  abuse Father    Arthritis Father    Cancer Father 57       brain   Hypertension Father    Hypertension Sister    Arthritis Sister    Hypertension Brother    Arthritis Brother    Hypertension Brother    Arthritis Brother    Hypertension Brother    Hypertension Brother    Hypertension Brother    Hypertension Brother    Other Brother        MVA   Colon cancer Neg Hx     Past Social History   Social History   Socioeconomic History   Marital status: Widowed    Spouse name: Not on file   Number of children: 2   Years of education: 10   Highest education level: Not on file  Occupational History   Occupation: Producer, 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   Social Drivers of Health   Financial Resource Strain: Low Risk  (05/31/2020)   Received from [facility]   Overall  Financial Resource Strain (CARDIA)    Difficulty of Paying Living Expenses: Not hard at all  Food Insecurity: No Food Insecurity (05/31/2020)   Received from [facility]   Hunger Vital Sign    Within the past 12 months, you worried that your food would run out before you got the money to buy more.: Never true    Within  the past 12 months, the food you bought just didn't last and you didn't have money to get more.: Never true  Transportation Needs: No Transportation Needs (05/31/2020)   Received from [facility] - Transportation    Lack of Transportation (Medical): No    Lack of Transportation (Non-Medical): No  Physical Activity: Insufficiently Active (05/31/2020)   Received from [facility]   Exercise Vital Sign    On average, how many days per week do you engage in moderate to strenuous exercise (like a brisk walk)?: 4 days    On average, how many minutes do you engage in exercise at this level?: 20 min  Stress: Stress Concern Present (05/31/2020)   Received from [facility] - Occupational Stress Questionnaire    Feeling of Stress : To some extent  Social Connections: Unknown (05/06/2021)   Received from [facility]   Social Network    Social Network: Not on file  Intimate Partner Violence: Unknown (04/06/2021)   Received from [facility]   HITS    Physically Hurt: Not on file    Insult or Talk Down To: Not on file    Threaten Physical Harm: Not on file    Scream or Curse: Not on file    Review of Systems   General: Negative for anorexia, weight loss, fever, chills, fatigue, weakness. ENT: Negative for hoarseness, difficulty swallowing , nasal congestion. CV: Negative for chest pain, angina, palpitations, dyspnea on exertion, peripheral edema.  Respiratory: Negative for dyspnea at rest, dyspnea on exertion, cough, sputum, wheezing.  GI: See history of present illness. GU:  Negative for dysuria, hematuria, urinary  incontinence, urinary frequency, nocturnal urination.  Endo: Negative for unusual weight change.     Physical Exam   LMP 01/02/2004 (Approximate)    General: Well-nourished, well-developed in no acute distress.  Eyes: No icterus. Mouth: Oropharyngeal mucosa moist and pink   Lungs: Clear to auscultation bilaterally.  Heart: Regular rate and rhythm, no murmurs rubs or gallops.  Abdomen: Bowel sounds are normal, nontender, nondistended, no hepatosplenomegaly or masses,  no abdominal bruits or hernia , no rebound or guarding.  Rectal: not performed Extremities: No lower extremity edema. No clubbing or deformities. Neuro: Alert and oriented x 4   Skin: Warm and dry, no jaundice.   Psych: Alert and cooperative, normal mood and affect.  Labs   *** Imaging Studies   No results found.  Assessment/Plan:    Change in bowels: back in April developed diarrhea, baseline of constipation and does well on Linzess  daily. Started having watery postprandial stools. Cdiff PCR positive. She was treated with 10 day course of Vancomycin . Stools not at baseline. Still with four stools daily although less loose. Significant postprandial urgency. May have some persistent Cdiff vs postinfectious IBS.  -recheck stool for Cdiff GDH. We discussed that it could be early to recheck but she prefers not to wait.  -add benefiber  -consider probiotics in near future, don't want too many changes at once -hold off on Linzess  for now, may restart if Cdiff negative as she almost describes somewhat non-productive stools -will not rule out possible updated colonoscopy and/or CT if ongoing symptoms.           ***   [name], MHS, PA-C [facility]

## 2023-06-18 ENCOUNTER — Ambulatory Visit (INDEPENDENT_AMBULATORY_CARE_PROVIDER_SITE_OTHER): Admitting: Gastroenterology

## 2023-06-18 ENCOUNTER — Encounter: Payer: Self-pay | Admitting: Gastroenterology

## 2023-06-18 VITALS — BP 135/84 | HR 86 | Temp 98.6°F | Ht 66.0 in | Wt 201.8 lb

## 2023-06-18 DIAGNOSIS — Z860100 Personal history of colon polyps, unspecified: Secondary | ICD-10-CM

## 2023-06-18 DIAGNOSIS — R194 Change in bowel habit: Secondary | ICD-10-CM

## 2023-06-18 DIAGNOSIS — K219 Gastro-esophageal reflux disease without esophagitis: Secondary | ICD-10-CM

## 2023-06-18 DIAGNOSIS — Z8619 Personal history of other infectious and parasitic diseases: Secondary | ICD-10-CM | POA: Diagnosis not present

## 2023-06-18 DIAGNOSIS — K5900 Constipation, unspecified: Secondary | ICD-10-CM

## 2023-06-18 NOTE — Patient Instructions (Signed)
 Recommend continue benefiber as per package label up to 2-3 times daily to try and regulate your stools.  You can continue Linzess  72mcg daily as needed. For next 1-2 months, add a good probiotic daily to replenish the normal bacteria in your bowels. [employer] [facility], Align, Restora are great options.  Monitor your bowel functions, if you feel like your stools are more frequent or more loose than usual, we can recheck for C.diff.  Your next colonoscopy is due in 04/2025 for follow up of colon polyps. If your bowels continues to be an issue and don't return to normal, or if you have new symptoms, we can consider doing colonoscopy sooner.  Reach out and give me an update in a few weeks. Otherwise we will see you back in one year.

## 2023-06-25 DIAGNOSIS — N1831 Chronic kidney disease, stage 3a: Secondary | ICD-10-CM | POA: Diagnosis not present

## 2023-06-25 DIAGNOSIS — R778 Other specified abnormalities of plasma proteins: Secondary | ICD-10-CM | POA: Diagnosis not present

## 2023-06-25 DIAGNOSIS — N189 Chronic kidney disease, unspecified: Secondary | ICD-10-CM | POA: Diagnosis not present

## 2023-06-25 DIAGNOSIS — R809 Proteinuria, unspecified: Secondary | ICD-10-CM | POA: Diagnosis not present

## 2023-06-25 DIAGNOSIS — I129 Hypertensive chronic kidney disease with stage 1 through stage 4 chronic kidney disease, or unspecified chronic kidney disease: Secondary | ICD-10-CM | POA: Diagnosis not present

## 2023-07-16 DIAGNOSIS — I10 Essential (primary) hypertension: Secondary | ICD-10-CM | POA: Diagnosis not present

## 2023-07-22 ENCOUNTER — Other Ambulatory Visit (HOSPITAL_COMMUNITY): Payer: Self-pay | Admitting: Nephrology

## 2023-07-22 DIAGNOSIS — R809 Proteinuria, unspecified: Secondary | ICD-10-CM | POA: Diagnosis not present

## 2023-07-22 DIAGNOSIS — N1831 Chronic kidney disease, stage 3a: Secondary | ICD-10-CM

## 2023-07-22 DIAGNOSIS — I129 Hypertensive chronic kidney disease with stage 1 through stage 4 chronic kidney disease, or unspecified chronic kidney disease: Secondary | ICD-10-CM

## 2023-07-22 DIAGNOSIS — N182 Chronic kidney disease, stage 2 (mild): Secondary | ICD-10-CM | POA: Diagnosis not present

## 2023-07-22 DIAGNOSIS — E559 Vitamin D deficiency, unspecified: Secondary | ICD-10-CM | POA: Diagnosis not present

## 2023-07-29 DIAGNOSIS — I10 Essential (primary) hypertension: Secondary | ICD-10-CM | POA: Diagnosis not present

## 2023-07-29 DIAGNOSIS — N189 Chronic kidney disease, unspecified: Secondary | ICD-10-CM | POA: Diagnosis not present

## 2023-07-29 DIAGNOSIS — D631 Anemia in chronic kidney disease: Secondary | ICD-10-CM | POA: Diagnosis not present

## 2023-07-29 DIAGNOSIS — E119 Type 2 diabetes mellitus without complications: Secondary | ICD-10-CM | POA: Diagnosis not present

## 2023-07-29 DIAGNOSIS — R809 Proteinuria, unspecified: Secondary | ICD-10-CM | POA: Diagnosis not present

## 2023-07-30 ENCOUNTER — Encounter: Payer: Self-pay | Admitting: Gastroenterology

## 2023-07-30 ENCOUNTER — Ambulatory Visit (INDEPENDENT_AMBULATORY_CARE_PROVIDER_SITE_OTHER): Admitting: Gastroenterology

## 2023-07-30 ENCOUNTER — Telehealth: Payer: Self-pay | Admitting: *Deleted

## 2023-07-30 VITALS — BP 129/84 | HR 89 | Temp 99.0°F | Ht 66.0 in | Wt 198.4 lb

## 2023-07-30 DIAGNOSIS — Z860100 Personal history of colon polyps, unspecified: Secondary | ICD-10-CM | POA: Diagnosis not present

## 2023-07-30 DIAGNOSIS — R809 Proteinuria, unspecified: Secondary | ICD-10-CM | POA: Diagnosis not present

## 2023-07-30 DIAGNOSIS — R109 Unspecified abdominal pain: Secondary | ICD-10-CM | POA: Diagnosis not present

## 2023-07-30 DIAGNOSIS — R140 Abdominal distension (gaseous): Secondary | ICD-10-CM | POA: Diagnosis not present

## 2023-07-30 DIAGNOSIS — D631 Anemia in chronic kidney disease: Secondary | ICD-10-CM | POA: Diagnosis not present

## 2023-07-30 DIAGNOSIS — N189 Chronic kidney disease, unspecified: Secondary | ICD-10-CM | POA: Diagnosis not present

## 2023-07-30 DIAGNOSIS — K5900 Constipation, unspecified: Secondary | ICD-10-CM | POA: Diagnosis not present

## 2023-07-30 NOTE — Progress Notes (Signed)
 GI Office Note    Referring Provider: [name], MD Primary Care Physician:  [name], MD  Primary Gastroenterologist: Ozell Hollingshead, MD   Chief Complaint   Chief Complaint  Patient presents with   Bloated    Wants to discuss bloating in left side.    History of Present Illness   Alexandra Hoffman is a 73 y.o. female presenting today for abdominal discomfort. Last seen six weeks ago. H/o Cdiff, GERD, dyspepsia, constipation, complicated diverticulitis with abscess in 2006. EGD March 2021 showed gastric polyps (fundic gland), small hiatal hernia. Last colonoscopy 2022 as outlined below.   Diagnosed with Cdiff in 04/2023 (positive Cdif toxinA/B on PCR), completed 10 days of vancomycin . Due to persistent loose stool, she completed Cdiff GDH 05/31/23, GDH antigen positive, toxin A and B not detected, confirmation toxigenic Cdiff pcr was positive. She was started on fidaxomicin  200mg  BID for 10 days.   Today: She feels uncomfortable. Left sided abdominal pain. Sometimes notes LUQ distention. When this happens she cannot eat. Bending over hurts. This last flare has lasted for a week. Sometimes feels better after has a BM. Little relief yesterday after passing small stool. She notes that she typically does not have a BM without taking Linzess . Takes Linzess  72mcg about twice a week then has 1-2 loose stools. If she has not had a stool in few days, her gas is malodorous. She worries about persistent Cdiff. No melena, brbpr. No N/V. Appetite otherwise is good. Notes that her kidney function has declined, seeing Dr. Rachele. PCP rechecking her urine, recently said she had UTI but patient asymptomatic.    Colonoscopy April 2022: -two 7 to 9 mm polyps in the cecum removed -Nonbleeding internal hemorrhoids -Chronic diverticulosis -Status post segmental resection -Tubular adenomas, next exam in 5 years  Wt Readings from Last 3 Encounters:  07/30/23 198 lb 6.4 oz (90 kg)  06/18/23 201 lb  12.8 oz (91.5 kg)  05/28/23 202 lb (91.6 kg)      Medications   Current Outpatient Medications  Medication Sig Dispense Refill   ALPRAZolam (XANAX) 0.5 MG tablet Take 0.5 mg by mouth 2 (two) times daily as needed for anxiety.     amLODipine (NORVASC) 5 MG tablet Take 5 mg by mouth daily.     carboxymethylcellulose (REFRESH PLUS) 0.5 % SOLN Place 1 drop into both eyes 2 (two) times daily as needed (dry eyes).     cholecalciferol (VITAMIN D3) 25 MCG (1000 UNIT) tablet Take 1,000 Units by mouth daily.     DULoxetine  (CYMBALTA ) 30 MG capsule Take 1 capsule (30 mg total) by mouth daily. 30 capsule 6   gabapentin (NEURONTIN) 100 MG capsule Take 100 mg by mouth 2 (two) times daily.     levocetirizine (XYZAL) 5 MG tablet Take 5 mg by mouth at bedtime.     LINZESS  72 MCG capsule TAKE ONE CAPSULE BY MOUTH ONCE DAILY WITH BREAKFAST. 30 capsule 5   lisinopril (PRINIVIL,ZESTRIL) 10 MG tablet Take 10 mg by mouth in the morning and at bedtime.     Omega-3 Fatty Acids (FISH OIL) 1200 MG CAPS Take 1,200 mg by mouth in the morning.     pantoprazole  (PROTONIX ) 40 MG tablet TAKE ONE TABLET BY MOUTH ONCE DAILY BEFORE BREAKFAST. 30 tablet 3   vitamin B-12 (CYANOCOBALAMIN) 1000 MCG tablet Take 1,000 mcg by mouth in the morning.     No current facility-administered medications for this visit.    Allergies  Allergies as of 07/30/2023   (No Known Allergies)      Review of Systems   General: Negative for anorexia, weight loss, fever, chills, fatigue, weakness. ENT: Negative for hoarseness, difficulty swallowing , nasal congestion. CV: Negative for chest pain, angina, palpitations, dyspnea on exertion, peripheral edema.  Respiratory: Negative for dyspnea at rest, dyspnea on exertion, cough, sputum, wheezing.  GI: See history of present illness. GU:  Negative for dysuria, hematuria, urinary incontinence, urinary frequency, nocturnal urination.  Endo: Negative for unusual weight change.     Physical  Exam   BP 129/84 (BP Location: Right Arm, Patient Position: Sitting, Cuff Size: Large)   Pulse 89   Temp 99 F (37.2 C) (Oral)   Ht 5' 6 (1.676 m)   Wt 198 lb 6.4 oz (90 kg)   LMP 01/02/2004 (Approximate)   SpO2 97%   BMI 32.02 kg/m    General: Well-nourished, well-developed in no acute distress.  Eyes: No icterus. Mouth: Oropharyngeal mucosa moist and pink   Abdomen: Bowel sounds are normal, nondistended, no hepatosplenomegaly or masses,  no abdominal bruits or hernia , no rebound or guarding. Vague left sided abdominal tenderness, left upper abdomen. Rectal: not performed Extremities: No lower extremity edema. No clubbing or deformities. Neuro: Alert and oriented x 4   Skin: Warm and dry, no jaundice.   Psych: Alert and cooperative, normal mood and affect.  Labs   Lab Results  Component Value Date   NA 141 02/01/2023   CL 107 02/01/2023   K 4.4 02/01/2023   CO2 24 02/01/2023   BUN 22 02/01/2023   CREATININE 1.03 (H) 02/01/2023   GFRNONAA 58 (L) 02/01/2023   CALCIUM 9.5 02/01/2023   ALBUMIN 3.9 02/01/2023   GLUCOSE 94 02/01/2023   Lab Results  Component Value Date   WBC 4.7 02/01/2023   HGB 14.7 02/01/2023   HCT 45.1 02/01/2023   MCV 88.6 02/01/2023   PLT 222 02/01/2023   Lab Results  Component Value Date   ALT 11 02/01/2023   AST 13 (L) 02/01/2023   ALKPHOS 54 02/01/2023   BILITOT 0.7 02/01/2023    Imaging Studies   No results found.  Assessment/Plan:     Change in bowels/left sided abdominal pain: back in April developed diarrhea, baseline of constipation and does well on Linzess  daily as needed. Started having watery postprandial stools. Cdiff PCR positive. She was treated with 10 day course of Vancomycin . Stools not at baseline at last ov. Repeat Cdiff, positive. Treated with 10 day course of Dificid .    -notes she is back to constipation, only has BM with Linzess , taking only twice per week -benefiber to 2-3 times daily -probiotic daily for 1-2  months -now with left sided abdominal discomfort/distention -recommend CT A/P with contrast given persistent symptoms and her history of complicated diverticulitis in the past -if CT unremarkable, would consider offering her colonoscopy as next step. She is due a colonoscopy in 04/2025 for h/o colon polyps. -recommend limiting high fodmap foods.    Sonny RAMAN. Ezzard, MHS, PA-C [facility]

## 2023-07-30 NOTE — Telephone Encounter (Signed)
 Left detailed VM that CT scheduled for 7/30 to arrive at 9:30am.

## 2023-07-30 NOTE — Patient Instructions (Addendum)
 Please have labs done at [facility] at least 2-3 days before your CT scan. We will check your kidney function. If you have your CT scheduled in the next couple of days, please complete your labs at [facility] lab at least one day before your CT.  CT scan of your abdomen to be completed.   Try increasing Linzess  to every other day if tolerated.

## 2023-07-31 ENCOUNTER — Ambulatory Visit (HOSPITAL_COMMUNITY)
Admission: RE | Admit: 2023-07-31 | Discharge: 2023-07-31 | Discharge status: Home / Self Care | Source: Ambulatory Visit | Attending: Gastroenterology | Admitting: Gastroenterology

## 2023-07-31 ENCOUNTER — Ambulatory Visit: Payer: Self-pay | Admitting: Gastroenterology

## 2023-07-31 ENCOUNTER — Ambulatory Visit (HOSPITAL_COMMUNITY)
Admission: RE | Admit: 2023-07-31 | Discharge: 2023-07-31 | Disposition: A | Discharge status: Home / Self Care | Source: Ambulatory Visit | Attending: Nephrology | Admitting: Nephrology

## 2023-07-31 DIAGNOSIS — N1831 Chronic kidney disease, stage 3a: Secondary | ICD-10-CM | POA: Insufficient documentation

## 2023-07-31 DIAGNOSIS — R109 Unspecified abdominal pain: Secondary | ICD-10-CM | POA: Diagnosis not present

## 2023-07-31 DIAGNOSIS — Z9049 Acquired absence of other specified parts of digestive tract: Secondary | ICD-10-CM | POA: Diagnosis not present

## 2023-07-31 DIAGNOSIS — R140 Abdominal distension (gaseous): Secondary | ICD-10-CM | POA: Diagnosis not present

## 2023-07-31 DIAGNOSIS — I129 Hypertensive chronic kidney disease with stage 1 through stage 4 chronic kidney disease, or unspecified chronic kidney disease: Secondary | ICD-10-CM | POA: Diagnosis not present

## 2023-07-31 DIAGNOSIS — R809 Proteinuria, unspecified: Secondary | ICD-10-CM | POA: Diagnosis not present

## 2023-07-31 DIAGNOSIS — K5780 Diverticulitis of intestine, part unspecified, with perforation and abscess without bleeding: Secondary | ICD-10-CM | POA: Diagnosis not present

## 2023-07-31 DIAGNOSIS — N281 Cyst of kidney, acquired: Secondary | ICD-10-CM | POA: Diagnosis not present

## 2023-07-31 LAB — CBC WITH DIFFERENTIAL/PLATELET
Basophils Absolute: 0.1 x10E3/uL (ref 0.0–0.2)
Basos: 1 %
EOS (ABSOLUTE): 0.1 x10E3/uL (ref 0.0–0.4)
Eos: 2 %
Hematocrit: 45.2 % (ref 34.0–46.6)
Hemoglobin: 14.7 g/dL (ref 11.1–15.9)
Immature Grans (Abs): 0 x10E3/uL (ref 0.0–0.1)
Immature Granulocytes: 0 %
Lymphocytes Absolute: 1.3 x10E3/uL (ref 0.7–3.1)
Lymphs: 33 %
MCH: 28.8 pg (ref 26.6–33.0)
MCHC: 32.5 g/dL (ref 31.5–35.7)
MCV: 89 fL (ref 79–97)
Monocytes Absolute: 0.3 x10E3/uL (ref 0.1–0.9)
Monocytes: 8 %
Neutrophils Absolute: 2.2 x10E3/uL (ref 1.4–7.0)
Neutrophils: 56 %
Platelets: 242 x10E3/uL (ref 150–450)
RBC: 5.1 x10E6/uL (ref 3.77–5.28)
RDW: 14.6 % (ref 11.7–15.4)
WBC: 4 x10E3/uL (ref 3.4–10.8)

## 2023-07-31 LAB — BASIC METABOLIC PANEL WITH GFR
BUN/Creatinine Ratio: 15 (ref 12–28)
BUN: 19 mg/dL (ref 8–27)
CO2: 24 mmol/L (ref 20–29)
Calcium: 9.4 mg/dL (ref 8.7–10.3)
Chloride: 102 mmol/L (ref 96–106)
Creatinine, Ser: 1.26 mg/dL — ABNORMAL HIGH (ref 0.57–1.00)
Glucose: 106 mg/dL — ABNORMAL HIGH (ref 70–99)
Potassium: 4.6 mmol/L (ref 3.5–5.2)
Sodium: 136 mmol/L (ref 134–144)
eGFR: 45 mL/min/1.73 — ABNORMAL LOW (ref >59)

## 2023-07-31 MED ORDER — IOHEXOL 9 MG/ML PO SOLN
500.0000 mL | ORAL | Status: AC
  Administered 2023-07-31: 500 mL via ORAL

## 2023-07-31 MED ORDER — IOHEXOL 9 MG/ML PO SOLN
ORAL | Status: AC
  Filled 2023-07-31: qty 1000

## 2023-07-31 MED ORDER — IOHEXOL 350 MG/ML SOLN
75.0000 mL | Freq: Once | INTRAVENOUS | Status: AC | PRN
  Administered 2023-07-31: 75 mL via INTRAVENOUS

## 2023-07-31 MED ORDER — IOHEXOL 300 MG/ML  SOLN: 100.0000 mL | Freq: Once | INTRAMUSCULAR | Status: DC | PRN

## 2023-07-31 NOTE — Progress Notes (Unsigned)
 [facility] OFFICE PROGRESS NOTE  Alexandra Norleen PEDLAR, MD  ASSESSMENT & PLAN:  Assessment & Plan MGUS (monoclonal gammopathy of unknown significance) Referred by Dr. Rachele for MGUS. Serum protein electrophoresis done on 09/09/2021 was negative for M spike.  Immunofixation shows IgA monoclonal protein with kappa light chain specificity.  Monoclonal bands detected were faint. She denies any B symptoms. Lab work from 07/30/2023 shows a hemoglobin of 14.7, calcium 9.4, creatinine 1.26 which is her baseline and she denies any bone pain. Given she recently had a CBC and CMP, will get MGUS labs only, schedule her for a bone scan and get a 24-hour urine. Return to clinic in 4 weeks to review results and plan moving forward. We discussed MGUS in detail and what it means, monitoring parameters and signs and symptoms of progression.     Orders Placed This Encounter  Procedures   DG Bone Survey Met    Standing Status:   Future    Expected Date:   08/01/2023    Expiration Date:   10/30/2023    Reason for Exam (SYMPTOM  OR DIAGNOSIS REQUIRED):   staging myeloma    Preferred imaging location?:   [facility]   24 hr Ur UPEP/UIFE/Light Chains/TP    Standing Status:   Future    Expected Date:   08/01/2023    Expiration Date:   10/30/2023   Beta 2 microglobulin, serum    Standing Status:   Future    Expected Date:   08/01/2023    Expiration Date:   10/30/2023   Kappa/lambda light chains    Standing Status:   Future    Expected Date:   08/01/2023    Expiration Date:   10/30/2023   Multiple Myeloma Panel (SPEP&IFE w/QIG)    Standing Status:   Future    Expected Date:   08/01/2023    Expiration Date:   10/30/2023   VITAMIN D  25 Hydroxy (Vit-D Deficiency, Fractures)    Standing Status:   Future    Expected Date:   08/01/2023    Expiration Date:   10/30/2023    INTERVAL HISTORY: Patient is referred to hematology/oncology for MGUS by Dr. Rachele.    Most recent labs from  07/30/2023 show a fairly unremarkable CBC, CMP shows creatinine within normal ranges to as high as 1.26.  GFR 45.  Worked up with nephrology showed a UPEP/SPEP with a faint band.  She was referred to hematology for this.  She apparently had serum protein electrophoresis 09/09/21 which was negative for an M spike.  immunfixation showed IgA monoclonal protein with kappa light chain specificity.  Monoclonal bands detected are faint.  She currently denies any B symptoms including unintentional weight loss, appetite changes and or night sweats.  As mentioned below, she continues to have abdominal bloating and pain.  She had a CT scan yesterday and results are not back yet.  She also had a renal scan and does not have those results either.  She denies any new onset bone pain.  Patient believes that her mother had chronic leukemia and polycythemia vera although she is not certain.  Reports her mother had to have blood drawn off intermittently.  She is a non-smoker and nondrinker.  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  She lives at home alone.  She is widowed.  She has 2 daughters and her niece has accompanied her today.  SUMMARY OF HEMATOLOGIC HISTORY: Patient is a 73 year old female  with past medical history significant for hypertension, vitamin D  deficiency, hyperlipidemia, GERD, anxiety, Poly- myalgia rheumatica, allergic rhinitis, CKD stage III and cervical lymphadenopathy.  Most recently she has been seen by gastroenterology for abdominal bloating and left-sided abdominal x 3 months.  She had a CT abdomen yesterday.  She was treated for C. difficile back in April 2025 with a 10-day course of vancomycin .  She was treated again in late May for C. difficile and received 10 days of Fidaxomicin .  She had a colonoscopy back in April 2022 which showed chronic diverticulosis, status post segmental resection, 2 tubular adenomas and nonbleeding internal hemorrhoids.  She is due for a repeat colonoscopy in 5  years.  She is followed closely by Dr. Rachele for CKD stage III.  She had an renal ultrasound yesterday.  Lab Results  Component Value Date   HGB 14.7 07/30/2023    Vitals:   08/01/23 0836  BP: 138/87  Pulse: 87  Resp: 18  Temp: 98.1 F (36.7 C)  SpO2: 97%   Review of Systems  Gastrointestinal:  Positive for constipation.  Psychiatric/Behavioral:  Positive for depression.    Physical Exam Constitutional:      Appearance: Normal appearance.  Cardiovascular:     Rate and Rhythm: Normal rate and regular rhythm.  Pulmonary:     Effort: Pulmonary effort is normal.     Breath sounds: Normal breath sounds.  Abdominal:     General: Bowel sounds are normal.     Palpations: Abdomen is soft.  Musculoskeletal:        General: No swelling. Normal range of motion.  Neurological:     Mental Status: She is alert and oriented to person, place, and time. Mental status is at baseline.     I spent 50 minutes dedicated to the care of this patient (face-to-face and non-face-to-face) on the date of the encounter to include what is described in the assessment and plan.,  Delon Hope, NP 08/01/2023 8:40 AM

## 2023-08-01 ENCOUNTER — Inpatient Hospital Stay

## 2023-08-01 ENCOUNTER — Inpatient Hospital Stay: Attending: Oncology | Admitting: Oncology

## 2023-08-01 VITALS — BP 138/87 | HR 87 | Temp 98.1°F | Resp 18

## 2023-08-01 DIAGNOSIS — I129 Hypertensive chronic kidney disease with stage 1 through stage 4 chronic kidney disease, or unspecified chronic kidney disease: Secondary | ICD-10-CM | POA: Insufficient documentation

## 2023-08-01 DIAGNOSIS — D472 Monoclonal gammopathy: Secondary | ICD-10-CM

## 2023-08-01 DIAGNOSIS — N183 Chronic kidney disease, stage 3 unspecified: Secondary | ICD-10-CM | POA: Diagnosis not present

## 2023-08-01 LAB — VITAMIN D 25 HYDROXY (VIT D DEFICIENCY, FRACTURES): Vit D, 25-Hydroxy: 62.37 ng/mL (ref 30–100)

## 2023-08-01 NOTE — Assessment & Plan Note (Addendum)
 Referred by Dr. Rachele for MGUS. Serum protein electrophoresis done on 09/09/2021 was negative for M spike.  Immunofixation shows IgA monoclonal protein with kappa light chain specificity.  Monoclonal bands detected were faint. She denies any B symptoms. Lab work from 07/30/2023 shows a hemoglobin of 14.7, calcium 9.4, creatinine 1.26 which is her baseline and she denies any bone pain. Given she recently had a CBC and CMP, will get MGUS labs only, schedule her for a bone scan and get a 24-hour urine. Return to clinic in 4 weeks to review results and plan moving forward. We discussed MGUS in detail and what it means, monitoring parameters and signs and symptoms of progression.

## 2023-08-02 ENCOUNTER — Ambulatory Visit (HOSPITAL_COMMUNITY)
Admission: RE | Admit: 2023-08-02 | Discharge: 2023-08-02 | Disposition: A | Discharge status: Home / Self Care | Source: Ambulatory Visit | Attending: Oncology | Admitting: Oncology

## 2023-08-02 DIAGNOSIS — D472 Monoclonal gammopathy: Secondary | ICD-10-CM | POA: Insufficient documentation

## 2023-08-02 DIAGNOSIS — M4316 Spondylolisthesis, lumbar region: Secondary | ICD-10-CM | POA: Diagnosis not present

## 2023-08-02 DIAGNOSIS — M47814 Spondylosis without myelopathy or radiculopathy, thoracic region: Secondary | ICD-10-CM | POA: Diagnosis not present

## 2023-08-02 LAB — KAPPA/LAMBDA LIGHT CHAINS
Kappa free light chain: 17.2 mg/L (ref 3.3–19.4)
Kappa, lambda light chain ratio: 1.52 (ref 0.26–1.65)
Lambda free light chains: 11.3 mg/L (ref 5.7–26.3)

## 2023-08-02 LAB — BETA 2 MICROGLOBULIN, SERUM: Beta-2 Microglobulin: 2.3 mg/L (ref 0.6–2.4)

## 2023-08-04 LAB — MULTIPLE MYELOMA PANEL, SERUM
Albumin SerPl Elph-Mcnc: 4.1 g/dL (ref 2.9–4.4)
Albumin/Glob SerPl: 1.6 (ref 0.7–1.7)
Alpha 1: 0.2 g/dL (ref 0.0–0.4)
Alpha2 Glob SerPl Elph-Mcnc: 0.6 g/dL (ref 0.4–1.0)
B-Globulin SerPl Elph-Mcnc: 0.8 g/dL (ref 0.7–1.3)
Gamma Glob SerPl Elph-Mcnc: 1 g/dL (ref 0.4–1.8)
Globulin, Total: 2.6 g/dL (ref 2.2–3.9)
IgA: 218 mg/dL (ref 64–422)
IgG (Immunoglobin G), Serum: 991 mg/dL (ref 586–1602)
IgM (Immunoglobulin M), Srm: 90 mg/dL (ref 26–217)
M Protein SerPl Elph-Mcnc: 0.3 g/dL — ABNORMAL HIGH
Total Protein ELP: 6.7 g/dL (ref 6.0–8.5)

## 2023-08-08 DIAGNOSIS — N289 Disorder of kidney and ureter, unspecified: Secondary | ICD-10-CM | POA: Diagnosis not present

## 2023-08-08 DIAGNOSIS — M542 Cervicalgia: Secondary | ICD-10-CM | POA: Diagnosis not present

## 2023-08-08 DIAGNOSIS — M79621 Pain in right upper arm: Secondary | ICD-10-CM | POA: Diagnosis not present

## 2023-08-08 DIAGNOSIS — J302 Other seasonal allergic rhinitis: Secondary | ICD-10-CM | POA: Diagnosis not present

## 2023-08-08 DIAGNOSIS — H9201 Otalgia, right ear: Secondary | ICD-10-CM | POA: Diagnosis not present

## 2023-08-08 DIAGNOSIS — I10 Essential (primary) hypertension: Secondary | ICD-10-CM | POA: Diagnosis not present

## 2023-08-08 DIAGNOSIS — K219 Gastro-esophageal reflux disease without esophagitis: Secondary | ICD-10-CM | POA: Diagnosis not present

## 2023-08-08 DIAGNOSIS — K589 Irritable bowel syndrome without diarrhea: Secondary | ICD-10-CM | POA: Diagnosis not present

## 2023-08-08 DIAGNOSIS — L989 Disorder of the skin and subcutaneous tissue, unspecified: Secondary | ICD-10-CM | POA: Diagnosis not present

## 2023-08-08 DIAGNOSIS — R42 Dizziness and giddiness: Secondary | ICD-10-CM | POA: Diagnosis not present

## 2023-08-12 NOTE — Progress Notes (Signed)
 Will send copy to pcp

## 2023-08-13 ENCOUNTER — Other Ambulatory Visit (HOSPITAL_COMMUNITY): Payer: Self-pay | Admitting: Family Medicine

## 2023-08-13 DIAGNOSIS — N949 Unspecified condition associated with female genital organs and menstrual cycle: Secondary | ICD-10-CM

## 2023-08-20 DIAGNOSIS — D472 Monoclonal gammopathy: Secondary | ICD-10-CM | POA: Diagnosis not present

## 2023-08-20 DIAGNOSIS — R809 Proteinuria, unspecified: Secondary | ICD-10-CM | POA: Diagnosis not present

## 2023-08-20 DIAGNOSIS — I129 Hypertensive chronic kidney disease with stage 1 through stage 4 chronic kidney disease, or unspecified chronic kidney disease: Secondary | ICD-10-CM | POA: Diagnosis not present

## 2023-08-20 DIAGNOSIS — N1832 Chronic kidney disease, stage 3b: Secondary | ICD-10-CM | POA: Diagnosis not present

## 2023-08-21 ENCOUNTER — Other Ambulatory Visit (HOSPITAL_COMMUNITY): Payer: Self-pay

## 2023-08-21 DIAGNOSIS — D472 Monoclonal gammopathy: Secondary | ICD-10-CM | POA: Diagnosis not present

## 2023-08-22 ENCOUNTER — Ambulatory Visit (HOSPITAL_COMMUNITY)
Admission: RE | Admit: 2023-08-22 | Discharge: 2023-08-22 | Disposition: A | Discharge status: Home / Self Care | Source: Ambulatory Visit | Attending: Family Medicine | Admitting: Family Medicine

## 2023-08-22 DIAGNOSIS — N949 Unspecified condition associated with female genital organs and menstrual cycle: Secondary | ICD-10-CM | POA: Insufficient documentation

## 2023-08-23 LAB — UPEP/UIFE/LIGHT CHAINS/TP, 24-HR UR
% BETA, Urine: 0 %
ALPHA 1 URINE: 0 %
Albumin, U: 100 %
Alpha 2, Urine: 0 %
Free Kappa Lt Chains,Ur: 3.96 mg/L (ref 1.17–86.46)
Free Kappa/Lambda Ratio: >5.74 (ref 1.83–14.26)
Free Lambda Lt Chains,Ur: <0.69 mg/L (ref 0.27–15.21)
GAMMA GLOBULIN URINE: 0 %
Total Protein, Urine-Ur/day: 416 mg/(24.h) — ABNORMAL HIGH (ref 30–150)
Total Protein, Urine: 13 mg/dL
Total Volume: 3200

## 2023-08-28 DIAGNOSIS — H1132 Conjunctival hemorrhage, left eye: Secondary | ICD-10-CM | POA: Diagnosis not present

## 2023-08-28 DIAGNOSIS — H1032 Unspecified acute conjunctivitis, left eye: Secondary | ICD-10-CM | POA: Diagnosis not present

## 2023-08-29 ENCOUNTER — Inpatient Hospital Stay: Attending: Oncology | Admitting: Oncology

## 2023-08-29 VITALS — BP 167/82 | HR 82 | Temp 98.4°F | Resp 18 | Wt 199.7 lb

## 2023-08-29 DIAGNOSIS — D472 Monoclonal gammopathy: Secondary | ICD-10-CM | POA: Diagnosis not present

## 2023-08-29 NOTE — Progress Notes (Signed)
 [facility] OFFICE PROGRESS NOTE  Alexandra Norleen PEDLAR, MD  ASSESSMENT & PLAN:  Assessment & Plan MGUS (monoclonal gammopathy of unknown significance) Referred by Dr. Rachele for MGUS. Serum protein electrophoresis done on 09/09/2021 was negative for M spike.  Immunofixation shows IgA monoclonal protein with kappa light chain specificity.  Monoclonal bands detected were faint. She denies any B symptoms. Lab work from 07/30/2023 shows a hemoglobin of 14.7, calcium 9.4, creatinine 1.26 which is her baseline and she denies any bone pain. Labs from 08/01/23 show m spike 0.3 IgA monoclonal protein with kappa light chain specificity. Beta microglobulin 2.3, KLLC and KLLC ratio are WNL.  Bone Scan from 08/02/23  showed no definitive lytic or sclerotic lesions are noted.  24-hour urine showed no evidence of monoclonal protein.  Total protein 416 U/day.  Will reach out to Dr. Rachele regarding protein in urine.  She is scheduled to see him in October. Return to clinic in 6 months    Orders Placed This Encounter  Procedures   Kappa/lambda light chains    Standing Status:   Future    Expected Date:   02/29/2024    Expiration Date:   05/29/2024   Multiple Myeloma Panel (SPEP&IFE w/QIG)    Standing Status:   Future    Expected Date:   02/29/2024    Expiration Date:   05/29/2024   VITAMIN D  25 Hydroxy (Vit-D Deficiency, Fractures)    Standing Status:   Future    Expected Date:   02/29/2024    Expiration Date:   05/29/2024   CBC with Differential/Platelet    Standing Status:   Future    Expected Date:   02/29/2024    Expiration Date:   05/29/2024   Comprehensive metabolic panel    Standing Status:   Future    Expected Date:   02/29/2024    Expiration Date:   08/28/2024    INTERVAL HISTORY: Patient returns for MGUS follow-up.  She is here to review most recent results.  Reports since she was last seen, she was found to incidentally have a adnexal cyst on CT scan that was ordered by GI.  She  was referred to GYN who had a pelvic transvaginal which was completed on 08/22/2023.  She is still awaiting results.  She had a bone survey on 08/02/2023 which did not reveal any lytic lesions.  Overall, patient is doing well.  Appetite and energy levels are 100%.  Denies any B symptoms.  She did wake up 1 morning a few weeks ago and noticed the inside of her left eye was bloodshot red.  Denies any vision changes.  Denies any pain.  Reports it is mostly itchy.  We reviewed mgus labs, bone scan.   SUMMARY OF HEMATOLOGIC HISTORY: Oncology History   No history exists.     CBC    Component Value Date/Time   WBC 4.0 07/30/2023 1320   WBC 4.7 02/01/2023 1032   RBC 5.10 07/30/2023 1320   RBC 5.09 02/01/2023 1032   HGB 14.7 07/30/2023 1320   HCT 45.2 07/30/2023 1320   PLT 242 07/30/2023 1320   MCV 89 07/30/2023 1320   MCH 28.8 07/30/2023 1320   MCH 28.9 02/01/2023 1032   MCHC 32.5 07/30/2023 1320   MCHC 32.6 02/01/2023 1032   RDW 14.6 07/30/2023 1320   LYMPHSABS 1.3 07/30/2023 1320   MONOABS 0.4 02/01/2023 1032   EOSABS 0.1 07/30/2023 1320   BASOSABS 0.1 07/30/2023 1320  Latest Ref Rng & Units 07/30/2023    1:20 PM 02/01/2023   10:32 AM 01/09/2022    1:30 PM  CMP  Glucose 70 - 99 mg/dL 893  94  90   BUN 8 - 27 mg/dL 19  22  17    Creatinine 0.57 - 1.00 mg/dL 8.73  8.96  9.01   Sodium 134 - 144 mmol/L 136  141  140   Potassium 3.5 - 5.2 mmol/L 4.6  4.4  3.8   Chloride 96 - 106 mmol/L 102  107  107   CO2 20 - 29 mmol/L 24  24  25    Calcium 8.7 - 10.3 mg/dL 9.4  9.5  9.0   Total Protein 6.5 - 8.1 g/dL  7.0    Total Bilirubin 0.0 - 1.2 mg/dL  0.7    Alkaline Phos 38 - 126 U/L  54    AST 15 - 41 U/L  13    ALT 0 - 44 U/L  11       No results found for: FERRITIN, VITAMINB12  Vitals:   08/29/23 0951  BP: (!) 167/82  Pulse: 82  Resp: 18  Temp: 98.4 F (36.9 C)  SpO2: 96%    Review of System:  Review of Systems  Constitutional:  Negative for malaise/fatigue.   Eyes:  Positive for redness. Negative for photophobia, pain and discharge.  Musculoskeletal:  Negative for falls and joint pain.  Neurological:  Negative for dizziness.  Psychiatric/Behavioral:  The patient is not nervous/anxious.     Physical Exam: Physical Exam Constitutional:      Appearance: Normal appearance.  HENT:     Head: Normocephalic and atraumatic.  Eyes:     Conjunctiva/sclera:     Left eye: Left conjunctiva is injected. Hemorrhage present.     Pupils: Pupils are equal, round, and reactive to light.  Cardiovascular:     Rate and Rhythm: Normal rate and regular rhythm.     Heart sounds: Normal heart sounds. No murmur heard. Pulmonary:     Effort: Pulmonary effort is normal.     Breath sounds: Normal breath sounds. No wheezing.  Abdominal:     General: Bowel sounds are normal. There is no distension.     Palpations: Abdomen is soft.     Tenderness: There is no abdominal tenderness.  Musculoskeletal:        General: Normal range of motion.     Cervical back: Normal range of motion.  Skin:    General: Skin is warm and dry.     Findings: No rash.  Neurological:     Mental Status: She is alert and oriented to person, place, and time.     Gait: Gait is intact.  Psychiatric:        Mood and Affect: Mood and affect normal.        Cognition and Memory: Memory normal.        Judgment: Judgment normal.      I spent 20 minutes dedicated to the care of this patient (face-to-face and non-face-to-face) on the date of the encounter to include what is described in the assessment and plan.,  Delon Hope, NP 08/29/2023 10:30 AM

## 2023-08-29 NOTE — Assessment & Plan Note (Addendum)
 Referred by Dr. Rachele for MGUS. Serum protein electrophoresis done on 09/09/2021 was negative for M spike.  Immunofixation shows IgA monoclonal protein with kappa light chain specificity.  Monoclonal bands detected were faint. She denies any B symptoms. Lab work from 07/30/2023 shows a hemoglobin of 14.7, calcium 9.4, creatinine 1.26 which is her baseline and she denies any bone pain. Labs from 08/01/23 show m spike 0.3 IgA monoclonal protein with kappa light chain specificity. Beta microglobulin 2.3, KLLC and KLLC ratio are WNL.  Bone Scan from 08/02/23  showed no definitive lytic or sclerotic lesions are noted.  24-hour urine showed no evidence of monoclonal protein.  Total protein 416 U/day.  Will reach out to Dr. Rachele regarding protein in urine.  She is scheduled to see him in October. Return to clinic in 6 months

## 2023-09-10 ENCOUNTER — Other Ambulatory Visit: Payer: Self-pay | Admitting: Obstetrics and Gynecology

## 2023-09-10 DIAGNOSIS — N83202 Unspecified ovarian cyst, left side: Secondary | ICD-10-CM

## 2023-09-19 ENCOUNTER — Ambulatory Visit
Admission: RE | Admit: 2023-09-19 | Discharge: 2023-09-19 | Disposition: A | Discharge status: Home / Self Care | Source: Ambulatory Visit | Attending: Obstetrics and Gynecology

## 2023-09-19 DIAGNOSIS — N83202 Unspecified ovarian cyst, left side: Secondary | ICD-10-CM

## 2023-09-19 MED ORDER — GADOPICLENOL 0.5 MMOL/ML IV SOLN
9.0000 mL | Freq: Once | INTRAVENOUS | Status: AC | PRN
  Administered 2023-09-19: 9 mL via INTRAVENOUS

## 2023-09-25 ENCOUNTER — Encounter: Admitting: Obstetrics & Gynecology

## 2023-09-26 ENCOUNTER — Other Ambulatory Visit: Payer: Self-pay | Admitting: Gastroenterology

## 2023-10-01 NOTE — Telephone Encounter (Signed)
 Please offer patient a colonoscopy for unexplained llq pain, history of colon polyps.   ASA 2 Ask patient if she is moving her bowels regularly with Linzess .

## 2023-10-02 DIAGNOSIS — J302 Other seasonal allergic rhinitis: Secondary | ICD-10-CM | POA: Diagnosis not present

## 2023-10-02 DIAGNOSIS — D472 Monoclonal gammopathy: Secondary | ICD-10-CM | POA: Diagnosis not present

## 2023-10-02 NOTE — Telephone Encounter (Signed)
 Pt was made aware and is ok with moving forward with colonoscopy. Pt stated that she is moving her bowels regularly with the Linzess .

## 2023-10-15 ENCOUNTER — Other Ambulatory Visit: Payer: Self-pay | Admitting: *Deleted

## 2023-10-15 MED ORDER — CLENPIQ 10-3.5-12 MG-GM -GM/175ML PO SOLN
1.0000 | ORAL | 0 refills | Status: AC
Start: 2023-10-15 — End: ?

## 2023-10-22 DIAGNOSIS — E211 Secondary hyperparathyroidism, not elsewhere classified: Secondary | ICD-10-CM | POA: Diagnosis not present

## 2023-10-22 DIAGNOSIS — R809 Proteinuria, unspecified: Secondary | ICD-10-CM | POA: Diagnosis not present

## 2023-10-22 DIAGNOSIS — D631 Anemia in chronic kidney disease: Secondary | ICD-10-CM | POA: Diagnosis not present

## 2023-10-22 DIAGNOSIS — E119 Type 2 diabetes mellitus without complications: Secondary | ICD-10-CM | POA: Diagnosis not present

## 2023-10-22 DIAGNOSIS — I10 Essential (primary) hypertension: Secondary | ICD-10-CM | POA: Diagnosis not present

## 2023-10-24 ENCOUNTER — Encounter (HOSPITAL_COMMUNITY): Payer: Self-pay | Admitting: Internal Medicine

## 2023-10-24 ENCOUNTER — Ambulatory Visit (HOSPITAL_COMMUNITY): Admitting: Anesthesiology

## 2023-10-24 ENCOUNTER — Other Ambulatory Visit: Payer: Self-pay

## 2023-10-24 ENCOUNTER — Encounter (HOSPITAL_COMMUNITY)
Admission: RE | Disposition: A | Discharge status: Home / Self Care | Payer: Self-pay | Source: Home / Self Care | Attending: Internal Medicine

## 2023-10-24 ENCOUNTER — Ambulatory Visit (HOSPITAL_COMMUNITY)
Admission: RE | Admit: 2023-10-24 | Discharge: 2023-10-24 | Disposition: A | Discharge status: Home / Self Care | Attending: Internal Medicine | Admitting: Internal Medicine

## 2023-10-24 DIAGNOSIS — Z1211 Encounter for screening for malignant neoplasm of colon: Secondary | ICD-10-CM | POA: Diagnosis not present

## 2023-10-24 DIAGNOSIS — D122 Benign neoplasm of ascending colon: Secondary | ICD-10-CM | POA: Diagnosis not present

## 2023-10-24 DIAGNOSIS — I10 Essential (primary) hypertension: Secondary | ICD-10-CM

## 2023-10-24 DIAGNOSIS — F418 Other specified anxiety disorders: Secondary | ICD-10-CM

## 2023-10-24 DIAGNOSIS — R1032 Left lower quadrant pain: Secondary | ICD-10-CM | POA: Diagnosis not present

## 2023-10-24 DIAGNOSIS — K219 Gastro-esophageal reflux disease without esophagitis: Secondary | ICD-10-CM | POA: Diagnosis not present

## 2023-10-24 DIAGNOSIS — Z980 Intestinal bypass and anastomosis status: Secondary | ICD-10-CM | POA: Diagnosis not present

## 2023-10-24 DIAGNOSIS — Z860101 Personal history of adenomatous and serrated colon polyps: Secondary | ICD-10-CM

## 2023-10-24 DIAGNOSIS — K5730 Diverticulosis of large intestine without perforation or abscess without bleeding: Secondary | ICD-10-CM | POA: Diagnosis not present

## 2023-10-24 DIAGNOSIS — D175 Benign lipomatous neoplasm of intra-abdominal organs: Secondary | ICD-10-CM | POA: Diagnosis not present

## 2023-10-24 HISTORY — PX: COLONOSCOPY: SHX5424

## 2023-10-24 SURGERY — COLONOSCOPY
Anesthesia: General

## 2023-10-24 MED ORDER — PHENYLEPHRINE 80 MCG/ML (10ML) SYRINGE FOR IV PUSH (FOR BLOOD PRESSURE SUPPORT)
PREFILLED_SYRINGE | INTRAVENOUS | Status: DC | PRN
  Administered 2023-10-24: 160 ug via INTRAVENOUS

## 2023-10-24 MED ORDER — LACTATED RINGERS IV SOLN: INTRAVENOUS | Status: DC | PRN

## 2023-10-24 MED ORDER — PROPOFOL 500 MG/50ML IV EMUL
INTRAVENOUS | Status: DC | PRN
  Administered 2023-10-24: 200 ug/kg/min via INTRAVENOUS

## 2023-10-24 MED ORDER — PROPOFOL 10 MG/ML IV BOLUS
INTRAVENOUS | Status: DC | PRN
Start: 2023-10-24 — End: 2023-10-24
  Administered 2023-10-24: 100 mg via INTRAVENOUS

## 2023-10-24 NOTE — Anesthesia Preprocedure Evaluation (Signed)
 Anesthesia Evaluation  Patient identified by MRN, date of birth, ID band Patient awake    Reviewed: Allergy & Precautions, H&P , NPO status , Patient's Chart, lab work & pertinent test results, reviewed documented beta blocker date and time   Airway Mallampati: II  TM Distance: >3 FB Neck ROM: full    Dental no notable dental hx.    Pulmonary neg pulmonary ROS   Pulmonary exam normal breath sounds clear to auscultation       Cardiovascular Exercise Tolerance: Good hypertension,  Rhythm:regular Rate:Normal     Neuro/Psych  PSYCHIATRIC DISORDERS Anxiety Depression    negative neurological ROS     GI/Hepatic Neg liver ROS,GERD  ,,  Endo/Other  negative endocrine ROS    Renal/GU negative Renal ROS  negative genitourinary   Musculoskeletal   Abdominal   Peds  Hematology  (+) Blood dyscrasia, anemia   Anesthesia Other Findings   Reproductive/Obstetrics negative OB ROS                              Anesthesia Physical Anesthesia Plan  ASA: 2  Anesthesia Plan: General   Post-op Pain Management:    Induction:   PONV Risk Score and Plan: Propofol  infusion  Airway Management Planned:   Additional Equipment:   Intra-op Plan:   Post-operative Plan:   Informed Consent: I have reviewed the patients History and Physical, chart, labs and discussed the procedure including the risks, benefits and alternatives for the proposed anesthesia with the patient or authorized representative who has indicated his/her understanding and acceptance.     Dental Advisory Given  Plan Discussed with: CRNA  Anesthesia Plan Comments:         Anesthesia Quick Evaluation

## 2023-10-24 NOTE — Discharge Instructions (Addendum)
  Colonoscopy Discharge Instructions  Read the instructions outlined below and refer to this sheet in the next few weeks. These discharge instructions provide you with general information on caring for yourself after you leave the hospital. Your doctor may also give you specific instructions. While your treatment has been planned according to the most current medical practices available, unavoidable complications occasionally occur. If you have any problems or questions after discharge, call Dr. Shaaron at [phone]. ACTIVITY You may resume your regular activity, but move at a slower pace for the next 24 hours.  Take frequent rest periods for the next 24 hours.  Walking will help get rid of the air and reduce the bloated feeling in your belly (abdomen).  No driving for 24 hours (because of the medicine (anesthesia) used during the test).   Do not sign any important legal documents or operate any machinery for 24 hours (because of the anesthesia used during the test).  NUTRITION Drink plenty of fluids.  You may resume your normal diet as instructed by your doctor.  Begin with a light meal and progress to your normal diet. Heavy or fried foods are harder to digest and may make you feel sick to your stomach (nauseated).  Avoid alcoholic beverages for 24 hours or as instructed.  MEDICATIONS You may resume your normal medications unless your doctor tells you otherwise.  WHAT YOU CAN EXPECT TODAY Some feelings of bloating in the abdomen.  Passage of more gas than usual.  Spotting of blood in your stool or on the toilet paper.  IF YOU HAD POLYPS REMOVED DURING THE COLONOSCOPY: No aspirin  products for 7 days or as instructed.  No alcohol  for 7 days or as instructed.  Eat a soft diet for the next 24 hours.  FINDING OUT THE RESULTS OF YOUR TEST Not all test results are available during your visit. If your test results are not back during the visit, make an appointment with your caregiver to find out the  results. Do not assume everything is normal if you have not heard from your caregiver or the medical facility. It is important for you to follow up on all of your test results.  SEEK IMMEDIATE MEDICAL ATTENTION IF: You have more than a spotting of blood in your stool.  Your belly is swollen (abdominal distention).  You are nauseated or vomiting.  You have a temperature over 101.  You have abdominal pain or discomfort that is severe or gets worse throughout the day.       1 polyp found and removed.  Diverticulosis present  Further recommendations to follow pending review of pathology report

## 2023-10-24 NOTE — H&P (Signed)
 @LOGO @   Gastroenterology Progress Note    Primary Care Physician:  [name], MD Primary Gastroenterologist:  Dr. Shaaron  Pre-Procedure History & Physical: HPI:  Alexandra [facility] is a 73 y.o. female here for   Past Medical History:  Diagnosis Date   Acute sinusitis 01/16/2021   Acute upper respiratory infection 05/08/2022   Anemia    In childhood   Anxiety    Arthritis    knees   Constipation    Depression    Epidermoid cyst of skin of thigh 11/10/2020   GERD (gastroesophageal reflux disease)    Hyperlipidemia 06/12/2020   Hypertension    Osteoarthritis of both knees 02/06/2016   Pain in both upper arms 03/02/2021   Pain in throat 08/29/2021   Sore throat 09/12/2022   Weakness 03/23/2021    Past Surgical History:  Procedure Laterality Date   COLON SURGERY  2006   perforated sigmoid diverticulitis, Dr Mavis   COLONOSCOPY  2006   Dr. Shaaron: normal rectum and a few sigmoid diverticula, 7 mm angry pedunculated polyp at 30 cm, 4 mm sessile polyp base of cecum. Unknown path    COLONOSCOPY WITH PROPOFOL  N/A 03/04/2017   Procedure: COLONOSCOPY WITH PROPOFOL ;  Surgeon: Shaaron Lamar HERO, MD;  Location: [facility];  Service: Endoscopy;  Laterality: N/A;  7:30am   COLONOSCOPY WITH PROPOFOL  N/A 04/07/2020   Procedure: COLONOSCOPY WITH PROPOFOL ;  Surgeon: Shaaron Lamar HERO, MD;  Location: [facility];  Service: Endoscopy;  Laterality: N/A;  AM (wants early as possible due to nausea)   ESOPHAGOGASTRODUODENOSCOPY (EGD) WITH PROPOFOL  N/A 03/19/2019   Dannelle Rhymes: Gastric polyps (biopsy showed fundic gland), small hiatal hernia.   POLYPECTOMY  03/04/2017   Procedure: POLYPECTOMY;  Surgeon: Shaaron Lamar HERO, MD;  Location: [facility];  Service: Endoscopy;;  polyp at cecum and left colon   POLYPECTOMY  03/19/2019   Procedure: POLYPECTOMY;  Surgeon: Shaaron Lamar HERO, MD;  Location: [facility];  Service: Endoscopy;;  gastric   POLYPECTOMY  04/07/2020   Procedure: POLYPECTOMY;  Surgeon:  Shaaron Lamar HERO, MD;  Location: [facility];  Service: Endoscopy;;   REFRACTIVE SURGERY Left    TOTAL KNEE ARTHROPLASTY Left 01/22/2022   Procedure: LEFT TOTAL KNEE ARTHROPLASTY;  Surgeon: Edna Toribio LABOR, MD;  Location: [facility];  Service: Orthopedics;  Laterality: Left;   TOTAL KNEE ARTHROPLASTY Right 02/11/2023   Procedure: TOTAL KNEE ARTHROPLASTY;  Surgeon: Edna Toribio LABOR, MD;  Location: [facility];  Service: Orthopedics;  Laterality: Right;    Prior to Admission medications   Medication Sig Start Date End Date Taking? Authorizing Provider  ALPRAZolam (XANAX) 0.5 MG tablet Take 0.5 mg by mouth 2 (two) times daily as needed for anxiety.   Yes [provider]  amLODipine (NORVASC) 5 MG tablet Take 5 mg by mouth daily. 05/21/23  Yes [provider]  carboxymethylcellulose (REFRESH PLUS) 0.5 % SOLN Place 1 drop into both eyes 2 (two) times daily as needed (dry eyes).   Yes [provider]  cholecalciferol (VITAMIN D3) 25 MCG (1000 UNIT) tablet Take 1,000 Units by mouth daily.   Yes [provider]  DULoxetine  (CYMBALTA ) 30 MG capsule Take 1 capsule (30 mg total) by mouth daily. 03/23/21  Yes Onita Duos, MD  gabapentin (NEURONTIN) 100 MG capsule Take 100 mg by mouth 2 (two) times daily. 03/02/21  Yes [provider]  levocetirizine (XYZAL) 5 MG tablet Take 5 mg by mouth at bedtime. 01/08/20  Yes [provider]  LINZESS  72 MCG capsule TAKE ONE CAPSULE BY MOUTH ONCE DAILY WITH BREAKFAST. 09/26/23  Yes Ezzard Sonny RAMAN, PA-C  lisinopril (PRINIVIL,ZESTRIL) 10 MG tablet Take 10 mg by mouth in the morning and at bedtime.   Yes [provider]  Omega-3 Fatty Acids (FISH OIL) 1200 MG CAPS Take 1,200 mg by mouth in the morning.   Yes [provider]  pantoprazole  (PROTONIX ) 40 MG tablet TAKE ONE TABLET BY MOUTH ONCE DAILY BEFORE BREAKFAST. 09/06/20  Yes Shirlean Therisa ORN, NP  Sod Picosulfate-Mag Ox-Cit Acd (CLENPIQ ) 10-3.5-12 MG-GM -GM/175ML  SOLN Take 1 kit by mouth as directed. 10/15/23  Yes [name], Lamar HERO, MD  vitamin B-12 (CYANOCOBALAMIN ) 1000 MCG tablet Take 1,000 mcg by mouth in the morning.   Yes [provider]  ofloxacin (OCUFLOX) 0.3 % ophthalmic solution  08/28/23   [provider]    Allergies as of 10/03/2023   (No Known Allergies)    Family History  Problem Relation Age of Onset   Arthritis Mother    Depression Mother    Diabetes Mother    Heart disease Mother    Hypertension Mother    Stroke Mother    Kidney disease Mother 27       dialysis   Alcohol  abuse Father    Arthritis Father    Cancer Father 10       brain   Hypertension Father    Hypertension Sister    Arthritis Sister    Hypertension Brother    Arthritis Brother    Hypertension Brother    Arthritis Brother    Hypertension Brother    Hypertension Brother    Hypertension Brother    Hypertension Brother    Other Brother        MVA   Colon cancer Neg Hx     Social History   Socioeconomic History   Marital status: Widowed    Spouse name: Not on file   Number of children: 2   Years of education: 10   Highest education level: Not on file  Occupational History   Occupation: Producer, 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   Social Drivers of Health   Financial Resource Strain: Low Risk  (05/31/2020)   [facility]   Overall Financial Resource Strain (CARDIA)    Difficulty of Paying Living Expenses: Not hard at all  Food Insecurity: No Food Insecurity (05/31/2020)   Received from  [facility]   Hunger Vital Sign    Within the past 12 months, you worried that your food would run out before you got the money to buy more.: Never true    Within the past 12 months, the food you bought just didn't last and you didn't have money to get more.: Never true  Transportation Needs: No Transportation Needs (05/31/2020)   Received from [facility] - Transportation    Lack of Transportation (Medical): No    Lack  of Transportation (Non-Medical): No  Physical Activity: Insufficiently Active (05/31/2020)   Received from [facility]   Exercise Vital Sign    On average, how many days per week do you engage in moderate to strenuous exercise (like a brisk walk)?: 4 days    On average, how many minutes do you engage in exercise at this level?: 20 min  Stress: Stress Concern Present (05/31/2020)   Received from [facility] - Occupational Stress Questionnaire    Feeling of Stress : To some extent  Social Connections: Unknown (05/06/2021)   Received from [facility]   Social Network    Social Network: Not on file  Intimate Partner Violence: Unknown (04/06/2021)   Received from [facility]   HITS    Physically Hurt: Not on file    Insult or Talk Down To: Not on file    Threaten Physical Harm: Not on file    Scream or Curse: Not on file    Review of Systems   See HPI, otherwise negative ROS  Physical Exam: BP (!) 162/72   Pulse 80   Temp 98.6 F (37 C) (Oral)   Resp 15   Ht 5' 6 (1.676 m)   Wt 89.4 kg   LMP 01/02/2004 (Approximate)   SpO2 97%   BMI 31.80 kg/m  General:   Alert,  Well-developed, well-nourished, pleasant and cooperative in NAD Neck:  Supple; no masses or thyromegaly. No significant cervical adenopathy. Lungs:  Clear throughout to auscultation.   No wheezes, crackles, or rhonchi. No acute distress. Heart:  Regular rate and rhythm; no murmurs, clicks, rubs,  or gallops. Abdomen: Non-distended, normal  bowel sounds.  Soft and nontender without appreciable mass or hepatosplenomegaly.   Impression/Plan:    73 year old lady with a history of multiple colonic adenomas removed over multiple colonoscopies here for surveillance colonoscopy.  The risks, benefits, limitations, alternatives and imponderables have been reviewed with the patient. Questions have been answered. All parties are agreeable.      Notice: This dictation was prepared with Dragon dictation along with smaller phrase technology. Any transcriptional errors that result from this process are unintentional and may not be corrected upon review.

## 2023-10-24 NOTE — Op Note (Signed)
 [facility] Patient Name: Alexandra Hoffman Procedure Date: 10/24/2023 10:58 AM MRN: [id number] Date of Birth: [birth year] Attending MD: Alexandra Hoffman , MD, [id number] CSN: [id number] Age: 73 Admit Type: Outpatient Procedure:                Colonoscopy Indications:              High risk colon cancer surveillance: Personal                            history of colonic polyps Providers:                Alexandra Ozell Hollingshead, MD, Leandrew Edelman RN, RN, Bascom Blush Referring MD:              Medicines:                Propofol  per Anesthesia Complications:            No immediate complications. Estimated Blood Loss:     Estimated blood loss was minimal. Procedure:                Pre-Anesthesia Assessment:                           - Prior to the procedure, a History and Physical                            was performed, and patient medications and                            allergies were reviewed. The patient's tolerance of                            previous anesthesia was also reviewed. The risks                            and benefits of the procedure and the sedation                            options and risks were discussed with the patient.                            All questions were answered, and informed consent                            was obtained. Prior Anticoagulants: The patient has                            taken no anticoagulant or antiplatelet agents. ASA                            Grade Assessment: II - A patient with mild systemic  disease. After reviewing the risks and benefits,                            the patient was deemed in satisfactory condition to                            undergo the procedure.                           After obtaining informed consent, the colonoscope                            was passed under direct vision. Throughout the                            procedure, the patient's  blood pressure, pulse, and                            oxygen saturations were monitored continuously. The                            [id number] ([id number]) Colon was introduced through                            the anus and advanced to the the cecum, identified                            by appendiceal orifice and ileocecal valve. The                            colonoscopy was performed without difficulty. The                            patient tolerated the procedure well. The quality                            of the bowel preparation was adequate. The entire                            colon was examined. The colonoscopy was performed                            without difficulty. Scope In: 12:02:34 PM Scope Out: 12:18:52 PM Scope Withdrawal Time: 0 hours 8 minutes 42 seconds  Total Procedure Duration: 0 hours 16 minutes 18 seconds  Findings:      The perianal and digital rectal examinations were normal.      Scattered medium-mouthed diverticula were found in the left colon.      A 5 mm polyp was found in the ascending colon. The polyp was sessile.       The polyp was removed with a cold snare. Resection and retrieval were       complete. Estimated blood loss was minimal. Colonic anastomosis       identified at 20 cm from the anal verge. 2 cm soft submucosal area  in       the ascending colon. Positive pillow sign.      The exam was otherwise without abnormality on direct and retroflexion       views. Impression:               - Diverticulosis in the left colon.                           - One 5 mm polyp in the ascending colon, removed                            with a cold snare. Resected and retrieved. Surgical                            anastomosis 20 cm                           - The examination was otherwise normal on direct                            and retroflexion views. Colonic lipoma.                           It is notable patient's left side abdominal pain                             much better now that she is on Linzess  daily,                            evacuating better. Left adnexal mass followed by                            OB/GYN. Moderate Sedation:      Moderate (conscious) sedation was personally administered by an       anesthesia professional. The following parameters were monitored: oxygen       saturation, heart rate, blood pressure, respiratory rate, EKG, adequacy       of pulmonary ventilation, and response to care. Recommendation:           - Patient has a contact number available for                            emergencies. The signs and symptoms of potential                            delayed complications were discussed with the                            patient. Return to normal activities tomorrow.                            Written discharge instructions were provided to the                            patient.                           -  Advance diet as tolerated.                           - Continue present medications.                           - Repeat colonoscopy date to be determined after                            pending pathology results are reviewed for                            surveillance.                           - Return to GI office (date not yet determined). Procedure Code(s):        --- Professional ---                           [phone], Colonoscopy, flexible; with removal of                            tumor(s), polyp(s), or other lesion(s) by snare                            technique Diagnosis Code(s):        --- Professional ---                           Z86.010, Personal history of colonic polyps                           D12.2, Benign neoplasm of ascending colon                           K57.30, Diverticulosis of large intestine without                            perforation or abscess without bleeding CPT copyright 2022 [employer]. All rights reserved. The codes documented in this report are  preliminary and upon coder review may  be revised to meet current compliance requirements. Alexandra HERO. Jovaughn Wojtaszek, MD Alexandra Ozell Hollingshead, MD 10/24/2023 12:37:01 PM This report has been signed electronically. Number of Addenda: 0

## 2023-10-24 NOTE — Transfer of Care (Signed)
 Immediate Anesthesia Transfer of Care Note  Patient: Alexandra Hoffman  Procedure(s) Performed: COLONOSCOPY  Patient Location: Endoscopy Unit  Anesthesia Type:General  Level of Consciousness: awake, alert , oriented, and patient cooperative  Airway & Oxygen Therapy: Patient Spontanous Breathing  Post-op Assessment: Report given to RN, Post -op Vital signs reviewed and stable, and Patient moving all extremities X 4  Post vital signs: Reviewed and stable  Last Vitals:  Vitals Value Taken Time  BP 147/56 10/24/23 12:24  Temp 36.6 C 10/24/23 12:24  Pulse 64 10/24/23 12:24  Resp 19 10/24/23 12:24  SpO2 97 % 10/24/23 12:24    Last Pain:  Vitals:   10/24/23 1224  TempSrc: Oral  PainSc: 0-No pain      Patients Stated Pain Goal: 5 (10/24/23 1006)  Complications: No notable events documented.

## 2023-10-25 ENCOUNTER — Encounter (HOSPITAL_COMMUNITY): Payer: Self-pay | Admitting: Internal Medicine

## 2023-10-25 LAB — SURGICAL PATHOLOGY

## 2023-10-25 NOTE — Anesthesia Postprocedure Evaluation (Signed)
 Anesthesia Post Note  Patient: Alexandra Hoffman  Procedure(s) Performed: COLONOSCOPY  Patient location during evaluation: Phase II Anesthesia Type: General Level of consciousness: awake Pain management: pain level controlled Vital Signs Assessment: post-procedure vital signs reviewed and stable Respiratory status: spontaneous breathing and respiratory function stable Cardiovascular status: blood pressure returned to baseline and stable Postop Assessment: no headache and no apparent nausea or vomiting Anesthetic complications: no Comments: Late entry   No notable events documented.   Last Vitals:  Vitals:   10/24/23 1006 10/24/23 1224  BP: (!) 162/72 (!) 147/56  Pulse: 80 64  Resp: 15 19  Temp: 37 C 36.6 C  SpO2: 97% 97%    Last Pain:  Vitals:   10/24/23 1224  TempSrc: Oral  PainSc: 0-No pain                 Yvonna JINNY Bosworth

## 2023-10-27 ENCOUNTER — Ambulatory Visit: Payer: Self-pay | Admitting: Internal Medicine

## 2023-10-29 DIAGNOSIS — I10 Essential (primary) hypertension: Secondary | ICD-10-CM | POA: Diagnosis not present

## 2023-10-29 DIAGNOSIS — I129 Hypertensive chronic kidney disease with stage 1 through stage 4 chronic kidney disease, or unspecified chronic kidney disease: Secondary | ICD-10-CM | POA: Diagnosis not present

## 2023-10-29 DIAGNOSIS — N1831 Chronic kidney disease, stage 3a: Secondary | ICD-10-CM | POA: Diagnosis not present

## 2023-10-29 DIAGNOSIS — Z79899 Other long term (current) drug therapy: Secondary | ICD-10-CM | POA: Diagnosis not present

## 2023-10-29 DIAGNOSIS — J302 Other seasonal allergic rhinitis: Secondary | ICD-10-CM | POA: Diagnosis not present

## 2023-10-29 DIAGNOSIS — D472 Monoclonal gammopathy: Secondary | ICD-10-CM | POA: Diagnosis not present

## 2023-10-29 DIAGNOSIS — Z713 Dietary counseling and surveillance: Secondary | ICD-10-CM | POA: Diagnosis not present

## 2023-10-29 DIAGNOSIS — J019 Acute sinusitis, unspecified: Secondary | ICD-10-CM | POA: Diagnosis not present

## 2023-12-11 NOTE — Progress Notes (Signed)
 [facility] - Clinic Note  12/12/2023     CHIEF COMPLAINT Patient presents for Retina Follow Up   HISTORY OF PRESENT ILLNESS: Alexandra Hoffman is a 73 y.o. female who presents to the clinic today for:   HPI     Retina Follow Up   Patient presents with  Other.  In both eyes.  This started 6 months ago.  Duration of 6 months.  I, the attending physician,  performed the HPI with the patient and updated documentation appropriately.        Comments   Patient feels the vision has not changed. She is using AT's. Pt states she seen her PCP a couple of months ago for a broken blood vessel OS and they gave her Ofloxacin to use QID and she is still using.       Last edited by Valdemar Rogue, MD on 12/15/2023  8:51 PM.     Pt states she is still using antibiotic gtts after broken blood vessel in OS from a few months ago.  Referring physician: Oneil Kawasaki, DO [address]  [place], [place] [id number]  HISTORICAL INFORMATION:   Selected notes from the MEDICAL RECORD NUMBER Referred by Dr. Kawasaki LEE: 01/09/2021 Ocular Hx- Cataracts, DES OU, RD OS    CURRENT MEDICATIONS: Current Outpatient Medications (Ophthalmic Drugs)  Medication Sig   ofloxacin (OCUFLOX) 0.3 % ophthalmic solution    carboxymethylcellulose (REFRESH PLUS) 0.5 % SOLN Place 1 drop into both eyes 2 (two) times daily as needed (dry eyes).   No current facility-administered medications for this visit. (Ophthalmic Drugs)   Current Outpatient Medications (Other)  Medication Sig   ALPRAZolam (XANAX) 0.5 MG tablet Take 0.5 mg by mouth 2 (two) times daily as needed for anxiety.   amLODipine (NORVASC) 5 MG tablet Take 5 mg by mouth daily.   cholecalciferol (VITAMIN D3) 25 MCG (1000 UNIT) tablet Take 1,000 Units by mouth daily.   DULoxetine  (CYMBALTA ) 30 MG capsule Take 1 capsule (30 mg total) by mouth daily.   gabapentin (NEURONTIN) 100 MG capsule Take 100 mg by mouth 2 (two) times daily.    levocetirizine (XYZAL) 5 MG tablet Take 5 mg by mouth at bedtime.   LINZESS  72 MCG capsule TAKE ONE CAPSULE BY MOUTH ONCE DAILY WITH BREAKFAST.   lisinopril (PRINIVIL,ZESTRIL) 10 MG tablet Take 10 mg by mouth in the morning and at bedtime.   Omega-3 Fatty Acids (FISH OIL) 1200 MG CAPS Take 1,200 mg by mouth in the morning.   pantoprazole  (PROTONIX ) 40 MG tablet TAKE ONE TABLET BY MOUTH ONCE DAILY BEFORE BREAKFAST.   Sod Picosulfate-Mag Ox-Cit Acd (CLENPIQ ) 10-3.5-12 MG-GM -GM/175ML SOLN Take 1 kit by mouth as directed.   vitamin B-12 (CYANOCOBALAMIN ) 1000 MCG tablet Take 1,000 mcg by mouth in the morning.   No current facility-administered medications for this visit. (Other)   REVIEW OF SYSTEMS:     ALLERGIES No Known Allergies  PAST MEDICAL HISTORY Past Medical History:  Diagnosis Date   Acute sinusitis 01/16/2021   Acute upper respiratory infection 05/08/2022   Anemia    In childhood   Anxiety    Arthritis    knees   Constipation    Depression    Epidermoid cyst of skin of thigh 11/10/2020   GERD (gastroesophageal reflux disease)    Hyperlipidemia 06/12/2020   Hypertension    Osteoarthritis of both knees 02/06/2016   Pain in both upper arms 03/02/2021   Pain in throat 08/29/2021  Sore throat 09/12/2022   Weakness 03/23/2021   Past Surgical History:  Procedure Laterality Date   COLON SURGERY  2006   perforated sigmoid diverticulitis, Dr Mavis   COLONOSCOPY  2006   Dr. Shaaron: normal rectum and a few sigmoid diverticula, 7 mm angry pedunculated polyp at 30 cm, 4 mm sessile polyp base of cecum. Unknown path    COLONOSCOPY N/A 10/24/2023   Procedure: COLONOSCOPY;  Surgeon: Shaaron Lamar HERO, MD;  Location: [facility];  Service: Endoscopy;  Laterality: N/A;  11:15 am, asa 2   COLONOSCOPY WITH PROPOFOL  N/A 03/04/2017   Procedure: COLONOSCOPY WITH PROPOFOL ;  Surgeon: Shaaron Lamar HERO, MD;  Location: [facility];  Service: Endoscopy;  Laterality: N/A;  7:30am    COLONOSCOPY WITH PROPOFOL  N/A 04/07/2020   Procedure: COLONOSCOPY WITH PROPOFOL ;  Surgeon: Shaaron Lamar HERO, MD;  Location: [facility];  Service: Endoscopy;  Laterality: N/A;  AM (wants early as possible due to nausea)   ESOPHAGOGASTRODUODENOSCOPY (EGD) WITH PROPOFOL  N/A 03/19/2019   Rourk: Gastric polyps (biopsy showed fundic gland), small hiatal hernia.   POLYPECTOMY  03/04/2017   Procedure: POLYPECTOMY;  Surgeon: Shaaron Lamar HERO, MD;  Location: [facility];  Service: Endoscopy;;  polyp at cecum and left colon   POLYPECTOMY  03/19/2019   Procedure: POLYPECTOMY;  Surgeon: Shaaron Lamar HERO, MD;  Location: [facility];  Service: Endoscopy;;  gastric   POLYPECTOMY  04/07/2020   Procedure: POLYPECTOMY;  Surgeon: Shaaron Lamar HERO, MD;  Location: [facility];  Service: Endoscopy;;   REFRACTIVE SURGERY Left    TOTAL KNEE ARTHROPLASTY Left 01/22/2022   Procedure: LEFT TOTAL KNEE ARTHROPLASTY;  Surgeon: Edna Toribio LABOR, MD;  Location: [facility];  Service: Orthopedics;  Laterality: Left;   TOTAL KNEE ARTHROPLASTY Right 02/11/2023   Procedure: TOTAL KNEE ARTHROPLASTY;  Surgeon: Edna Toribio LABOR, MD;  Location: [facility];  Service: Orthopedics;  Laterality: Right;   FAMILY HISTORY Family History  Problem Relation Age of Onset   Arthritis Mother    Depression Mother    Diabetes Mother    Heart disease Mother    Hypertension Mother    Stroke Mother    Kidney disease Mother 70       dialysis   Alcohol  abuse Father    Arthritis Father    Cancer Father 50       brain   Hypertension Father    Hypertension Sister    Arthritis Sister    Hypertension Brother    Arthritis Brother    Hypertension Brother    Arthritis Brother    Hypertension Brother    Hypertension Brother    Hypertension Brother    Hypertension Brother    Other Brother        MVA   Colon cancer Neg Hx    SOCIAL HISTORY Social History   Tobacco Use   Smoking status: Never   Smokeless tobacco: Never  Vaping Use    Vaping status: Never Used  Substance Use Topics   Alcohol  use: No   Drug use: No       OPHTHALMIC EXAM:  Base Eye Exam     Visual Acuity (Snellen - Linear)       Right Left   Dist cc 20/25 -2 20/20   Dist ph cc 20/20 -2     Correction: Glasses         Tonometry (Tonopen, 8:44 AM)       Right Left   Pressure 16 13  Pupils       Pupils Dark Light Shape React APD   Right PERRL 3 2 Round Brisk None   Left PERRL 3 2 Round Brisk None         Visual Fields       Left Right    Full Full         Extraocular Movement       Right Left    Full, Ortho Full, Ortho         Neuro/Psych     Oriented x3: Yes   Mood/Affect: Normal         Dilation     Both eyes: 1.0% Mydriacyl, 2.5% Phenylephrine  @ 8:44 AM           Slit Lamp and Fundus Exam     Slit Lamp Exam       Right Left   Lids/Lashes Dermatochalasis - upper lid, Meibomian gland dysfunction Dermatochalasis - upper lid, mild MGD   Conjunctiva/Sclera nasal and temporal pinguecula small temporal pinguecula   Cornea arcus, trace Punctate epithelial erosions, tear film debris arcus, trace Punctate epithelial erosions, mild tear film debris   Anterior Chamber deep, clear, narrow angles deep, clear, narrow angles   Iris Round and dilated Round and dilated   Lens 2+ Nuclear sclerosis, 2-3+ Cortical cataract 2+ Nuclear sclerosis, 2-3+ Cortical cataract   Anterior Vitreous Mild syneresis, PVD, Boyd Ring, no pigment Mild Vitreous syneresis, PVD, Weiss ring, vitreous condensations - improved         Fundus Exam       Right Left   Disc Pink and Sharp Pink and Sharp, mild tilt   C/D Ratio 0.5 0.3   Macula Flat, good foveal reflex, RPE mottling, No heme or edema Flat, good foveal reflex, mild RPE mottling, No heme or edema   Vessels mild tortuosity attenuated, Tortuous, mild copper wiring   Periphery focal retinal tear at 1100 with SRF -- focal RD -- good laser changes from 0900-1130, focal  shallow schisis cavity at 0730, no outer retinal hole, mild reticular degeneration, pigmented cystoid degeneration, no new RT/RD or lattice Attached, bullous schisis cavity from 0100-0200 periphery, shallow schisis cavity IT periphery w/ peripheral outer retinal hole -- good laser changes surrounding, reticular degeneration, no new RT/RD or lattice           Refraction     Wearing Rx       Sphere Cylinder Axis Add   Right +1.25 +0.25 060 +2.25   Left +1.25 Sphere  +2.25           IMAGING AND PROCEDURES  Imaging and Procedures for 12/12/2023  OCT, Retina - OU - Both Eyes       Right Eye Quality was good. Central Foveal Thickness: 258. Progression has been stable. Findings include normal foveal contour, no IRF, no SRF (Stable improvement in vit opacities; Shallow SRF ST periphery caught on widefield - not imaged today).   Left Eye Quality was good. Central Foveal Thickness: 267. Progression has been stable. Findings include normal foveal contour, no IRF, no SRF, subretinal fluid, vitreomacular adhesion (vitreous opacities - stably improved, bullous retinoschisis ST and IT periphery caught on widefield ).   Notes *Images captured and stored on drive  Diagnosis / Impression:  OD: Stable improvement in vit opacities; Shallow SRF ST periphery caught on widefield - not imaged today OS: vitreous opacities - improved, bullous retinoschisis ST and IT periphery caught on widefield   Clinical management:  See below  Abbreviations: NFP - Normal foveal profile. CME - cystoid macular edema. PED - pigment epithelial detachment. IRF - intraretinal fluid. SRF - subretinal fluid. EZ - ellipsoid zone. ERM - epiretinal membrane. ORA - outer retinal atrophy. ORT - outer retinal tubulation. SRHM - subretinal hyper-reflective material. IRHM - intraretinal hyper-reflective material            ASSESSMENT/PLAN:    ICD-10-CM   1. Bilateral retinoschisis  H33.103 OCT, Retina - OU - Both Eyes     2. Retinal hole of both eyes  H33.323     3. Right retinal detachment  H33.21     4. Posterior vitreous detachment of both eyes  H43.813     5. Essential hypertension  I10     6. Hypertensive retinopathy of both eyes  H35.033     7. Combined forms of age-related cataract of both eyes  H25.813      1. Retinoschisis OU  - OD w/ shallow schisis cavity IT periphery - OS w/ bullous retinoschisis ST periphery (0100-0200) and shallow schisis IT periphery w/ peripheral outer retinal hole  - asymptomatic, BCVA 20/20 OU  - OS lesion found during routine exam by Dr. Darroll - s/p laser retinopexy OS (01.13.23) to patches of schisis -- good laser changes in place surrounding schisis  - no treatment recommended for shallow schisis OD at this time -- monitor  - f/u in 9 mos, sooner prn -- DFE/OCT  2,3. Retinal hole w/ focal retinal detachment OD - new retinal hole at 11 w/ mild heme and surrounding shallow SRF / RD from 09-1128 noted 4.7.23  - asymptomatic  - s/p laser retinopexy OD 04.07.23 -- good laser changes surrounding - no new RT/RD OD - monitor  4. PVD / vitreous syneresis OU  - acute symptomatic FOL and floater OD--onset 01/06/22  - acute symptomatic flashes / floaters OS -- onset 06.04.23  - Discussed findings and prognosis  - No RT or RD on 360 scleral depressed exam OU  - Reviewed s/s of RT/RD  - Strict return precautions for any such RT/RD signs/symptoms  - monitor  5,6. Hypertensive retinopathy OU - discussed importance of tight BP control - monitor  7. Mixed Cataract OU - The symptoms of cataract, surgical options, and treatments and risks were discussed with patient. - discussed diagnosis and progression - monitor  Ophthalmic Meds Ordered this visit:  No orders of the defined types were placed in this encounter.    Return in about 9 months (around 09/11/2024) for Schisis OU , DFE, OCT.  There are no Patient Instructions on file for this visit.   Explained the  diagnoses, plan, and follow up with the patient and they expressed understanding.  Patient expressed understanding of the importance of proper follow up care.   This document serves as a record of services personally performed by Redell JUDITHANN Hans, MD, PhD. It was created on their behalf by Auston Muzzy, COMT. The creation of this record is the provider's dictation and/or activities during the visit.  Electronically signed by: Auston Muzzy, COMT 12/15/2023 8:52 PM  This document serves as a record of services personally performed by Redell JUDITHANN Hans, MD, PhD. It was created on their behalf by Almetta Pesa, an ophthalmic technician. The creation of this record is the provider's dictation and/or activities during the visit.    Electronically signed by: Almetta Pesa, OA, 12/15/2023  8:52 PM  Redell JUDITHANN Hans, M.D., Ph.D. Diseases & Surgery of the Retina and Vitreous [facility]  I have reviewed the above documentation for accuracy and completeness, and I agree with the above. Redell JUDITHANN Hans, M.D., Ph.D. 12/15/2023 8:57 PM   Abbreviations: M myopia (nearsighted); A astigmatism; H hyperopia (farsighted); P presbyopia; Mrx spectacle prescription;  CTL contact lenses; OD right eye; OS left eye; OU both eyes  XT exotropia; ET esotropia; PEK punctate epithelial keratitis; PEE punctate epithelial erosions; DES dry eye syndrome; MGD meibomian gland dysfunction; ATs artificial tears; PFAT's preservative free artificial tears; NSC nuclear sclerotic cataract; PSC posterior subcapsular cataract; ERM epi-retinal membrane; PVD posterior vitreous detachment; RD retinal detachment; DM diabetes mellitus; DR diabetic retinopathy; NPDR non-proliferative diabetic retinopathy; PDR proliferative diabetic retinopathy; CSME clinically significant macular edema; DME diabetic macular edema; dbh dot blot hemorrhages; CWS cotton wool spot; POAG primary open angle glaucoma; C/D cup-to-disc ratio; HVF  humphrey visual field; GVF goldmann visual field; OCT optical coherence tomography; IOP intraocular pressure; BRVO Branch retinal vein occlusion; CRVO central retinal vein occlusion; CRAO central retinal artery occlusion; BRAO branch retinal artery occlusion; RT retinal tear; SB scleral buckle; PPV pars plana vitrectomy; VH Vitreous hemorrhage; PRP panretinal laser photocoagulation; IVK intravitreal kenalog ; VMT vitreomacular traction; MH Macular hole;  NVD neovascularization of the disc; NVE neovascularization elsewhere; AREDS age related eye disease study; ARMD age related macular degeneration; POAG primary open angle glaucoma; EBMD epithelial/anterior basement membrane dystrophy; ACIOL anterior chamber intraocular lens; IOL intraocular lens; PCIOL posterior chamber intraocular lens; Phaco/IOL phacoemulsification with intraocular lens placement; PRK photorefractive keratectomy; LASIK laser assisted in situ keratomileusis; HTN hypertension; DM diabetes mellitus; COPD chronic obstructive pulmonary disease

## 2023-12-12 ENCOUNTER — Encounter (INDEPENDENT_AMBULATORY_CARE_PROVIDER_SITE_OTHER): Payer: Self-pay | Admitting: Ophthalmology

## 2023-12-12 ENCOUNTER — Ambulatory Visit (INDEPENDENT_AMBULATORY_CARE_PROVIDER_SITE_OTHER): Admitting: Ophthalmology

## 2023-12-12 DIAGNOSIS — H35033 Hypertensive retinopathy, bilateral: Secondary | ICD-10-CM | POA: Diagnosis not present

## 2023-12-12 DIAGNOSIS — H3321 Serous retinal detachment, right eye: Secondary | ICD-10-CM

## 2023-12-12 DIAGNOSIS — H33103 Unspecified retinoschisis, bilateral: Secondary | ICD-10-CM | POA: Diagnosis not present

## 2023-12-12 DIAGNOSIS — H25813 Combined forms of age-related cataract, bilateral: Secondary | ICD-10-CM | POA: Diagnosis not present

## 2023-12-12 DIAGNOSIS — H43813 Vitreous degeneration, bilateral: Secondary | ICD-10-CM | POA: Diagnosis not present

## 2023-12-12 DIAGNOSIS — I10 Essential (primary) hypertension: Secondary | ICD-10-CM

## 2023-12-12 DIAGNOSIS — H33323 Round hole, bilateral: Secondary | ICD-10-CM | POA: Diagnosis not present

## 2023-12-13 ENCOUNTER — Encounter (INDEPENDENT_AMBULATORY_CARE_PROVIDER_SITE_OTHER): Admitting: Ophthalmology

## 2023-12-15 ENCOUNTER — Encounter (INDEPENDENT_AMBULATORY_CARE_PROVIDER_SITE_OTHER): Payer: Self-pay | Admitting: Ophthalmology

## 2024-02-20 ENCOUNTER — Inpatient Hospital Stay

## 2024-02-26 ENCOUNTER — Inpatient Hospital Stay: Admitting: Oncology

## 2024-02-27 ENCOUNTER — Ambulatory Visit: Admitting: Oncology

## 2024-02-27 ENCOUNTER — Inpatient Hospital Stay: Admitting: Oncology

## 2024-09-11 ENCOUNTER — Encounter (INDEPENDENT_AMBULATORY_CARE_PROVIDER_SITE_OTHER): Admitting: Ophthalmology
